# Patient Record
Sex: Female | Born: 1935
Health system: Southern US, Community
[De-identification: ages and names within clinical notes are randomized; demographics above are authoritative.]

## PROBLEM LIST (undated history)

## (undated) DIAGNOSIS — I10 Essential (primary) hypertension: Secondary | ICD-10-CM

## (undated) DIAGNOSIS — S52502A Unspecified fracture of the lower end of left radius, initial encounter for closed fracture: Secondary | ICD-10-CM

## (undated) DIAGNOSIS — H409 Unspecified glaucoma: Secondary | ICD-10-CM

## (undated) DIAGNOSIS — G629 Polyneuropathy, unspecified: Secondary | ICD-10-CM

## (undated) DIAGNOSIS — C50919 Malignant neoplasm of unspecified site of unspecified female breast: Secondary | ICD-10-CM

## (undated) DIAGNOSIS — F0280 Dementia in other diseases classified elsewhere without behavioral disturbance: Secondary | ICD-10-CM

## (undated) DIAGNOSIS — S42202A Unspecified fracture of upper end of left humerus, initial encounter for closed fracture: Secondary | ICD-10-CM

## (undated) DIAGNOSIS — E119 Type 2 diabetes mellitus without complications: Secondary | ICD-10-CM

## (undated) HISTORY — DX: Dementia in other diseases classified elsewhere, unspecified severity, without behavioral disturbance, psychotic disturbance, mood disturbance, and anxiety: F02.80

## (undated) HISTORY — DX: Polyneuropathy, unspecified: G62.9

## (undated) HISTORY — PX: OTHER SURGICAL HISTORY: SHX169

## (undated) HISTORY — DX: Type 2 diabetes mellitus without complications: E11.9

## (undated) HISTORY — PX: MASTECTOMY: SHX3

---
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
## 1999-01-24 ENCOUNTER — Ambulatory Visit (HOSPITAL_COMMUNITY): Admission: RE | Admit: 1999-01-24 | Discharge: 1999-01-24 | Payer: Self-pay

## 1999-02-15 ENCOUNTER — Ambulatory Visit (HOSPITAL_COMMUNITY): Admission: RE | Admit: 1999-02-15 | Discharge: 1999-02-15 | Payer: Self-pay

## 1999-03-25 ENCOUNTER — Ambulatory Visit (HOSPITAL_COMMUNITY): Admission: RE | Admit: 1999-03-25 | Discharge: 1999-03-26 | Payer: Self-pay

## 1999-03-25 ENCOUNTER — Encounter (INDEPENDENT_AMBULATORY_CARE_PROVIDER_SITE_OTHER): Payer: Self-pay | Admitting: Specialist

## 2001-09-27 ENCOUNTER — Ambulatory Visit (HOSPITAL_COMMUNITY): Admission: RE | Admit: 2001-09-27 | Discharge: 2001-09-27 | Payer: Self-pay | Admitting: Orthopedic Surgery

## 2001-09-27 ENCOUNTER — Encounter: Payer: Self-pay | Admitting: Orthopedic Surgery

## 2001-10-22 ENCOUNTER — Ambulatory Visit (HOSPITAL_COMMUNITY): Admission: RE | Admit: 2001-10-22 | Discharge: 2001-10-22 | Payer: Self-pay | Admitting: Orthopedic Surgery

## 2001-10-22 ENCOUNTER — Encounter: Payer: Self-pay | Admitting: Orthopedic Surgery

## 2001-11-26 ENCOUNTER — Encounter (INDEPENDENT_AMBULATORY_CARE_PROVIDER_SITE_OTHER): Payer: Self-pay | Admitting: Specialist

## 2001-11-26 ENCOUNTER — Ambulatory Visit (HOSPITAL_COMMUNITY): Admission: RE | Admit: 2001-11-26 | Discharge: 2001-11-26 | Payer: Self-pay | Admitting: *Deleted

## 2007-07-09 ENCOUNTER — Emergency Department (HOSPITAL_COMMUNITY): Admission: EM | Admit: 2007-07-09 | Discharge: 2007-07-09 | Payer: Self-pay | Admitting: Emergency Medicine

## 2007-08-07 ENCOUNTER — Inpatient Hospital Stay (HOSPITAL_COMMUNITY): Admission: AD | Admit: 2007-08-07 | Discharge: 2007-08-12 | Payer: Self-pay | Admitting: Internal Medicine

## 2008-03-20 ENCOUNTER — Encounter: Admission: RE | Admit: 2008-03-20 | Discharge: 2008-03-20 | Payer: Self-pay | Admitting: Internal Medicine

## 2010-01-25 ENCOUNTER — Encounter: Admission: RE | Admit: 2010-01-25 | Discharge: 2010-01-25 | Payer: Self-pay | Admitting: Internal Medicine

## 2010-08-31 NOTE — Discharge Summary (Signed)
Vickie Young, DELALUZ NO.:  [id number]   MEDICAL RECORD NO.:  [id number]          PATIENT TYPE:  INP   LOCATION:  5501                         FACILITY:  [facility]   PHYSICIAN:  Hillery Aldo, M.D.   DATE OF BIRTH:  January 29, 1924   DATE OF ADMISSION:  08/07/2007  DATE OF DISCHARGE:  08/12/2007                               DISCHARGE SUMMARY   PRIMARY CARE PHYSICIAN:  Dr. Merri Brunette.   GASTROENTEROLOGIST:  Dr. Virginia Rochester.   DISCHARGE DIAGNOSES:  1. Urinary tract infection with sepsis from a urinary source      accompanied by fever, hypotension, acute renal failure, and acute      liver injury.  2. Transaminitis.  3. Acute renal failure.  4. Hyponatremia.  5. Normocytic anemia of chronic disease.  6. Hypokalemia.  7. Diabetes mellitus.  8. History of hypertension.   DISCHARGE MEDICATIONS:  1. Cipro 250 mg b.i.d. x3 more days.  2. Metformin 1000 mg b.i.d.  3. Alphagan 0.2% 1 drop in each eye q.12 h.  4. Cosopt 0.5% 1 drop in each eye q.12 h.  5. Tums 500 mg p.r.n.  6. Multivitamin daily.  7. Micardis 80/12.5 1 tablet daily.  8. Aspirin 81 mg daily.  9. Caduet 10/20 1 tablet daily.  10.Protonix 40 mg daily.  11.Phenergan 25 mg q.6 h. p.r.n.   CONSULTATION:  Dr. Virginia Rochester of gastroenterology.   PROCEDURES AND DIAGNOSTIC STUDIES:  1. Chest x-ray on August 07, 2007, showed chronic lung changes with no      acute process.  2. Abdominal ultrasound on August 08, 2007, showed a 2.2 cm size      echogenic area within the liver adjacent to the porta hepatis.  It      is felt that this likely was representative of the small hemangioma      or small area of focal fatty change, for a bilateral small, simple-      appearing renal cyst.  3. CT scan of the abdomen on August 09, 2007, showed no evidence of      liver mass.  Echogenic area seen on ultrasound was prominent      periportal fat.  Multiple small liver and bilateral renal cysts.      No acute abnormalities.   DISCHARGE  LABORATORY VALUES:  Sodium was 136, potassium 3.9, chloride  104, bicarb 23, BUN 4, creatinine 0.87, and glucose 119.  Total  bilirubin 2.0, alkaline phosphatase 68, AST 133, and ALT 130.   HOSPITAL COURSE:  1. Fever, sepsis from a urinary source with hypotension, acute renal      failure, and acute liver injury:  The patient was admitted with      symptoms of generalized weakness and fever.  Upon general      laboratory screening, she was found to have a significant      transaminitis.  A septic workup was undertaken with chest x-ray,      and urinalysis with culture.  She was not immediately started on      antibiotics due to lack of localizing symptoms.  Blood cultures  were subsequently negative.  Urine cultures were obtained after the      patient received a dose of antibiotics and were sterile.  However,      a urinalysis with microscopy done on admission did show pyuria and      significant bacteriuria.  The patient was given judicious IV fluid      hydration and was empirically started on Rocephin after spiking a      fever to 102.6.  She defervesced and her blood pressure stabilized.      Her transaminitis and her renal function improved dramatically.      She was transitioned over to p.o. Cipro and will complete a full      week's course of therapy given her severity of illness and      concurrent diabetes.  2. Acute liver injury:  The patient's acute liver injury was felt to      be due to shock liver.  Nevertheless, Dr. Virginia Rochester was consulted due to      the significant elevation of her transaminases.  Her AST reached a      high of 569 and her ALT reached a high of 254.  Viral hepatitis      studies were negative.  An ANA was sent to rule out autoimmune      hepatitis and is pending at the time of this dictation, but due to      the fact that her LFTs are significantly improved, this is not felt      to be due to autoimmune hepatitis.  Nevertheless, she should have a       repeat check of her liver function studies as an outpatient to      ensure that these have completely normalized.  Additionally, a mono      spot screen was negative, ammonia studies were not elevated, and a      GGT was only mildly elevated at 54.  Her coagulation studies were      normal.  3. Acute renal failure secondary to dehydration, hypotension and      shock:  The patient received judicious IV fluid rehydration.  Her      creatinine was 1.56 on admission and has normalized to 0.87 at      discharge.  4. Hyponatremia:  The patient's sodium balance normalized at the time      of discharge.  5. Normocytic anemia:  The patient had fecal occult blood testing      which was negative.  She had an anemia panel done and the findings      were consistent with anemia of chronic disease.  Specifically, her      iron level was 33 with a total iron binding capacity of 237.      Vitamin B12 was 550, serum folate greater than 20, and ferritin      3044.  6. Hypokalemia:  The patient was appropriately repleted.   DISPOSITION:  The patient is medically stable and will be discharged  home today.  Again, she should follow up with her primary care physician  for a recheck of her liver function studies next week.  She should  follow up with Dr. Virginia Rochester for routine GI issues.      Hillery Aldo, M.D.  Electronically Signed     CR/MEDQ  D:  08/12/2007  T:  08/13/2007  Job:  [id number]   cc:   Soyla Murphy. Renne Crigler, M.D.  Georgiana Spinner, M.D.

## 2010-08-31 NOTE — H&P (Signed)
NAMESHANDRICKA, MONROY NO.:  [id number]   MEDICAL RECORD NO.:  [id number]          PATIENT TYPE:  INP   LOCATION:  5501                         FACILITY:  [facility]   PHYSICIAN:  Hillery Aldo, M.D.   DATE OF BIRTH:  12-Jul-1923   DATE OF ADMISSION:  08/07/2007  DATE OF DISCHARGE:                              HISTORY & PHYSICAL   PRIMARY CARE PHYSICIAN:  Dr. Renne Crigler   CHIEF COMPLAINT:  Weakness, fever, nausea, vomiting.   HISTORY OF PRESENT ILLNESS:  The patient is an 78 year old female sent  from her primary care physician's office today secondary to weakness,  recent onset of fever, nausea, vomiting, and dehydration.  The patient  reports a 3- to 4-day history of intermittent shaking chills and fever.  She also has had intermittent nausea and vomiting.  She thinks she may  have had some diarrhea, but did not specifically look in the toilet.  Denies any dyspnea or cough.  There has not been any abdominal cramping.  There has not been any dysuria.  She denies any sick contacts.  She was  admitted for further evaluation and observation.   PAST MEDICAL HISTORY:  1. Parathyroid adenoma on the left status post resection secondary to      hyperparathyroidism.  2. Hypertension.  3. Diabetes.  4. History of breast cancer status post mastectomy.  5. Cataracts.  6. Hearing impaired.  7. Status post hysterectomy.  8. Glaucoma.   CURRENT MEDICATIONS:  1. Caduet 10/20 1 tablet daily.  2. Metformin 1000 mg b.i.d.  3. Micardis 80/12.5 daily.  4. Aspirin 81 mg daily.  5. Combigan 1 drop both eyes b.i.d.  6. Phenergan 25 mg q.4h. p.r.n.  7. Multivitamin daily.  8. Tums p.r.n.  9. Protonix 40 mg daily.   ALLERGIES:  NOVOCAINE.   SOCIAL HISTORY:  The patient is widowed and currently lives with one of  her daughters.  She ambulates independently.  She is independent of  ADLs.  She has a remote history of tobacco use, quit over 30 years ago.  Denies alcohol use.   FAMILY HISTORY:  The patient's mother died of coronary artery disease.  The patient's father died of stroke.   REVIEW OF SYSTEMS:  As noted in the elements of the HPI above, otherwise  negative.   PHYSICAL EXAM:  VITAL SIGHS: Temperature 98.7, blood pressure 97/59,  pulse 74, respirations 18, O2 saturation 92% on room air.  GENERAL: This is a well-developed, well-nourished female, in no acute  distress.  HEENT: Normocephalic and atraumatic.  PERRL.  EOMI.  Oropharynx reveals  dry mucous membranes.  NECK: Supple, no thyromegaly, no lymphadenopathy, jugular venous  distention.  CHEST: Lungs clear to auscultation bilaterally with good air movement.  HEART:  Regular rate, rhythm.  No murmurs, rubs, or gallops.  ABDOMEN:  Soft, nontender, nondistended with normoactive bowel sounds.  EXTREMITIES:  No clubbing, edema, or cyanosis.  SKIN:  Warm and dry.  No rashes.  NEUROLOGIC:  The patient is hearing impaired, but otherwise cranial  nerves II through XII are grossly intact.  Moves all extremities x4 with  equal  strength.  Nonfocal.   DATA REVIEW:  White blood cell count is 5.4, hemoglobin 10.9, hematocrit  32.7, platelets 176 with an MCV of 84.5.  Sodium is 130, potassium 3.9,  chloride 95, bicarb 26, BUN 22, creatinine 1.56, glucose 191.  Total  bilirubin 1.1, alkaline phosphatase 68, AST 247, ALT 111, total protein  7, albumin 3.4.   Chest x-ray is pending.   ASSESSMENT/PLAN:  1. Weakness febrile illness:  The patient will be admitted and blood      cultures x2 will be obtained for any recurrent fevers.  Urinalysis      has been sent.  Chest x-ray is pending.  This likely is just a      simple viral gastroenteritis, but she does have elevation of her      liver function studies, and therefore, we will add a lipase, and a      right upper quadrant ultrasound and hepatitis studies to her      evaluation.  2. Dehydration/acute renal failure/hyponatremia:  The patient is      likely  prerenal and suffering from mild dehydration.  We will      hydrate her with normal saline.  Follow her electrolytes closely as      well as her renal function closely.  3. Diabetes:  We will initiate sliding scale insulin q.a.c. and      nightly.  We will check hemoglobin A1c values to determine her      overall glycemic control.  4. Hypertension:  The patient is currently hypotensive.  We will hold      all of her blood pressure medications for now.  5. Cataract/glaucoma:  Continue the patient's usual eye drop therapy.  6. Normocytic anemia:  This is mild, but her hemoglobin may drop      further with rehydration.  We will monitor her hemoglobin and      hematocrit and heme-check her stools as well as check an anemia      panel.  7. Prophylaxis:  Initiate PAS for deep venous thrombosis prophylaxis      and Protonix for GI prophylaxis.      Hillery Aldo, M.D.  Electronically Signed     CR/MEDQ  D:  08/07/2007  T:  08/08/2007  Job:  [id number]

## 2010-09-03 NOTE — Op Note (Signed)
   Vickie Young, Vickie Young                       ACCOUNT NO.:  [id number]   MEDICAL RECORD NO.:  [id number]                   PATIENT TYPE:  AMB   LOCATION:  ENDO                                 FACILITY:  [facility]   PHYSICIAN:  Georgiana Spinner, M.D.                 DATE OF BIRTH:  Jun 29, 1923   DATE OF PROCEDURE:  11/26/2001  DATE OF DISCHARGE:  11/26/2001                                 OPERATIVE REPORT   PROCEDURE:  Colonoscopy.   INDICATIONS:  Colon cancer screening.   ANESTHESIA:  Versed 1 mg.   DESCRIPTION OF PROCEDURE:  With the patient mildly sedated in the left  lateral decubitus position, the Olympus videoscopic colonoscope was inserted  in the rectum, advanced under direct vision to the cecum, identified by the  ileocecal valve and appendiceal orifice, both of which were photographed.  From this point the colonoscope was slowly withdrawn, taking circumferential  views of the entire colonic mucosa, stopping only in the rectum, which  appeared normal on direct and retroflex view.  The endoscope was  straightened and withdrawn.  The patient's vital signs and pulse oximetry  remained stable.  The patient tolerated the procedure well with no apparent  complications.   FINDINGS:  Unremarkable colonoscopic examination.   PLAN:  See endoscopy note for further details.                                               Georgiana Spinner, M.D.    GMO/MEDQ  D:  11/26/2001  T:  11/28/2001  Job:  [id number]

## 2010-09-03 NOTE — Op Note (Signed)
   Vickie Young, Vickie Young                       ACCOUNT NO.:  [id number]   MEDICAL RECORD NO.:  [id number]                   PATIENT TYPE:  AMB   LOCATION:  ENDO                                 FACILITY:  [facility]   PHYSICIAN:  Georgiana Spinner, M.D.                 DATE OF BIRTH:  [birth year]   DATE OF PROCEDURE:  DATE OF DISCHARGE:  11/26/2001                                 OPERATIVE REPORT   PROCEDURE PERFORMED:  Upper endoscopy   INDICATIONS FOR PROCEDURE:  GERD.   ANESTHESIA:  Demerol 40, Versed 4 mg   DESCRIPTION OF PROCEDURE:  With the patient mildly sedated in the left  lateral decubitus position, the Olympus videoscopic endoscope was inserted  into the mouth and passed under direct vision through the esophagus which  appeared normal into the stomach through a hiatal hernia.  The fundus, body,  antrum, duodenal bulb and second portion of the duodenum were negative.  From this point, the endoscope was slightly withdrawn taking circumferential  views of the duodenal mucosa until the endoscope then pulled back into the  stomach, placed in retroflexion to view the stomach from below.  The  endoscope was straightened and withdrawn.  Taking circumferential views of  the remaining gastric and esophageal mucosa.  The patient's vital signs and  pulse oximetry remained stable.  The patient tolerated the procedure well  without apparent complications following this hiatal hernia; otherwise  unremarkable exam.   PLAN:  Proceed to colonoscopy.                                                Georgiana Spinner, M.D.    GMO/MEDQ  D:  11/26/2001  T:  11/28/2001  Job:  [id number]

## 2010-09-03 NOTE — Op Note (Signed)
[place]  Patient:    Vickie Young                     MRN: [id number] Proc. Date: 03/25/99 Adm. Date:  [id number] Attending:  Gennie Alma CC:         Soyla Murphy. Renne Crigler, M.D.                           Operative Report  CCS NUMBER:  [id number].  PREOPERATIVE DIAGNOSIS:  Parathyroid adenoma of the left superior gland.  POSTOPERATIVE DIAGNOSIS:  Parathyroid adenoma of the left superior gland.  OPERATION:  Nuclear mapping of parathyroid glands and bilateral exploration of he neck with resection of the parathyroid adenoma of the left superior gland.  SURGEON:  Milus Mallick, M.D.  ASSISTANTS: 1. Abigail Miyamoto, M.D. 2. Patricia Pesa, M.D.  INDICATION:  This 77 year old patient had symptomatic primary hyperparathyroidism and was known to have a serum parathyroid hormone level of 139 (normal being 12 to 72) with a calcium of 11.2 mg%.  She had undergone a MRI scan of her neck which suggested that she had a parathyroid adenoma of her right inferior gland.  She also had undergone ______ scan of the  neck that suggested that she had a parathyroid adenoma of the left superior gland. With these ambiguous findings, it was thought that nuclear mapping of the neck would help to discern where this lesion really was.  The patient had received injection of ______ agent approximately two hours prior to surgery.  DESCRIPTION OF PROCEDURE:  Under adequate general endotracheal anesthesia and with the neck extended on a shoulder roll, the neck was prepared and draped in the usual fashion.  Nuclear mapping of the parathyroid gland was carried out using the gamma neoprobe. The findings with the probe were not very indicative of the adenoma as in usual  cases.  The background counts were approximately 300 to 350 and the counts in the left upper quadrant were 350 to 400.  With the ambiguous findings of the MRI and the ______  scan, it  was felt prudent to explore both sides of the neck.  For this reason, a low collar incision was made.  Bleeders were electrocoagulated.  The platysma muscle was divided. Superior and inferior skin flaps were developed subjacent to the platysma muscle.  The neural cervical fascia was exposed and opened longitudinally in the midline. The surgical plane of the thyroid was entered.  The left side was approached first nd the normal left inferior parathyroid gland was found.  The superior thyroid gland was not initially identified.  We then explored the right thyroid lobe and the strap muscles were retracted. There was a normal-appearing right parathyroid gland associated with the inferior pole of the right lobe of the thyroid.  The right superior gland was not found.  The right recurrent laryngeal nerve was identified and the inferior thyroid artery. The superior mediastinum was well explored and there was no evidence of parathyroid adenoma in that area although there was thymus gland that might have been the source of the findings on the previous MRI scan.  We once again did nuclear mapping of the neck and this time we did find a modest increase in activity over the left superior pole of the thyroid gland. Dissection posterior and lateral identified the superior parathyroid adenoma that was somewhat intrathyroidal.  It was dissected out of the thyroid gland  over small hemoclips and in some instances large hemoclips.  The left middle thyroid vein was divided over clips and the left inferior thyroid artery and left recurrent laryngeal nerve were all identified.  Specimen was excised and was approximately 1.8 by 1.0 by 0.2 cm.  It was sent for frozen section study which confirmed parathyroid hyperplasia.  Hemostasis was ascertained.  The strap muscles were repaired in the midline with interrupted sutures of 4-0 Vicryl.  The platysma muscle was reapproximated with  interrupted  sutures of 4-0 Vicryl and the skin was approximated with generic skin stapler 35-W.  Sterile dressings were applied.  ESTIMATED BLOOD LOSS:  Negligible.  COMPLICATIONS:  The patient tolerated the procedure well and left the operating  room in satisfactory condition. DD:  03/25/99 TD:  03/26/99 Job: [id number]

## 2011-01-11 LAB — AMMONIA: Ammonia: 44 — ABNORMAL HIGH

## 2011-01-11 LAB — MONONUCLEOSIS SCREEN: Mono Screen: NEGATIVE

## 2011-01-11 LAB — CBC
HCT: 25.7 — ABNORMAL LOW
HCT: 32.7 — ABNORMAL LOW
Hemoglobin: 10.5 — ABNORMAL LOW
Hemoglobin: 10.9 — ABNORMAL LOW
Hemoglobin: 9.4 — ABNORMAL LOW
MCHC: 33.2
Platelets: 240
RBC: 3.05 — ABNORMAL LOW
RBC: 3.34 — ABNORMAL LOW
RDW: 12.2
RDW: 12.3
WBC: 5.4
WBC: 6.2

## 2011-01-11 LAB — COMPREHENSIVE METABOLIC PANEL
ALT: 111 — ABNORMAL HIGH
ALT: 131 — ABNORMAL HIGH
ALT: 247 — ABNORMAL HIGH
ALT: 254 — ABNORMAL HIGH
AST: 226 — ABNORMAL HIGH
Albumin: 2.7 — ABNORMAL LOW
Albumin: 3.4 — ABNORMAL LOW
Alkaline Phosphatase: 58
Alkaline Phosphatase: 61
Alkaline Phosphatase: 68
Alkaline Phosphatase: 68
BUN: 7
CO2: 23
CO2: 24
Calcium: 9.1
Chloride: 104
Chloride: 104
Chloride: 95 — ABNORMAL LOW
Chloride: 98
GFR calc non Af Amer: 44 — ABNORMAL LOW
GFR calc non Af Amer: >60
Glucose, Bld: 119 — ABNORMAL HIGH
Glucose, Bld: 157 — ABNORMAL HIGH
Glucose, Bld: 173 — ABNORMAL HIGH
Glucose, Bld: 191 — ABNORMAL HIGH
Potassium: 3.2 — ABNORMAL LOW
Potassium: 3.9
Potassium: 3.9
Potassium: 4.1
Sodium: 130 — ABNORMAL LOW
Sodium: 131 — ABNORMAL LOW
Sodium: 132 — ABNORMAL LOW
Sodium: 136
Total Bilirubin: 1.1
Total Bilirubin: 1.7 — ABNORMAL HIGH
Total Protein: 6.8
Total Protein: 7

## 2011-01-11 LAB — HEPATITIS PANEL, ACUTE
HCV Ab: NEGATIVE
Hepatitis B Surface Ag: NEGATIVE

## 2011-01-11 LAB — URINE MICROSCOPIC-ADD ON

## 2011-01-11 LAB — FOLATE: Folate: >20

## 2011-01-11 LAB — URINALYSIS, ROUTINE W REFLEX MICROSCOPIC
Ketones, ur: NEGATIVE
Nitrite: NEGATIVE
Specific Gravity, Urine: 1.019
pH: 5

## 2011-01-11 LAB — DIFFERENTIAL
Basophils Absolute: 0
Basophils Relative: 0
Eosinophils Absolute: 0
Monocytes Absolute: 0.3
Monocytes Relative: 5
Neutrophils Relative %: 68

## 2011-01-11 LAB — STOOL CULTURE

## 2011-01-11 LAB — CMV ABS, IGG+IGM (CYTOMEGALOVIRUS): CMV IgM: <0.9 Index (ref <0.90)

## 2011-01-11 LAB — ANA: Anti Nuclear Antibody(ANA): NEGATIVE

## 2011-01-11 LAB — URINE CULTURE: Culture: NO GROWTH

## 2011-01-11 LAB — CULTURE, BLOOD (ROUTINE X 2)

## 2011-01-11 LAB — GAMMA GT: GGT: 54 — ABNORMAL HIGH

## 2011-01-11 LAB — FERRITIN: Ferritin: 3044 — ABNORMAL HIGH (ref 10–291)

## 2011-01-11 LAB — RETICULOCYTES: Retic Count, Absolute: 24.1

## 2011-05-26 DIAGNOSIS — E119 Type 2 diabetes mellitus without complications: Secondary | ICD-10-CM | POA: Diagnosis not present

## 2011-05-26 DIAGNOSIS — E7800 Pure hypercholesterolemia, unspecified: Secondary | ICD-10-CM | POA: Diagnosis not present

## 2011-05-30 DIAGNOSIS — E119 Type 2 diabetes mellitus without complications: Secondary | ICD-10-CM | POA: Diagnosis not present

## 2011-05-30 DIAGNOSIS — E7800 Pure hypercholesterolemia, unspecified: Secondary | ICD-10-CM | POA: Diagnosis not present

## 2011-05-30 DIAGNOSIS — I10 Essential (primary) hypertension: Secondary | ICD-10-CM | POA: Diagnosis not present

## 2011-07-25 DIAGNOSIS — H409 Unspecified glaucoma: Secondary | ICD-10-CM | POA: Diagnosis not present

## 2011-07-25 DIAGNOSIS — H4011X0 Primary open-angle glaucoma, stage unspecified: Secondary | ICD-10-CM | POA: Diagnosis not present

## 2011-08-25 DIAGNOSIS — E119 Type 2 diabetes mellitus without complications: Secondary | ICD-10-CM | POA: Diagnosis not present

## 2011-08-25 DIAGNOSIS — E7800 Pure hypercholesterolemia, unspecified: Secondary | ICD-10-CM | POA: Diagnosis not present

## 2011-08-29 DIAGNOSIS — I10 Essential (primary) hypertension: Secondary | ICD-10-CM | POA: Diagnosis not present

## 2011-08-29 DIAGNOSIS — E119 Type 2 diabetes mellitus without complications: Secondary | ICD-10-CM | POA: Diagnosis not present

## 2011-08-29 DIAGNOSIS — E7800 Pure hypercholesterolemia, unspecified: Secondary | ICD-10-CM | POA: Diagnosis not present

## 2011-12-09 DIAGNOSIS — E119 Type 2 diabetes mellitus without complications: Secondary | ICD-10-CM | POA: Diagnosis not present

## 2011-12-09 DIAGNOSIS — E7800 Pure hypercholesterolemia, unspecified: Secondary | ICD-10-CM | POA: Diagnosis not present

## 2011-12-13 DIAGNOSIS — E7800 Pure hypercholesterolemia, unspecified: Secondary | ICD-10-CM | POA: Diagnosis not present

## 2011-12-13 DIAGNOSIS — I10 Essential (primary) hypertension: Secondary | ICD-10-CM | POA: Diagnosis not present

## 2011-12-13 DIAGNOSIS — E1149 Type 2 diabetes mellitus with other diabetic neurological complication: Secondary | ICD-10-CM | POA: Diagnosis not present

## 2011-12-15 DIAGNOSIS — H4011X0 Primary open-angle glaucoma, stage unspecified: Secondary | ICD-10-CM | POA: Diagnosis not present

## 2012-01-11 DIAGNOSIS — E7800 Pure hypercholesterolemia, unspecified: Secondary | ICD-10-CM | POA: Diagnosis not present

## 2012-01-11 DIAGNOSIS — E1149 Type 2 diabetes mellitus with other diabetic neurological complication: Secondary | ICD-10-CM | POA: Diagnosis not present

## 2012-01-11 DIAGNOSIS — I10 Essential (primary) hypertension: Secondary | ICD-10-CM | POA: Diagnosis not present

## 2012-01-11 DIAGNOSIS — N390 Urinary tract infection, site not specified: Secondary | ICD-10-CM | POA: Diagnosis not present

## 2012-01-11 DIAGNOSIS — Z0000 Encounter for general adult medical examination without abnormal findings: Secondary | ICD-10-CM | POA: Diagnosis not present

## 2012-01-16 DIAGNOSIS — Z0000 Encounter for general adult medical examination without abnormal findings: Secondary | ICD-10-CM | POA: Diagnosis not present

## 2012-01-16 DIAGNOSIS — I10 Essential (primary) hypertension: Secondary | ICD-10-CM | POA: Diagnosis not present

## 2012-01-16 DIAGNOSIS — Z23 Encounter for immunization: Secondary | ICD-10-CM | POA: Diagnosis not present

## 2012-01-16 DIAGNOSIS — H409 Unspecified glaucoma: Secondary | ICD-10-CM | POA: Diagnosis not present

## 2012-01-16 DIAGNOSIS — E1149 Type 2 diabetes mellitus with other diabetic neurological complication: Secondary | ICD-10-CM | POA: Diagnosis not present

## 2012-01-16 DIAGNOSIS — E7800 Pure hypercholesterolemia, unspecified: Secondary | ICD-10-CM | POA: Diagnosis not present

## 2012-01-25 DIAGNOSIS — R21 Rash and other nonspecific skin eruption: Secondary | ICD-10-CM | POA: Diagnosis not present

## 2012-01-25 DIAGNOSIS — R5381 Other malaise: Secondary | ICD-10-CM | POA: Diagnosis not present

## 2012-01-25 DIAGNOSIS — R509 Fever, unspecified: Secondary | ICD-10-CM | POA: Diagnosis not present

## 2012-01-25 DIAGNOSIS — L299 Pruritus, unspecified: Secondary | ICD-10-CM | POA: Diagnosis not present

## 2012-01-25 DIAGNOSIS — R5383 Other fatigue: Secondary | ICD-10-CM | POA: Diagnosis not present

## 2012-03-05 DIAGNOSIS — E119 Type 2 diabetes mellitus without complications: Secondary | ICD-10-CM | POA: Diagnosis not present

## 2012-03-05 DIAGNOSIS — R609 Edema, unspecified: Secondary | ICD-10-CM | POA: Diagnosis not present

## 2012-03-05 DIAGNOSIS — I10 Essential (primary) hypertension: Secondary | ICD-10-CM | POA: Diagnosis not present

## 2012-03-05 DIAGNOSIS — H9190 Unspecified hearing loss, unspecified ear: Secondary | ICD-10-CM | POA: Diagnosis not present

## 2012-03-05 DIAGNOSIS — R5381 Other malaise: Secondary | ICD-10-CM | POA: Diagnosis not present

## 2012-03-23 DIAGNOSIS — E7800 Pure hypercholesterolemia, unspecified: Secondary | ICD-10-CM | POA: Diagnosis not present

## 2012-03-23 DIAGNOSIS — E1149 Type 2 diabetes mellitus with other diabetic neurological complication: Secondary | ICD-10-CM | POA: Diagnosis not present

## 2012-03-29 DIAGNOSIS — E1149 Type 2 diabetes mellitus with other diabetic neurological complication: Secondary | ICD-10-CM | POA: Diagnosis not present

## 2012-03-29 DIAGNOSIS — E7800 Pure hypercholesterolemia, unspecified: Secondary | ICD-10-CM | POA: Diagnosis not present

## 2012-03-29 DIAGNOSIS — I10 Essential (primary) hypertension: Secondary | ICD-10-CM | POA: Diagnosis not present

## 2012-04-12 DIAGNOSIS — H43819 Vitreous degeneration, unspecified eye: Secondary | ICD-10-CM | POA: Diagnosis not present

## 2012-04-12 DIAGNOSIS — H524 Presbyopia: Secondary | ICD-10-CM | POA: Diagnosis not present

## 2012-04-12 DIAGNOSIS — Z961 Presence of intraocular lens: Secondary | ICD-10-CM | POA: Diagnosis not present

## 2012-04-12 DIAGNOSIS — H4011X0 Primary open-angle glaucoma, stage unspecified: Secondary | ICD-10-CM | POA: Diagnosis not present

## 2012-07-02 DIAGNOSIS — E1149 Type 2 diabetes mellitus with other diabetic neurological complication: Secondary | ICD-10-CM | POA: Diagnosis not present

## 2012-08-02 DIAGNOSIS — E1149 Type 2 diabetes mellitus with other diabetic neurological complication: Secondary | ICD-10-CM | POA: Diagnosis not present

## 2012-08-06 DIAGNOSIS — E1149 Type 2 diabetes mellitus with other diabetic neurological complication: Secondary | ICD-10-CM | POA: Diagnosis not present

## 2012-08-06 DIAGNOSIS — E7800 Pure hypercholesterolemia, unspecified: Secondary | ICD-10-CM | POA: Diagnosis not present

## 2012-08-06 DIAGNOSIS — I10 Essential (primary) hypertension: Secondary | ICD-10-CM | POA: Diagnosis not present

## 2012-08-09 DIAGNOSIS — H409 Unspecified glaucoma: Secondary | ICD-10-CM | POA: Diagnosis not present

## 2012-08-09 DIAGNOSIS — H4011X0 Primary open-angle glaucoma, stage unspecified: Secondary | ICD-10-CM | POA: Diagnosis not present

## 2012-10-04 DIAGNOSIS — E1149 Type 2 diabetes mellitus with other diabetic neurological complication: Secondary | ICD-10-CM | POA: Diagnosis not present

## 2012-10-04 DIAGNOSIS — E7800 Pure hypercholesterolemia, unspecified: Secondary | ICD-10-CM | POA: Diagnosis not present

## 2012-10-04 DIAGNOSIS — I10 Essential (primary) hypertension: Secondary | ICD-10-CM | POA: Diagnosis not present

## 2012-10-08 DIAGNOSIS — E119 Type 2 diabetes mellitus without complications: Secondary | ICD-10-CM | POA: Diagnosis not present

## 2012-10-08 DIAGNOSIS — E7800 Pure hypercholesterolemia, unspecified: Secondary | ICD-10-CM | POA: Diagnosis not present

## 2012-10-08 DIAGNOSIS — I10 Essential (primary) hypertension: Secondary | ICD-10-CM | POA: Diagnosis not present

## 2012-10-08 DIAGNOSIS — E1149 Type 2 diabetes mellitus with other diabetic neurological complication: Secondary | ICD-10-CM | POA: Diagnosis not present

## 2012-10-08 DIAGNOSIS — Z006 Encounter for examination for normal comparison and control in clinical research program: Secondary | ICD-10-CM | POA: Diagnosis not present

## 2013-01-08 DIAGNOSIS — E1149 Type 2 diabetes mellitus with other diabetic neurological complication: Secondary | ICD-10-CM | POA: Diagnosis not present

## 2013-01-08 DIAGNOSIS — E7800 Pure hypercholesterolemia, unspecified: Secondary | ICD-10-CM | POA: Diagnosis not present

## 2013-01-10 DIAGNOSIS — E1149 Type 2 diabetes mellitus with other diabetic neurological complication: Secondary | ICD-10-CM | POA: Diagnosis not present

## 2013-01-10 DIAGNOSIS — E7800 Pure hypercholesterolemia, unspecified: Secondary | ICD-10-CM | POA: Diagnosis not present

## 2013-01-10 DIAGNOSIS — I10 Essential (primary) hypertension: Secondary | ICD-10-CM | POA: Diagnosis not present

## 2013-01-10 DIAGNOSIS — Z23 Encounter for immunization: Secondary | ICD-10-CM | POA: Diagnosis not present

## 2013-01-16 DIAGNOSIS — I10 Essential (primary) hypertension: Secondary | ICD-10-CM | POA: Diagnosis not present

## 2013-01-22 DIAGNOSIS — Z0000 Encounter for general adult medical examination without abnormal findings: Secondary | ICD-10-CM | POA: Diagnosis not present

## 2013-01-22 DIAGNOSIS — Z853 Personal history of malignant neoplasm of breast: Secondary | ICD-10-CM | POA: Diagnosis not present

## 2013-01-22 DIAGNOSIS — I10 Essential (primary) hypertension: Secondary | ICD-10-CM | POA: Diagnosis not present

## 2013-01-22 DIAGNOSIS — E1149 Type 2 diabetes mellitus with other diabetic neurological complication: Secondary | ICD-10-CM | POA: Diagnosis not present

## 2013-01-22 DIAGNOSIS — E7800 Pure hypercholesterolemia, unspecified: Secondary | ICD-10-CM | POA: Diagnosis not present

## 2013-05-02 DIAGNOSIS — E7800 Pure hypercholesterolemia, unspecified: Secondary | ICD-10-CM | POA: Diagnosis not present

## 2013-05-02 DIAGNOSIS — E1149 Type 2 diabetes mellitus with other diabetic neurological complication: Secondary | ICD-10-CM | POA: Diagnosis not present

## 2013-05-07 DIAGNOSIS — E1149 Type 2 diabetes mellitus with other diabetic neurological complication: Secondary | ICD-10-CM | POA: Diagnosis not present

## 2013-05-07 DIAGNOSIS — I10 Essential (primary) hypertension: Secondary | ICD-10-CM | POA: Diagnosis not present

## 2013-05-07 DIAGNOSIS — E7800 Pure hypercholesterolemia, unspecified: Secondary | ICD-10-CM | POA: Diagnosis not present

## 2013-08-02 DIAGNOSIS — E7800 Pure hypercholesterolemia, unspecified: Secondary | ICD-10-CM | POA: Diagnosis not present

## 2013-08-02 DIAGNOSIS — E1149 Type 2 diabetes mellitus with other diabetic neurological complication: Secondary | ICD-10-CM | POA: Diagnosis not present

## 2013-08-06 DIAGNOSIS — I10 Essential (primary) hypertension: Secondary | ICD-10-CM | POA: Diagnosis not present

## 2013-08-06 DIAGNOSIS — E7800 Pure hypercholesterolemia, unspecified: Secondary | ICD-10-CM | POA: Diagnosis not present

## 2013-08-06 DIAGNOSIS — E1149 Type 2 diabetes mellitus with other diabetic neurological complication: Secondary | ICD-10-CM | POA: Diagnosis not present

## 2013-08-13 DIAGNOSIS — H4011X0 Primary open-angle glaucoma, stage unspecified: Secondary | ICD-10-CM | POA: Diagnosis not present

## 2013-08-13 DIAGNOSIS — H409 Unspecified glaucoma: Secondary | ICD-10-CM | POA: Diagnosis not present

## 2013-09-06 DIAGNOSIS — H409 Unspecified glaucoma: Secondary | ICD-10-CM | POA: Diagnosis not present

## 2013-09-06 DIAGNOSIS — H4011X0 Primary open-angle glaucoma, stage unspecified: Secondary | ICD-10-CM | POA: Diagnosis not present

## 2013-09-06 DIAGNOSIS — E119 Type 2 diabetes mellitus without complications: Secondary | ICD-10-CM | POA: Diagnosis not present

## 2013-09-06 DIAGNOSIS — H524 Presbyopia: Secondary | ICD-10-CM | POA: Diagnosis not present

## 2013-10-31 DIAGNOSIS — E1149 Type 2 diabetes mellitus with other diabetic neurological complication: Secondary | ICD-10-CM | POA: Diagnosis not present

## 2013-11-05 DIAGNOSIS — E119 Type 2 diabetes mellitus without complications: Secondary | ICD-10-CM | POA: Diagnosis not present

## 2013-11-05 DIAGNOSIS — I10 Essential (primary) hypertension: Secondary | ICD-10-CM | POA: Diagnosis not present

## 2013-11-05 DIAGNOSIS — E7800 Pure hypercholesterolemia, unspecified: Secondary | ICD-10-CM | POA: Diagnosis not present

## 2013-11-07 DIAGNOSIS — H4011X0 Primary open-angle glaucoma, stage unspecified: Secondary | ICD-10-CM | POA: Diagnosis not present

## 2013-11-07 DIAGNOSIS — H409 Unspecified glaucoma: Secondary | ICD-10-CM | POA: Diagnosis not present

## 2013-12-12 DIAGNOSIS — H4011X0 Primary open-angle glaucoma, stage unspecified: Secondary | ICD-10-CM | POA: Diagnosis not present

## 2014-02-04 DIAGNOSIS — E119 Type 2 diabetes mellitus without complications: Secondary | ICD-10-CM | POA: Diagnosis not present

## 2014-02-04 DIAGNOSIS — E780 Pure hypercholesterolemia: Secondary | ICD-10-CM | POA: Diagnosis not present

## 2014-02-04 DIAGNOSIS — I10 Essential (primary) hypertension: Secondary | ICD-10-CM | POA: Diagnosis not present

## 2014-02-06 DIAGNOSIS — I10 Essential (primary) hypertension: Secondary | ICD-10-CM | POA: Diagnosis not present

## 2014-02-06 DIAGNOSIS — E780 Pure hypercholesterolemia: Secondary | ICD-10-CM | POA: Diagnosis not present

## 2014-02-06 DIAGNOSIS — Z23 Encounter for immunization: Secondary | ICD-10-CM | POA: Diagnosis not present

## 2014-02-06 DIAGNOSIS — E119 Type 2 diabetes mellitus without complications: Secondary | ICD-10-CM | POA: Diagnosis not present

## 2014-02-11 DIAGNOSIS — H409 Unspecified glaucoma: Secondary | ICD-10-CM | POA: Diagnosis not present

## 2014-02-11 DIAGNOSIS — H4011X0 Primary open-angle glaucoma, stage unspecified: Secondary | ICD-10-CM | POA: Diagnosis not present

## 2014-02-14 DIAGNOSIS — L309 Dermatitis, unspecified: Secondary | ICD-10-CM | POA: Diagnosis not present

## 2014-02-14 DIAGNOSIS — I10 Essential (primary) hypertension: Secondary | ICD-10-CM | POA: Diagnosis not present

## 2014-02-14 DIAGNOSIS — Z0000 Encounter for general adult medical examination without abnormal findings: Secondary | ICD-10-CM | POA: Diagnosis not present

## 2014-02-14 DIAGNOSIS — E780 Pure hypercholesterolemia: Secondary | ICD-10-CM | POA: Diagnosis not present

## 2014-02-14 DIAGNOSIS — E119 Type 2 diabetes mellitus without complications: Secondary | ICD-10-CM | POA: Diagnosis not present

## 2014-02-14 DIAGNOSIS — E118 Type 2 diabetes mellitus with unspecified complications: Secondary | ICD-10-CM | POA: Diagnosis not present

## 2014-03-06 DIAGNOSIS — H4011X1 Primary open-angle glaucoma, mild stage: Secondary | ICD-10-CM | POA: Diagnosis not present

## 2014-03-06 DIAGNOSIS — H4011X3 Primary open-angle glaucoma, severe stage: Secondary | ICD-10-CM | POA: Diagnosis not present

## 2014-04-03 DIAGNOSIS — H4011X3 Primary open-angle glaucoma, severe stage: Secondary | ICD-10-CM | POA: Diagnosis not present

## 2014-04-03 DIAGNOSIS — H4011X1 Primary open-angle glaucoma, mild stage: Secondary | ICD-10-CM | POA: Diagnosis not present

## 2014-04-21 DIAGNOSIS — H4011X1 Primary open-angle glaucoma, mild stage: Secondary | ICD-10-CM | POA: Diagnosis not present

## 2014-05-22 DIAGNOSIS — E119 Type 2 diabetes mellitus without complications: Secondary | ICD-10-CM | POA: Diagnosis not present

## 2014-05-26 DIAGNOSIS — I10 Essential (primary) hypertension: Secondary | ICD-10-CM | POA: Diagnosis not present

## 2014-05-26 DIAGNOSIS — E559 Vitamin D deficiency, unspecified: Secondary | ICD-10-CM | POA: Diagnosis not present

## 2014-05-26 DIAGNOSIS — E119 Type 2 diabetes mellitus without complications: Secondary | ICD-10-CM | POA: Diagnosis not present

## 2014-05-26 DIAGNOSIS — E780 Pure hypercholesterolemia: Secondary | ICD-10-CM | POA: Diagnosis not present

## 2014-06-23 DIAGNOSIS — H4011X3 Primary open-angle glaucoma, severe stage: Secondary | ICD-10-CM | POA: Diagnosis not present

## 2014-06-23 DIAGNOSIS — H4011X1 Primary open-angle glaucoma, mild stage: Secondary | ICD-10-CM | POA: Diagnosis not present

## 2014-08-21 DIAGNOSIS — I10 Essential (primary) hypertension: Secondary | ICD-10-CM | POA: Diagnosis not present

## 2014-08-21 DIAGNOSIS — E559 Vitamin D deficiency, unspecified: Secondary | ICD-10-CM | POA: Diagnosis not present

## 2014-08-21 DIAGNOSIS — E119 Type 2 diabetes mellitus without complications: Secondary | ICD-10-CM | POA: Diagnosis not present

## 2014-08-21 DIAGNOSIS — E780 Pure hypercholesterolemia: Secondary | ICD-10-CM | POA: Diagnosis not present

## 2014-08-28 DIAGNOSIS — E780 Pure hypercholesterolemia: Secondary | ICD-10-CM | POA: Diagnosis not present

## 2014-08-28 DIAGNOSIS — E119 Type 2 diabetes mellitus without complications: Secondary | ICD-10-CM | POA: Diagnosis not present

## 2014-08-28 DIAGNOSIS — E559 Vitamin D deficiency, unspecified: Secondary | ICD-10-CM | POA: Diagnosis not present

## 2014-08-28 DIAGNOSIS — I10 Essential (primary) hypertension: Secondary | ICD-10-CM | POA: Diagnosis not present

## 2014-10-24 DIAGNOSIS — H43393 Other vitreous opacities, bilateral: Secondary | ICD-10-CM | POA: Diagnosis not present

## 2014-10-24 DIAGNOSIS — H35362 Drusen (degenerative) of macula, left eye: Secondary | ICD-10-CM | POA: Diagnosis not present

## 2014-10-24 DIAGNOSIS — H4011X3 Primary open-angle glaucoma, severe stage: Secondary | ICD-10-CM | POA: Diagnosis not present

## 2014-10-24 DIAGNOSIS — H4011X1 Primary open-angle glaucoma, mild stage: Secondary | ICD-10-CM | POA: Diagnosis not present

## 2014-10-24 DIAGNOSIS — E119 Type 2 diabetes mellitus without complications: Secondary | ICD-10-CM | POA: Diagnosis not present

## 2014-11-24 ENCOUNTER — Encounter: Payer: Self-pay | Admitting: Podiatry

## 2014-11-24 ENCOUNTER — Ambulatory Visit (INDEPENDENT_AMBULATORY_CARE_PROVIDER_SITE_OTHER): Payer: Medicare Other | Admitting: Podiatry

## 2014-11-24 ENCOUNTER — Ambulatory Visit (INDEPENDENT_AMBULATORY_CARE_PROVIDER_SITE_OTHER): Payer: Medicare Other

## 2014-11-24 VITALS — BP 146/92 | HR 86 | Resp 12

## 2014-11-24 DIAGNOSIS — R52 Pain, unspecified: Secondary | ICD-10-CM

## 2014-11-24 DIAGNOSIS — E1140 Type 2 diabetes mellitus with diabetic neuropathy, unspecified: Secondary | ICD-10-CM | POA: Diagnosis not present

## 2014-11-24 DIAGNOSIS — E1149 Type 2 diabetes mellitus with other diabetic neurological complication: Secondary | ICD-10-CM

## 2014-11-24 NOTE — Patient Instructions (Signed)
Diabetic Neuropathy Diabetic neuropathy is a nerve disease or nerve damage that is caused by diabetes mellitus. About half of all people with diabetes mellitus have some form of nerve damage. Nerve damage is more common in those who have had diabetes mellitus for many years and who generally have not had good control of their blood sugar (glucose) level. Diabetic neuropathy is a common complication of diabetes mellitus. There are three more common types of diabetic neuropathy and a fourth type that is less common and less understood:   Peripheral neuropathy--This is the most common type of diabetic neuropathy. It causes damage to the nerves of the feet and legs first and then eventually the hands and arms.The damage affects the ability to sense touch.  Autonomic neuropathy--This type causes damage to the autonomic nervous system, which controls the following functions:  Heartbeat.  Body temperature.  Blood pressure.  Urination.  Digestion.  Sweating.  Sexual function.  Focal neuropathy--Focal neuropathy can be painful and unpredictable and occurs most often in older adults with diabetes mellitus. It involves a specific nerve or one area and often comes on suddenly. It usually does not cause long-term problems.  Radiculoplexus neuropathy-- Sometimes called lumbosacral radiculoplexus neuropathy, radiculoplexus neuropathy affects the nerves of the thighs, hips, buttocks, or legs. It is more common in people with type 2 diabetes mellitus and in older men. It is characterized by debilitating pain, weakness, and atrophy, usually in the thigh muscles. CAUSES  The cause of peripheral, autonomic, and focal neuropathies is diabetes mellitus that is uncontrolled and high glucose levels. The cause of radiculoplexus neuropathy is unknown. However, it is thought to be caused by inflammation related to uncontrolled glucose levels. SIGNS AND SYMPTOMS  Peripheral Neuropathy Peripheral neuropathy develops  slowly over time. When the nerves of the feet and legs no longer work there may be:   Burning, stabbing, or aching pain in the legs or feet.  Inability to feel pressure or pain in your feet. This can lead to:  Thick calluses over pressure areas.  Pressure sores.  Ulcers.  Foot deformities.  Reduced ability to feel temperature changes.  Muscle weakness. Autonomic Neuropathy The symptoms of autonomic neuropathy vary depending on which nerves are affected. Symptoms may include:  Problems with digestion, such as:  Feeling sick to your stomach (nausea).  Vomiting.  Bloating.  Constipation.  Diarrhea.  Abdominal pain.  Difficulty with urination. This occurs if you lose your ability to sense when your bladder is full. Problems include:  Urine leakage (incontinence).  Inability to empty your bladder completely (retention).  Rapid or irregular heartbeat (palpitations).  Blood pressure drops when you stand up (orthostatic hypotension). When you stand up you may feel:  Dizzy.  Weak.  Faint.  In men, inability to attain and maintain an erection.  In women, vaginal dryness and problems with decreased sexual desire and arousal.  Problems with body temperature regulation.  Increased or decreased sweating. Focal Neuropathy  Abnormal eye movements or abnormal alignment of both eyes.  Weakness in the wrist.  Foot drop. This results in an inability to lift the foot properly and abnormal walking or foot movement.  Paralysis on one side of your face (Bell palsy).  Chest or abdominal pain. Radiculoplexus Neuropathy  Sudden, severe pain in your hip, thigh, or buttocks.  Weakness and wasting of thigh muscles.  Difficulty rising from a seated position.  Abdominal swelling.  Unexplained weight loss (usually more than 10 lb [4.5 kg]). DIAGNOSIS  Peripheral Neuropathy Your senses may   be tested. Sensory function testing can be done with:  A light touch using a  monofilament.  A vibration with tuning fork.  A sharp sensation with a pin prick. Other tests that can help diagnose neuropathy are:  Nerve conduction velocity. This test checks the transmission of an electrical current through a nerve.  Electromyography. This shows how muscles respond to electrical signals transmitted by nearby nerves.  Quantitative sensory testing. This is used to assess how your nerves respond to vibrations and changes in temperature. Autonomic Neuropathy Diagnosis is often based on reported symptoms. Tell your health care provider if you experience:   Dizziness.   Constipation.   Diarrhea.   Inappropriate urination or inability to urinate.   Inability to get or maintain an erection.  Tests that may be done include:   Electrocardiography or Holter monitor. These are tests that can help show problems with the heart rate or heart rhythm.   An X-ray exam may be done. Focal Neuropathy Diagnosis is made based on your symptoms and what your health care provider finds during your exam. Other tests may be done. They may include:  Nerve conduction velocities. This checks the transmission of electrical current through a nerve.  Electromyography. This shows how muscles respond to electrical signals transmitted by nearby nerves.  Quantitative sensory testing. This test is used to assess how your nerves respond to vibration and changes in temperature. Radiculoplexus Neuropathy  Often the first thing is to eliminate any other issue or problems that might be the cause, as there is no stick test for diagnosis.  X-ray exam of your spine and lumbar region.  Spinal tap to rule out cancer.  MRI to rule out other lesions. TREATMENT  Once nerve damage occurs, it cannot be reversed. The goal of treatment is to keep the disease or nerve damage from getting worse and affecting more nerve fibers. Controlling your blood glucose level is the key. Most people with  radiculoplexus neuropathy see at least a partial improvement over time. You will need to keep your blood glucose and HbA1c levels in the target range determined by your health care provider. Things that help control blood glucose levels include:   Blood glucose monitoring.   Meal planning.   Physical activity.   Diabetes medicine.  Over time, maintaining lower blood glucose levels helps lessen symptoms. Sometimes, prescription pain medicine is needed. HOME CARE INSTRUCTIONS:  Do not smoke.  Keep your blood glucose level in the range that you and your health care provider have determined acceptable for you.  Keep your blood pressure level in the range that you and your health care provider have determined acceptable for you.  Eat a well-balanced diet.  Be active every day.  Check your feet every day. SEEK MEDICAL CARE IF:   You have burning, stabbing, or aching pain in the legs or feet.  You are unable to feel pressure or pain in your feet.  You develop problems with digestion such as:  Nausea.  Vomiting.  Bloating.  Constipation.  Diarrhea.  Abdominal pain.  You have difficulty with urination, such as:  Incontinence.  Retention.  You have palpitations.  You develop orthostatic hypotension. When you stand up you may feel:  Dizzy.  Weak.  Faint.  You cannot attain and maintain an erection (in men).  You have vaginal dryness and problems with decreased sexual desire and arousal (in women).  You have severe pain in your thighs, legs, or buttocks.  You have unexplained weight loss.   Document Released: 06/13/2001 Document Revised: 01/23/2013 Document Reviewed: 09/13/2012 Exit[employer] Patient Information 2015 [employer]. This information is not intended to replace advice given to you by your health care provider. Make sure you discuss any questions you have with your health care provider. 

## 2014-11-24 NOTE — Progress Notes (Signed)
   Subjective:    Patient ID: Vickie Young, female    DOB: 09/30/1923, 81 y.o.   MRN: [id number]  HPI  81 year old female presents the office they with her daughter for consent of bilateral foot pain, stiffness, burning, numbness which is been ongoing for greater than 5 years. They state that the symptoms remain about the same has not been progressive. She denies any swelling or redness to her feet. She denies any history of injury or trauma. She said no prior treatment for this. She is diabetic and states her last blood sugar is 117. She does not have any claudication symptoms that she currently walks 1 mile per day 3 times a week without any problems. No other complaints at this time.  Review of Systems  All other systems reviewed and are negative.      Objective:   Physical Exam AAO 3, NAD DP/PT pulses palpable, CRT less than 3 seconds Protective sensation absent with Derrel Nip monofilament, absent vibratory sensation. There is no specific area pinpoint bony tenderness or pain with vibratory sensation. Ankle, subtalar, midtarsal joint range of motion is intact without any restriction. There is no overlying edema, erythema, increase in warmth. There is no open lesions or pre-ulcerative lesions identified bilaterally. There is no pain with calf compression, swelling, warmth, erythema. MMT 5/5, ROM WNL     Assessment & Plan:  81 year old female with peripheral neuropathy likely causing the majority of her symptoms.  -X-rays were obtained and reviewed with the patient.  -Treatment options discussed including all alternatives, risks, and complications -Discussed likely etiology of her symptoms -Discussed possible starting gabapentin or other medications for neuropathy seems to help her symptoms. The patients daughter had multiple concerns starting any new medications. I discussed with her that I'll talk to the patient's primary care physician before starting any treatment.    -Follow-up 6 weeks or sooner if any problems arise. In the meantime, encouraged to call the office with any questions, concerns, change in symptoms.   [name]  Patient's daugther is Vickie Young [phone]

## 2014-11-25 ENCOUNTER — Encounter: Payer: Self-pay | Admitting: *Deleted

## 2014-11-25 ENCOUNTER — Telehealth: Payer: Self-pay | Admitting: *Deleted

## 2014-11-25 DIAGNOSIS — E119 Type 2 diabetes mellitus without complications: Secondary | ICD-10-CM | POA: Diagnosis not present

## 2014-11-25 DIAGNOSIS — E780 Pure hypercholesterolemia: Secondary | ICD-10-CM | POA: Diagnosis not present

## 2014-11-25 DIAGNOSIS — I10 Essential (primary) hypertension: Secondary | ICD-10-CM | POA: Diagnosis not present

## 2014-11-25 NOTE — Telephone Encounter (Addendum)
-----   Message from Trula Slade, DPM sent at 11/24/2014  6:23 PM EDT ----- I saw this patient Monday and she was having symptoms consistent with neuropathy. On exam she has absent sensation with simms weinstein and vibratory sensation. I discussed starting gabapentin 100mg  at night and wean up as needed. The daughter has multiple concerns of starting a new medication without talking to the primary doctor. Can we call the PCP or send a letter asking if it is ok to start gabapentin? Thanks. I asked pt her primary doctor's name, Dr. [name] and I informed her we would be asking for his opinion concerning adding Gabapentin 100mg  at bedtime to her medication regimen.  I sent a letter and pt's last chart notes to Dr. [name], at [facility], [address] [place], [place] [address], concerning Gabapentin.  Pt's dtr, called stating the pt's doctor had not received a note concerning pt's addition of Gabapentin.  I left a message on Ms. Glenn's phone informing the letter was sent 11/25/2014 to Dr. Katrine Coho office by mail.  Sammie Bench, Doctor of Pharmaceuticals saw pt today in office and request last chart notes, and x-ray report to be faxed to [phone].  Done.  X-ray reports faxed to Dr. Brantley Persons nurse - Izora Gala.

## 2014-11-27 DIAGNOSIS — E119 Type 2 diabetes mellitus without complications: Secondary | ICD-10-CM | POA: Diagnosis not present

## 2014-11-27 DIAGNOSIS — E780 Pure hypercholesterolemia: Secondary | ICD-10-CM | POA: Diagnosis not present

## 2014-11-27 DIAGNOSIS — I10 Essential (primary) hypertension: Secondary | ICD-10-CM | POA: Diagnosis not present

## 2014-11-27 NOTE — Telephone Encounter (Signed)
They are dictated. Thanks

## 2014-12-03 ENCOUNTER — Telehealth: Payer: Self-pay | Admitting: Podiatry

## 2014-12-03 NOTE — Telephone Encounter (Signed)
I do not have any paperwork and do not see any documentation for Diabetic shoes I will forward to [name] to see if she may have received anything.

## 2014-12-03 NOTE — Telephone Encounter (Signed)
Val, this would have been faxed paperwork from an outside vendor.  I have nothing.

## 2014-12-03 NOTE — Telephone Encounter (Signed)
PTS PCP CALLED ASKING IF WE GOT THE AUTHORIZATION FOR PT TO GET DIABETIC SHOES. PTS DAUGHTER CALLED THERE OFFICE. THE OFFICE STATED THEY FAXED IT ON [phone].IF YOU COULD CALL DAUGHTER AND LET HER KNOW WHAT NEEDS TO BE DONE. IF YOU NEED ANYTHING FROM DR [facility] OFFICE THE # IS [phone] AND HIS NURSE IS SALLY.

## 2014-12-03 NOTE — Telephone Encounter (Signed)
Melody, I don't receive diabetic shoe anything other than pt calls.  Vickie Young.

## 2014-12-04 NOTE — Telephone Encounter (Signed)
Melody, neither do I!  So, I guess the vendor will need to be contacted, by whoever knows who the vendor is.  Marcy Siren

## 2014-12-08 NOTE — Telephone Encounter (Signed)
Tried to return call to [place] states Vickie Young is not Dr Lambert Mody nurse but in pharmacology.  She was not available left message for her to call me back so that we can get Ms. Solt what she is needing.

## 2014-12-08 NOTE — Telephone Encounter (Signed)
12/05/14 Tried to call [facility] Dr Lambert Mody nurse sat on hold for 12 minutes before I had to hang up.

## 2014-12-10 NOTE — Telephone Encounter (Signed)
Tried again to contact [facility] receptionist states that she is with a patient could try to help.  I told her that I was returning a call from [place] on 12/03/14 that I had researched the patients chart and do not see any mention of diabetic shoe orders by our Dr. And have not received any orders from their office.

## 2015-01-30 DIAGNOSIS — E1143 Type 2 diabetes mellitus with diabetic autonomic (poly)neuropathy: Secondary | ICD-10-CM | POA: Diagnosis not present

## 2015-01-30 DIAGNOSIS — E119 Type 2 diabetes mellitus without complications: Secondary | ICD-10-CM | POA: Diagnosis not present

## 2015-02-11 ENCOUNTER — Ambulatory Visit: Payer: Medicare Other | Admitting: *Deleted

## 2015-02-11 DIAGNOSIS — E1149 Type 2 diabetes mellitus with other diabetic neurological complication: Secondary | ICD-10-CM

## 2015-02-11 NOTE — Progress Notes (Signed)
Patient ID: Vickie Young, female   DOB: February 02, 1924, 81 y.o.   MRN: [id number] Patient presents to be scanned and measured for diabetic shoes and inserts.

## 2015-02-24 DIAGNOSIS — E7800 Pure hypercholesterolemia, unspecified: Secondary | ICD-10-CM | POA: Diagnosis not present

## 2015-02-24 DIAGNOSIS — I10 Essential (primary) hypertension: Secondary | ICD-10-CM | POA: Diagnosis not present

## 2015-02-24 DIAGNOSIS — E119 Type 2 diabetes mellitus without complications: Secondary | ICD-10-CM | POA: Diagnosis not present

## 2015-02-26 DIAGNOSIS — I10 Essential (primary) hypertension: Secondary | ICD-10-CM | POA: Diagnosis not present

## 2015-02-26 DIAGNOSIS — Z23 Encounter for immunization: Secondary | ICD-10-CM | POA: Diagnosis not present

## 2015-02-26 DIAGNOSIS — E7800 Pure hypercholesterolemia, unspecified: Secondary | ICD-10-CM | POA: Diagnosis not present

## 2015-02-26 DIAGNOSIS — E119 Type 2 diabetes mellitus without complications: Secondary | ICD-10-CM | POA: Diagnosis not present

## 2015-03-20 ENCOUNTER — Ambulatory Visit (INDEPENDENT_AMBULATORY_CARE_PROVIDER_SITE_OTHER): Payer: Medicare Other | Admitting: Podiatry

## 2015-03-20 DIAGNOSIS — M2142 Flat foot [pes planus] (acquired), left foot: Secondary | ICD-10-CM

## 2015-03-20 DIAGNOSIS — M2141 Flat foot [pes planus] (acquired), right foot: Secondary | ICD-10-CM

## 2015-03-20 DIAGNOSIS — E1149 Type 2 diabetes mellitus with other diabetic neurological complication: Secondary | ICD-10-CM | POA: Diagnosis not present

## 2015-03-20 DIAGNOSIS — M2042 Other hammer toe(s) (acquired), left foot: Secondary | ICD-10-CM

## 2015-03-20 DIAGNOSIS — M2041 Other hammer toe(s) (acquired), right foot: Secondary | ICD-10-CM

## 2015-03-20 NOTE — Progress Notes (Signed)
Patient ID: Vickie Young, female   DOB: 12-Feb-1924, 81 y.o.   MRN: [id number] Patient presents for diabetic shoe pick up, shoes are tried on for good fit.  Patient received 1 Pair Apex [id number] Robyn black in women's 11 medium and 3 pairs custom molded diabetic inserts.  Verbal and written break in and wear instructions given.  Patient will follow up for scheduled routine care. She had no new complaints or concerns today.

## 2015-03-20 NOTE — Patient Instructions (Signed)

## 2015-03-24 DIAGNOSIS — E559 Vitamin D deficiency, unspecified: Secondary | ICD-10-CM | POA: Diagnosis not present

## 2015-03-24 DIAGNOSIS — N390 Urinary tract infection, site not specified: Secondary | ICD-10-CM | POA: Diagnosis not present

## 2015-03-24 DIAGNOSIS — Z7982 Long term (current) use of aspirin: Secondary | ICD-10-CM | POA: Diagnosis not present

## 2015-03-24 DIAGNOSIS — I10 Essential (primary) hypertension: Secondary | ICD-10-CM | POA: Diagnosis not present

## 2015-03-24 DIAGNOSIS — E119 Type 2 diabetes mellitus without complications: Secondary | ICD-10-CM | POA: Diagnosis not present

## 2015-03-27 DIAGNOSIS — E1121 Type 2 diabetes mellitus with diabetic nephropathy: Secondary | ICD-10-CM | POA: Diagnosis not present

## 2015-03-27 DIAGNOSIS — E7800 Pure hypercholesterolemia, unspecified: Secondary | ICD-10-CM | POA: Diagnosis not present

## 2015-03-27 DIAGNOSIS — Z1212 Encounter for screening for malignant neoplasm of rectum: Secondary | ICD-10-CM | POA: Diagnosis not present

## 2015-03-27 DIAGNOSIS — I10 Essential (primary) hypertension: Secondary | ICD-10-CM | POA: Diagnosis not present

## 2015-03-27 DIAGNOSIS — Z853 Personal history of malignant neoplasm of breast: Secondary | ICD-10-CM | POA: Diagnosis not present

## 2015-04-03 DIAGNOSIS — I10 Essential (primary) hypertension: Secondary | ICD-10-CM | POA: Diagnosis not present

## 2015-04-23 DIAGNOSIS — H401111 Primary open-angle glaucoma, right eye, mild stage: Secondary | ICD-10-CM | POA: Diagnosis not present

## 2015-04-23 DIAGNOSIS — H401123 Primary open-angle glaucoma, left eye, severe stage: Secondary | ICD-10-CM | POA: Diagnosis not present

## 2015-04-23 DIAGNOSIS — H02403 Unspecified ptosis of bilateral eyelids: Secondary | ICD-10-CM | POA: Diagnosis not present

## 2015-04-23 DIAGNOSIS — H1851 Endothelial corneal dystrophy: Secondary | ICD-10-CM | POA: Diagnosis not present

## 2015-05-01 DIAGNOSIS — I10 Essential (primary) hypertension: Secondary | ICD-10-CM | POA: Diagnosis not present

## 2015-05-04 ENCOUNTER — Ambulatory Visit (INDEPENDENT_AMBULATORY_CARE_PROVIDER_SITE_OTHER): Payer: Medicare Other | Admitting: Podiatry

## 2015-05-04 DIAGNOSIS — B351 Tinea unguium: Secondary | ICD-10-CM

## 2015-05-04 DIAGNOSIS — E1149 Type 2 diabetes mellitus with other diabetic neurological complication: Secondary | ICD-10-CM | POA: Diagnosis not present

## 2015-05-04 DIAGNOSIS — M79676 Pain in unspecified toe(s): Secondary | ICD-10-CM

## 2015-05-04 NOTE — Progress Notes (Signed)
Subjective: Patient presents with her daughter requesting new diabetic shoes and presents today for painful, elongated toenails to some of her toes which cause pressure in her shoes. She denies any swelling erythema or drainage from the toenails. Denies any recent open sores. Denies any systemic complaints such as fevers, chills, nausea, vomiting. No acute changes since last appointment, and no other complaints at this time.   Objective: AAO x3, NAD DP/PT pulses palpable bilaterally, CRT less than 3 seconds Protective sensation  Absent with Derrel Nip monofilament, absent vibratory sensation. His exam is unchanged. Nails appear to be somewhat dystrophic, discolored, hypertrophic , brittle. The nails affected are the second , third , fifth toes bilaterally. These nails are also painful subjectively mostly with pressure in shoe gear. Upon debridement there is no underlying ulceration, drainage or other signs of infection. No areas of pinpoint bony tenderness or pain with vibratory sensation. Hammertoes present. MMT 5/5, ROM WNL. No edema, erythema, increase in warmth to bilateral lower extremities.  No open lesions or pre-ulcerative lesions.  No pain with calf compression, swelling, warmth, erythema  Assessment: Symptomatic onychomycosis, neuropathy requesting diabetic shoes  Plan: -All treatment options discussed with the patient including all alternatives, risks, complications.  -Nails  Debrided 6 without complications or bleeding -Paperwork was completed today for diabetic shoe precertification -Daily foot inspection -Follow-up in 3 months or sooner if any problems arise. In the meantime, encouraged to call the office with any questions, concerns, change in symptoms.   [name], DPM

## 2015-05-05 DIAGNOSIS — I10 Essential (primary) hypertension: Secondary | ICD-10-CM | POA: Diagnosis not present

## 2015-06-04 DIAGNOSIS — R600 Localized edema: Secondary | ICD-10-CM | POA: Diagnosis not present

## 2015-06-18 DIAGNOSIS — H401123 Primary open-angle glaucoma, left eye, severe stage: Secondary | ICD-10-CM | POA: Diagnosis not present

## 2015-07-02 DIAGNOSIS — H401111 Primary open-angle glaucoma, right eye, mild stage: Secondary | ICD-10-CM | POA: Diagnosis not present

## 2015-07-24 DIAGNOSIS — E1121 Type 2 diabetes mellitus with diabetic nephropathy: Secondary | ICD-10-CM | POA: Diagnosis not present

## 2015-07-24 DIAGNOSIS — E119 Type 2 diabetes mellitus without complications: Secondary | ICD-10-CM | POA: Diagnosis not present

## 2015-07-24 DIAGNOSIS — E7800 Pure hypercholesterolemia, unspecified: Secondary | ICD-10-CM | POA: Diagnosis not present

## 2015-07-27 DIAGNOSIS — E7800 Pure hypercholesterolemia, unspecified: Secondary | ICD-10-CM | POA: Diagnosis not present

## 2015-07-27 DIAGNOSIS — E1121 Type 2 diabetes mellitus with diabetic nephropathy: Secondary | ICD-10-CM | POA: Diagnosis not present

## 2015-07-27 DIAGNOSIS — I10 Essential (primary) hypertension: Secondary | ICD-10-CM | POA: Diagnosis not present

## 2015-11-02 ENCOUNTER — Encounter: Payer: Self-pay | Admitting: Podiatry

## 2015-11-02 ENCOUNTER — Encounter: Payer: Medicare Other | Admitting: Podiatry

## 2015-11-12 DIAGNOSIS — E119 Type 2 diabetes mellitus without complications: Secondary | ICD-10-CM | POA: Diagnosis not present

## 2015-11-12 DIAGNOSIS — H401123 Primary open-angle glaucoma, left eye, severe stage: Secondary | ICD-10-CM | POA: Diagnosis not present

## 2015-11-12 DIAGNOSIS — H43393 Other vitreous opacities, bilateral: Secondary | ICD-10-CM | POA: Diagnosis not present

## 2015-11-12 DIAGNOSIS — H401111 Primary open-angle glaucoma, right eye, mild stage: Secondary | ICD-10-CM | POA: Diagnosis not present

## 2015-11-27 DIAGNOSIS — E1121 Type 2 diabetes mellitus with diabetic nephropathy: Secondary | ICD-10-CM | POA: Diagnosis not present

## 2015-11-27 DIAGNOSIS — E7800 Pure hypercholesterolemia, unspecified: Secondary | ICD-10-CM | POA: Diagnosis not present

## 2015-11-27 DIAGNOSIS — I10 Essential (primary) hypertension: Secondary | ICD-10-CM | POA: Diagnosis not present

## 2015-11-30 NOTE — Progress Notes (Signed)
Error

## 2015-12-02 DIAGNOSIS — E1121 Type 2 diabetes mellitus with diabetic nephropathy: Secondary | ICD-10-CM | POA: Diagnosis not present

## 2015-12-02 DIAGNOSIS — I10 Essential (primary) hypertension: Secondary | ICD-10-CM | POA: Diagnosis not present

## 2015-12-02 DIAGNOSIS — E7800 Pure hypercholesterolemia, unspecified: Secondary | ICD-10-CM | POA: Diagnosis not present

## 2015-12-18 ENCOUNTER — Other Ambulatory Visit: Payer: Self-pay

## 2016-02-18 DIAGNOSIS — M7989 Other specified soft tissue disorders: Secondary | ICD-10-CM | POA: Diagnosis not present

## 2016-02-18 DIAGNOSIS — M2560 Stiffness of unspecified joint, not elsewhere classified: Secondary | ICD-10-CM | POA: Diagnosis not present

## 2016-02-18 DIAGNOSIS — Z23 Encounter for immunization: Secondary | ICD-10-CM | POA: Diagnosis not present

## 2016-03-24 DIAGNOSIS — I10 Essential (primary) hypertension: Secondary | ICD-10-CM | POA: Diagnosis not present

## 2016-03-24 DIAGNOSIS — Z0000 Encounter for general adult medical examination without abnormal findings: Secondary | ICD-10-CM | POA: Diagnosis not present

## 2016-03-24 DIAGNOSIS — N390 Urinary tract infection, site not specified: Secondary | ICD-10-CM | POA: Diagnosis not present

## 2016-03-24 DIAGNOSIS — E559 Vitamin D deficiency, unspecified: Secondary | ICD-10-CM | POA: Diagnosis not present

## 2016-03-24 DIAGNOSIS — E1121 Type 2 diabetes mellitus with diabetic nephropathy: Secondary | ICD-10-CM | POA: Diagnosis not present

## 2016-03-24 DIAGNOSIS — E7800 Pure hypercholesterolemia, unspecified: Secondary | ICD-10-CM | POA: Diagnosis not present

## 2016-03-29 DIAGNOSIS — Z8639 Personal history of other endocrine, nutritional and metabolic disease: Secondary | ICD-10-CM | POA: Diagnosis not present

## 2016-03-29 DIAGNOSIS — H409 Unspecified glaucoma: Secondary | ICD-10-CM | POA: Diagnosis not present

## 2016-03-29 DIAGNOSIS — Z23 Encounter for immunization: Secondary | ICD-10-CM | POA: Diagnosis not present

## 2016-03-29 DIAGNOSIS — Z853 Personal history of malignant neoplasm of breast: Secondary | ICD-10-CM | POA: Diagnosis not present

## 2016-03-29 DIAGNOSIS — R269 Unspecified abnormalities of gait and mobility: Secondary | ICD-10-CM | POA: Diagnosis not present

## 2016-05-30 DIAGNOSIS — I10 Essential (primary) hypertension: Secondary | ICD-10-CM | POA: Diagnosis not present

## 2016-05-30 DIAGNOSIS — E1143 Type 2 diabetes mellitus with diabetic autonomic (poly)neuropathy: Secondary | ICD-10-CM | POA: Diagnosis not present

## 2016-05-30 DIAGNOSIS — E1121 Type 2 diabetes mellitus with diabetic nephropathy: Secondary | ICD-10-CM | POA: Diagnosis not present

## 2016-05-30 DIAGNOSIS — E7800 Pure hypercholesterolemia, unspecified: Secondary | ICD-10-CM | POA: Diagnosis not present

## 2016-06-02 DIAGNOSIS — E1121 Type 2 diabetes mellitus with diabetic nephropathy: Secondary | ICD-10-CM | POA: Diagnosis not present

## 2016-06-02 DIAGNOSIS — I10 Essential (primary) hypertension: Secondary | ICD-10-CM | POA: Diagnosis not present

## 2016-06-02 DIAGNOSIS — E7800 Pure hypercholesterolemia, unspecified: Secondary | ICD-10-CM | POA: Diagnosis not present

## 2016-07-14 DIAGNOSIS — H02403 Unspecified ptosis of bilateral eyelids: Secondary | ICD-10-CM | POA: Diagnosis not present

## 2016-07-14 DIAGNOSIS — H401123 Primary open-angle glaucoma, left eye, severe stage: Secondary | ICD-10-CM | POA: Diagnosis not present

## 2016-07-14 DIAGNOSIS — H1851 Endothelial corneal dystrophy: Secondary | ICD-10-CM | POA: Diagnosis not present

## 2016-07-14 DIAGNOSIS — H401111 Primary open-angle glaucoma, right eye, mild stage: Secondary | ICD-10-CM | POA: Diagnosis not present

## 2016-12-07 DIAGNOSIS — H43393 Other vitreous opacities, bilateral: Secondary | ICD-10-CM | POA: Diagnosis not present

## 2016-12-07 DIAGNOSIS — E113291 Type 2 diabetes mellitus with mild nonproliferative diabetic retinopathy without macular edema, right eye: Secondary | ICD-10-CM | POA: Diagnosis not present

## 2016-12-07 DIAGNOSIS — H401111 Primary open-angle glaucoma, right eye, mild stage: Secondary | ICD-10-CM | POA: Diagnosis not present

## 2016-12-07 DIAGNOSIS — H401123 Primary open-angle glaucoma, left eye, severe stage: Secondary | ICD-10-CM | POA: Diagnosis not present

## 2016-12-15 DIAGNOSIS — E7800 Pure hypercholesterolemia, unspecified: Secondary | ICD-10-CM | POA: Diagnosis not present

## 2016-12-15 DIAGNOSIS — I10 Essential (primary) hypertension: Secondary | ICD-10-CM | POA: Diagnosis not present

## 2016-12-15 DIAGNOSIS — E1121 Type 2 diabetes mellitus with diabetic nephropathy: Secondary | ICD-10-CM | POA: Diagnosis not present

## 2017-04-03 DIAGNOSIS — E1121 Type 2 diabetes mellitus with diabetic nephropathy: Secondary | ICD-10-CM | POA: Diagnosis not present

## 2017-04-03 DIAGNOSIS — E7800 Pure hypercholesterolemia, unspecified: Secondary | ICD-10-CM | POA: Diagnosis not present

## 2017-04-03 DIAGNOSIS — E559 Vitamin D deficiency, unspecified: Secondary | ICD-10-CM | POA: Diagnosis not present

## 2017-04-03 DIAGNOSIS — Z0000 Encounter for general adult medical examination without abnormal findings: Secondary | ICD-10-CM | POA: Diagnosis not present

## 2017-04-03 DIAGNOSIS — I10 Essential (primary) hypertension: Secondary | ICD-10-CM | POA: Diagnosis not present

## 2017-04-06 DIAGNOSIS — Z7982 Long term (current) use of aspirin: Secondary | ICD-10-CM | POA: Diagnosis not present

## 2017-04-06 DIAGNOSIS — D649 Anemia, unspecified: Secondary | ICD-10-CM | POA: Diagnosis not present

## 2017-04-06 DIAGNOSIS — R269 Unspecified abnormalities of gait and mobility: Secondary | ICD-10-CM | POA: Diagnosis not present

## 2017-04-06 DIAGNOSIS — H9190 Unspecified hearing loss, unspecified ear: Secondary | ICD-10-CM | POA: Diagnosis not present

## 2017-04-06 DIAGNOSIS — I10 Essential (primary) hypertension: Secondary | ICD-10-CM | POA: Diagnosis not present

## 2017-04-06 DIAGNOSIS — E11319 Type 2 diabetes mellitus with unspecified diabetic retinopathy without macular edema: Secondary | ICD-10-CM | POA: Diagnosis not present

## 2017-04-06 DIAGNOSIS — Z6837 Body mass index (BMI) 37.0-37.9, adult: Secondary | ICD-10-CM | POA: Diagnosis not present

## 2017-04-06 DIAGNOSIS — G5712 Meralgia paresthetica, left lower limb: Secondary | ICD-10-CM | POA: Diagnosis not present

## 2017-04-06 DIAGNOSIS — E1121 Type 2 diabetes mellitus with diabetic nephropathy: Secondary | ICD-10-CM | POA: Diagnosis not present

## 2017-04-06 DIAGNOSIS — H409 Unspecified glaucoma: Secondary | ICD-10-CM | POA: Diagnosis not present

## 2017-04-06 DIAGNOSIS — R600 Localized edema: Secondary | ICD-10-CM | POA: Diagnosis not present

## 2017-04-24 DIAGNOSIS — R262 Difficulty in walking, not elsewhere classified: Secondary | ICD-10-CM | POA: Diagnosis not present

## 2017-04-24 DIAGNOSIS — M6281 Muscle weakness (generalized): Secondary | ICD-10-CM | POA: Diagnosis not present

## 2017-04-28 DIAGNOSIS — E7800 Pure hypercholesterolemia, unspecified: Secondary | ICD-10-CM | POA: Diagnosis not present

## 2017-04-28 DIAGNOSIS — E1121 Type 2 diabetes mellitus with diabetic nephropathy: Secondary | ICD-10-CM | POA: Diagnosis not present

## 2017-05-05 DIAGNOSIS — R262 Difficulty in walking, not elsewhere classified: Secondary | ICD-10-CM | POA: Diagnosis not present

## 2017-05-05 DIAGNOSIS — M6281 Muscle weakness (generalized): Secondary | ICD-10-CM | POA: Diagnosis not present

## 2017-05-12 DIAGNOSIS — M6281 Muscle weakness (generalized): Secondary | ICD-10-CM | POA: Diagnosis not present

## 2017-05-12 DIAGNOSIS — R262 Difficulty in walking, not elsewhere classified: Secondary | ICD-10-CM | POA: Diagnosis not present

## 2017-05-15 DIAGNOSIS — R262 Difficulty in walking, not elsewhere classified: Secondary | ICD-10-CM | POA: Diagnosis not present

## 2017-05-15 DIAGNOSIS — M6281 Muscle weakness (generalized): Secondary | ICD-10-CM | POA: Diagnosis not present

## 2017-06-21 DIAGNOSIS — H401111 Primary open-angle glaucoma, right eye, mild stage: Secondary | ICD-10-CM | POA: Diagnosis not present

## 2017-06-21 DIAGNOSIS — H401123 Primary open-angle glaucoma, left eye, severe stage: Secondary | ICD-10-CM | POA: Diagnosis not present

## 2017-06-21 DIAGNOSIS — H1851 Endothelial corneal dystrophy: Secondary | ICD-10-CM | POA: Diagnosis not present

## 2017-06-21 DIAGNOSIS — H21562 Pupillary abnormality, left eye: Secondary | ICD-10-CM | POA: Diagnosis not present

## 2017-06-22 DIAGNOSIS — E1121 Type 2 diabetes mellitus with diabetic nephropathy: Secondary | ICD-10-CM | POA: Diagnosis not present

## 2017-06-22 DIAGNOSIS — E7800 Pure hypercholesterolemia, unspecified: Secondary | ICD-10-CM | POA: Diagnosis not present

## 2017-06-22 DIAGNOSIS — I10 Essential (primary) hypertension: Secondary | ICD-10-CM | POA: Diagnosis not present

## 2017-12-22 DIAGNOSIS — E1121 Type 2 diabetes mellitus with diabetic nephropathy: Secondary | ICD-10-CM | POA: Diagnosis not present

## 2017-12-22 DIAGNOSIS — E7800 Pure hypercholesterolemia, unspecified: Secondary | ICD-10-CM | POA: Diagnosis not present

## 2017-12-22 DIAGNOSIS — I10 Essential (primary) hypertension: Secondary | ICD-10-CM | POA: Diagnosis not present

## 2018-03-05 ENCOUNTER — Other Ambulatory Visit: Payer: Self-pay

## 2018-04-13 DIAGNOSIS — G5712 Meralgia paresthetica, left lower limb: Secondary | ICD-10-CM | POA: Diagnosis not present

## 2018-04-13 DIAGNOSIS — R269 Unspecified abnormalities of gait and mobility: Secondary | ICD-10-CM | POA: Diagnosis not present

## 2018-04-13 DIAGNOSIS — E1121 Type 2 diabetes mellitus with diabetic nephropathy: Secondary | ICD-10-CM | POA: Diagnosis not present

## 2018-04-16 DIAGNOSIS — E1121 Type 2 diabetes mellitus with diabetic nephropathy: Secondary | ICD-10-CM | POA: Diagnosis not present

## 2018-04-16 DIAGNOSIS — H906 Mixed conductive and sensorineural hearing loss, bilateral: Secondary | ICD-10-CM | POA: Diagnosis not present

## 2018-04-16 DIAGNOSIS — Z0000 Encounter for general adult medical examination without abnormal findings: Secondary | ICD-10-CM | POA: Diagnosis not present

## 2018-04-16 DIAGNOSIS — E11319 Type 2 diabetes mellitus with unspecified diabetic retinopathy without macular edema: Secondary | ICD-10-CM | POA: Diagnosis not present

## 2018-04-16 DIAGNOSIS — Z23 Encounter for immunization: Secondary | ICD-10-CM | POA: Diagnosis not present

## 2018-04-20 DIAGNOSIS — E7800 Pure hypercholesterolemia, unspecified: Secondary | ICD-10-CM | POA: Diagnosis not present

## 2018-04-20 DIAGNOSIS — E1121 Type 2 diabetes mellitus with diabetic nephropathy: Secondary | ICD-10-CM | POA: Diagnosis not present

## 2018-04-20 DIAGNOSIS — D649 Anemia, unspecified: Secondary | ICD-10-CM | POA: Diagnosis not present

## 2018-04-20 DIAGNOSIS — Z23 Encounter for immunization: Secondary | ICD-10-CM | POA: Diagnosis not present

## 2018-04-20 DIAGNOSIS — I10 Essential (primary) hypertension: Secondary | ICD-10-CM | POA: Diagnosis not present

## 2018-04-24 DIAGNOSIS — E113291 Type 2 diabetes mellitus with mild nonproliferative diabetic retinopathy without macular edema, right eye: Secondary | ICD-10-CM | POA: Diagnosis not present

## 2018-04-24 DIAGNOSIS — H401123 Primary open-angle glaucoma, left eye, severe stage: Secondary | ICD-10-CM | POA: Diagnosis not present

## 2018-04-24 DIAGNOSIS — H04123 Dry eye syndrome of bilateral lacrimal glands: Secondary | ICD-10-CM | POA: Diagnosis not present

## 2018-04-24 DIAGNOSIS — H401111 Primary open-angle glaucoma, right eye, mild stage: Secondary | ICD-10-CM | POA: Diagnosis not present

## 2018-09-20 DIAGNOSIS — D649 Anemia, unspecified: Secondary | ICD-10-CM | POA: Diagnosis not present

## 2018-09-20 DIAGNOSIS — E1121 Type 2 diabetes mellitus with diabetic nephropathy: Secondary | ICD-10-CM | POA: Diagnosis not present

## 2018-09-20 DIAGNOSIS — I10 Essential (primary) hypertension: Secondary | ICD-10-CM | POA: Diagnosis not present

## 2018-09-20 DIAGNOSIS — E7800 Pure hypercholesterolemia, unspecified: Secondary | ICD-10-CM | POA: Diagnosis not present

## 2018-12-28 ENCOUNTER — Observation Stay (HOSPITAL_COMMUNITY): Payer: Medicare Other | Admitting: Anesthesiology

## 2018-12-28 ENCOUNTER — Emergency Department (HOSPITAL_COMMUNITY): Payer: Medicare Other

## 2018-12-28 ENCOUNTER — Observation Stay (HOSPITAL_COMMUNITY): Payer: Medicare Other

## 2018-12-28 ENCOUNTER — Other Ambulatory Visit: Payer: Self-pay

## 2018-12-28 ENCOUNTER — Inpatient Hospital Stay (HOSPITAL_COMMUNITY)
Admission: EM | Admit: 2018-12-28 | Discharge: 2019-01-01 | DRG: 493 | Disposition: A | Discharge status: Skilled Nursing Facility | Payer: Medicare Other | Attending: Internal Medicine | Admitting: Internal Medicine

## 2018-12-28 ENCOUNTER — Encounter (HOSPITAL_COMMUNITY)
Admission: EM | Disposition: A | Discharge status: Skilled Nursing Facility | Payer: Self-pay | Source: Home / Self Care | Attending: Internal Medicine

## 2018-12-28 ENCOUNTER — Encounter (HOSPITAL_COMMUNITY): Payer: Self-pay | Admitting: Emergency Medicine

## 2018-12-28 DIAGNOSIS — S52502A Unspecified fracture of the lower end of left radius, initial encounter for closed fracture: Secondary | ICD-10-CM | POA: Diagnosis not present

## 2018-12-28 DIAGNOSIS — Z03818 Encounter for observation for suspected exposure to other biological agents ruled out: Secondary | ICD-10-CM | POA: Diagnosis not present

## 2018-12-28 DIAGNOSIS — S42292D Other displaced fracture of upper end of left humerus, subsequent encounter for fracture with routine healing: Secondary | ICD-10-CM | POA: Diagnosis not present

## 2018-12-28 DIAGNOSIS — E119 Type 2 diabetes mellitus without complications: Secondary | ICD-10-CM | POA: Diagnosis present

## 2018-12-28 DIAGNOSIS — S52612A Displaced fracture of left ulna styloid process, initial encounter for closed fracture: Secondary | ICD-10-CM | POA: Diagnosis not present

## 2018-12-28 DIAGNOSIS — S52572A Other intraarticular fracture of lower end of left radius, initial encounter for closed fracture: Secondary | ICD-10-CM | POA: Diagnosis present

## 2018-12-28 DIAGNOSIS — S42322A Displaced transverse fracture of shaft of humerus, left arm, initial encounter for closed fracture: Secondary | ICD-10-CM | POA: Diagnosis not present

## 2018-12-28 DIAGNOSIS — Z23 Encounter for immunization: Secondary | ICD-10-CM | POA: Diagnosis not present

## 2018-12-28 DIAGNOSIS — G8918 Other acute postprocedural pain: Secondary | ICD-10-CM

## 2018-12-28 DIAGNOSIS — Z9010 Acquired absence of unspecified breast and nipple: Secondary | ICD-10-CM

## 2018-12-28 DIAGNOSIS — Z20828 Contact with and (suspected) exposure to other viral communicable diseases: Secondary | ICD-10-CM | POA: Diagnosis not present

## 2018-12-28 DIAGNOSIS — H409 Unspecified glaucoma: Secondary | ICD-10-CM | POA: Diagnosis present

## 2018-12-28 DIAGNOSIS — W19XXXA Unspecified fall, initial encounter: Secondary | ICD-10-CM | POA: Diagnosis not present

## 2018-12-28 DIAGNOSIS — D62 Acute posthemorrhagic anemia: Secondary | ICD-10-CM | POA: Diagnosis not present

## 2018-12-28 DIAGNOSIS — E876 Hypokalemia: Secondary | ICD-10-CM | POA: Diagnosis present

## 2018-12-28 DIAGNOSIS — Y92017 Garden or yard in single-family (private) house as the place of occurrence of the external cause: Secondary | ICD-10-CM

## 2018-12-28 DIAGNOSIS — S199XXA Unspecified injury of neck, initial encounter: Secondary | ICD-10-CM | POA: Diagnosis not present

## 2018-12-28 DIAGNOSIS — R0902 Hypoxemia: Secondary | ICD-10-CM | POA: Diagnosis not present

## 2018-12-28 DIAGNOSIS — I10 Essential (primary) hypertension: Secondary | ICD-10-CM | POA: Diagnosis not present

## 2018-12-28 DIAGNOSIS — S299XXA Unspecified injury of thorax, initial encounter: Secondary | ICD-10-CM | POA: Diagnosis not present

## 2018-12-28 DIAGNOSIS — S42292A Other displaced fracture of upper end of left humerus, initial encounter for closed fracture: Secondary | ICD-10-CM

## 2018-12-28 DIAGNOSIS — R52 Pain, unspecified: Secondary | ICD-10-CM

## 2018-12-28 DIAGNOSIS — E785 Hyperlipidemia, unspecified: Secondary | ICD-10-CM | POA: Diagnosis not present

## 2018-12-28 DIAGNOSIS — Y9301 Activity, walking, marching and hiking: Secondary | ICD-10-CM | POA: Diagnosis present

## 2018-12-28 DIAGNOSIS — S0990XA Unspecified injury of head, initial encounter: Secondary | ICD-10-CM | POA: Diagnosis not present

## 2018-12-28 DIAGNOSIS — Z853 Personal history of malignant neoplasm of breast: Secondary | ICD-10-CM

## 2018-12-28 DIAGNOSIS — S42201A Unspecified fracture of upper end of right humerus, initial encounter for closed fracture: Secondary | ICD-10-CM | POA: Diagnosis not present

## 2018-12-28 DIAGNOSIS — Z419 Encounter for procedure for purposes other than remedying health state, unspecified: Secondary | ICD-10-CM

## 2018-12-28 DIAGNOSIS — Z7982 Long term (current) use of aspirin: Secondary | ICD-10-CM

## 2018-12-28 DIAGNOSIS — S42352A Displaced comminuted fracture of shaft of humerus, left arm, initial encounter for closed fracture: Secondary | ICD-10-CM | POA: Diagnosis not present

## 2018-12-28 DIAGNOSIS — S42202A Unspecified fracture of upper end of left humerus, initial encounter for closed fracture: Secondary | ICD-10-CM | POA: Diagnosis not present

## 2018-12-28 DIAGNOSIS — Z79899 Other long term (current) drug therapy: Secondary | ICD-10-CM

## 2018-12-28 DIAGNOSIS — S42309A Unspecified fracture of shaft of humerus, unspecified arm, initial encounter for closed fracture: Secondary | ICD-10-CM | POA: Diagnosis present

## 2018-12-28 DIAGNOSIS — S42202D Unspecified fracture of upper end of left humerus, subsequent encounter for fracture with routine healing: Secondary | ICD-10-CM | POA: Diagnosis not present

## 2018-12-28 DIAGNOSIS — F05 Delirium due to known physiological condition: Secondary | ICD-10-CM | POA: Diagnosis not present

## 2018-12-28 DIAGNOSIS — Z7984 Long term (current) use of oral hypoglycemic drugs: Secondary | ICD-10-CM

## 2018-12-28 DIAGNOSIS — W101XXA Fall (on)(from) sidewalk curb, initial encounter: Secondary | ICD-10-CM | POA: Diagnosis present

## 2018-12-28 HISTORY — DX: Essential (primary) hypertension: I10

## 2018-12-28 HISTORY — DX: Unspecified fracture of the lower end of left radius, initial encounter for closed fracture: S52.502A

## 2018-12-28 HISTORY — DX: Malignant neoplasm of unspecified site of unspecified female breast: C50.919

## 2018-12-28 HISTORY — DX: Unspecified fracture of upper end of left humerus, initial encounter for closed fracture: S42.202A

## 2018-12-28 HISTORY — DX: Unspecified glaucoma: H40.9

## 2018-12-28 HISTORY — PX: ORIF HUMERUS FRACTURE: SHX2126

## 2018-12-28 HISTORY — PX: ORIF WRIST FRACTURE: SHX2133

## 2018-12-28 LAB — CBC WITH DIFFERENTIAL/PLATELET
Abs Immature Granulocytes: 0.11 10*3/uL — ABNORMAL HIGH (ref 0.00–0.07)
Basophils Absolute: 0 10*3/uL (ref 0.0–0.1)
Basophils Relative: 0 %
Eosinophils Absolute: 0.1 10*3/uL (ref 0.0–0.5)
Eosinophils Relative: 1 %
HCT: 35.6 % — ABNORMAL LOW (ref 36.0–46.0)
Hemoglobin: 11.2 g/dL — ABNORMAL LOW (ref 12.0–15.0)
Immature Granulocytes: 1 %
Lymphocytes Relative: 8 %
Lymphs Abs: 1.2 10*3/uL (ref 0.7–4.0)
MCH: 28.3 pg (ref 26.0–34.0)
MCHC: 31.5 g/dL (ref 30.0–36.0)
MCV: 89.9 fL (ref 80.0–100.0)
Monocytes Absolute: 0.7 10*3/uL (ref 0.1–1.0)
Monocytes Relative: 4 %
Neutro Abs: 13.4 10*3/uL — ABNORMAL HIGH (ref 1.7–7.7)
Neutrophils Relative %: 86 %
Platelets: 284 10*3/uL (ref 150–400)
RBC: 3.96 MIL/uL (ref 3.87–5.11)
RDW: 12.3 % (ref 11.5–15.5)
WBC: 15.5 10*3/uL — ABNORMAL HIGH (ref 4.0–10.5)
nRBC: 0 % (ref 0.0–0.2)

## 2018-12-28 LAB — BASIC METABOLIC PANEL
Anion gap: 9 (ref 5–15)
BUN: 18 mg/dL (ref 8–23)
CO2: 24 mmol/L (ref 22–32)
Calcium: 9.5 mg/dL (ref 8.9–10.3)
Chloride: 106 mmol/L (ref 98–111)
Creatinine, Ser: 1.15 mg/dL — ABNORMAL HIGH (ref 0.44–1.00)
GFR calc Af Amer: 47 mL/min — ABNORMAL LOW (ref >60)
GFR calc non Af Amer: 41 mL/min — ABNORMAL LOW (ref >60)
Glucose, Bld: 229 mg/dL — ABNORMAL HIGH (ref 70–99)
Potassium: 3.8 mmol/L (ref 3.5–5.1)
Sodium: 139 mmol/L (ref 135–145)

## 2018-12-28 LAB — URINALYSIS, ROUTINE W REFLEX MICROSCOPIC
Bacteria, UA: NONE SEEN
Bilirubin Urine: NEGATIVE
Glucose, UA: >=500 mg/dL — AB
Hgb urine dipstick: NEGATIVE
Ketones, ur: NEGATIVE mg/dL
Nitrite: NEGATIVE
Protein, ur: NEGATIVE mg/dL
Specific Gravity, Urine: 1.009 (ref 1.005–1.030)
pH: 7 (ref 5.0–8.0)

## 2018-12-28 LAB — TYPE AND SCREEN
ABO/RH(D): B POS
Antibody Screen: NEGATIVE

## 2018-12-28 LAB — GLUCOSE, CAPILLARY
Glucose-Capillary: 180 mg/dL — ABNORMAL HIGH (ref 70–99)
Glucose-Capillary: 182 mg/dL — ABNORMAL HIGH (ref 70–99)

## 2018-12-28 LAB — PROTIME-INR
INR: 1 (ref 0.8–1.2)
Prothrombin Time: 13.2 s (ref 11.4–15.2)

## 2018-12-28 LAB — ABO/RH: ABO/RH(D): B POS

## 2018-12-28 LAB — SARS CORONAVIRUS 2 BY RT PCR (HOSPITAL ORDER, PERFORMED IN ~~LOC~~ HOSPITAL LAB): SARS Coronavirus 2: NEGATIVE

## 2018-12-28 LAB — CK: Total CK: 213 U/L (ref 38–234)

## 2018-12-28 SURGERY — OPEN REDUCTION INTERNAL FIXATION (ORIF) PROXIMAL HUMERUS FRACTURE
Anesthesia: Monitor Anesthesia Care | Site: Wrist | Laterality: Left

## 2018-12-28 MED ORDER — PHENYLEPHRINE 40 MCG/ML (10ML) SYRINGE FOR IV PUSH (FOR BLOOD PRESSURE SUPPORT)
PREFILLED_SYRINGE | INTRAVENOUS | Status: DC | PRN
  Administered 2018-12-28: 120 ug via INTRAVENOUS

## 2018-12-28 MED ORDER — TETANUS-DIPHTH-ACELL PERTUSSIS 5-2.5-18.5 LF-MCG/0.5 IM SUSP
0.5000 mL | Freq: Once | INTRAMUSCULAR | Status: AC
  Administered 2018-12-28: 0.5 mL via INTRAMUSCULAR
  Filled 2018-12-28: qty 0.5

## 2018-12-28 MED ORDER — PROPOFOL 10 MG/ML IV BOLUS
INTRAVENOUS | Status: DC | PRN
  Administered 2018-12-28: 50 mg via INTRAVENOUS

## 2018-12-28 MED ORDER — POLYETHYLENE GLYCOL 3350 17 G PO PACK: 17.0000 g | PACK | Freq: Every day | ORAL | Status: DC | PRN

## 2018-12-28 MED ORDER — POTASSIUM CHLORIDE IN NACL 20-0.45 MEQ/L-% IV SOLN
INTRAVENOUS | Status: DC
  Administered 2018-12-28: 21:00:00 via INTRAVENOUS
  Filled 2018-12-28: qty 1000

## 2018-12-28 MED ORDER — SODIUM CHLORIDE 0.9 % IV BOLUS
500.0000 mL | Freq: Once | INTRAVENOUS | Status: AC
  Administered 2018-12-28: 500 mL via INTRAVENOUS

## 2018-12-28 MED ORDER — HYDROCODONE-ACETAMINOPHEN 5-325 MG PO TABS: 1.0000 | ORAL_TABLET | ORAL | Status: DC | PRN

## 2018-12-28 MED ORDER — HYDRALAZINE HCL 20 MG/ML IJ SOLN
5.0000 mg | Freq: Once | INTRAMUSCULAR | Status: AC
  Administered 2018-12-28: 19:00:00 5 mg via INTRAVENOUS

## 2018-12-28 MED ORDER — BRIMONIDINE TARTRATE-TIMOLOL 0.2-0.5 % OP SOLN: 1.0000 [drp] | Freq: Two times a day (BID) | OPHTHALMIC | Status: DC

## 2018-12-28 MED ORDER — BISACODYL 5 MG PO TBEC: 5.0000 mg | DELAYED_RELEASE_TABLET | Freq: Every day | ORAL | Status: DC | PRN

## 2018-12-28 MED ORDER — HYDRALAZINE HCL 20 MG/ML IJ SOLN
INTRAMUSCULAR | Status: AC
  Filled 2018-12-28: qty 1

## 2018-12-28 MED ORDER — GLYCOPYRROLATE 0.2 MG/ML IJ SOLN
INTRAMUSCULAR | Status: DC | PRN
  Administered 2018-12-28 (×2): 0.1 mg via INTRAVENOUS

## 2018-12-28 MED ORDER — FENTANYL CITRATE (PF) 100 MCG/2ML IJ SOLN: 25.0000 ug | INTRAMUSCULAR | Status: DC | PRN

## 2018-12-28 MED ORDER — ROCURONIUM BROMIDE 10 MG/ML (PF) SYRINGE
PREFILLED_SYRINGE | INTRAVENOUS | Status: AC
  Filled 2018-12-28: qty 10

## 2018-12-28 MED ORDER — BISACODYL 10 MG RE SUPP: 10.0000 mg | Freq: Every day | RECTAL | Status: DC | PRN

## 2018-12-28 MED ORDER — ASPIRIN EC 81 MG PO TBEC
81.0000 mg | DELAYED_RELEASE_TABLET | Freq: Every day | ORAL | Status: DC
  Administered 2018-12-29 – 2019-01-01 (×4): 81 mg via ORAL
  Filled 2018-12-28 (×4): qty 1

## 2018-12-28 MED ORDER — ZOLPIDEM TARTRATE 5 MG PO TABS: 5.0000 mg | ORAL_TABLET | Freq: Every evening | ORAL | Status: DC | PRN

## 2018-12-28 MED ORDER — MORPHINE SULFATE (PF) 2 MG/ML IV SOLN: 0.5000 mg | INTRAVENOUS | Status: DC | PRN

## 2018-12-28 MED ORDER — LIDOCAINE 2% (20 MG/ML) 5 ML SYRINGE
INTRAMUSCULAR | Status: AC
  Filled 2018-12-28: qty 5

## 2018-12-28 MED ORDER — HYDROCODONE-ACETAMINOPHEN 5-325 MG PO TABS: 1.0000 | ORAL_TABLET | Freq: Four times a day (QID) | ORAL | Status: DC | PRN

## 2018-12-28 MED ORDER — ACETAMINOPHEN 10 MG/ML IV SOLN: 1000.0000 mg | Freq: Once | INTRAVENOUS | Status: DC | PRN

## 2018-12-28 MED ORDER — LABETALOL HCL 5 MG/ML IV SOLN
5.0000 mg | INTRAVENOUS | Status: DC | PRN
  Administered 2018-12-28 (×3): 5 mg via INTRAVENOUS

## 2018-12-28 MED ORDER — BUPIVACAINE-EPINEPHRINE (PF) 0.25% -1:200000 IJ SOLN
INTRAMUSCULAR | Status: AC
  Filled 2018-12-28: qty 30

## 2018-12-28 MED ORDER — DEXAMETHASONE SODIUM PHOSPHATE 10 MG/ML IJ SOLN
INTRAMUSCULAR | Status: DC | PRN
  Administered 2018-12-28: 10 mg via INTRAVENOUS

## 2018-12-28 MED ORDER — BRIMONIDINE TARTRATE 0.2 % OP SOLN
1.0000 [drp] | Freq: Two times a day (BID) | OPHTHALMIC | Status: DC
  Administered 2018-12-28 – 2019-01-01 (×8): 1 [drp] via OPHTHALMIC
  Filled 2018-12-28: qty 5

## 2018-12-28 MED ORDER — ACETAMINOPHEN 500 MG PO TABS
500.0000 mg | ORAL_TABLET | Freq: Four times a day (QID) | ORAL | Status: AC
  Administered 2018-12-28 – 2018-12-29 (×4): 500 mg via ORAL
  Filled 2018-12-28 (×4): qty 1

## 2018-12-28 MED ORDER — ONDANSETRON HCL 4 MG/2ML IJ SOLN
INTRAMUSCULAR | Status: DC | PRN
  Administered 2018-12-28: 4 mg via INTRAVENOUS

## 2018-12-28 MED ORDER — METHOCARBAMOL 1000 MG/10ML IJ SOLN
500.0000 mg | Freq: Four times a day (QID) | INTRAVENOUS | Status: DC | PRN
  Filled 2018-12-28: qty 5

## 2018-12-28 MED ORDER — ROCURONIUM BROMIDE 100 MG/10ML IV SOLN: INTRAVENOUS | Status: DC | PRN

## 2018-12-28 MED ORDER — FENTANYL CITRATE (PF) 250 MCG/5ML IJ SOLN
INTRAMUSCULAR | Status: AC
  Filled 2018-12-28: qty 5

## 2018-12-28 MED ORDER — POVIDONE-IODINE 10 % EX SWAB: 2.0000 "application " | Freq: Once | CUTANEOUS | Status: DC

## 2018-12-28 MED ORDER — LABETALOL HCL 5 MG/ML IV SOLN
INTRAVENOUS | Status: AC
  Filled 2018-12-28: qty 4

## 2018-12-28 MED ORDER — TRAMADOL HCL 50 MG PO TABS: 50.0000 mg | ORAL_TABLET | Freq: Four times a day (QID) | ORAL | Status: DC | PRN

## 2018-12-28 MED ORDER — LACTATED RINGERS IV SOLN
INTRAVENOUS | Status: DC
  Administered 2018-12-28 (×2): via INTRAVENOUS

## 2018-12-28 MED ORDER — METOCLOPRAMIDE HCL 5 MG/ML IJ SOLN: 5.0000 mg | Freq: Three times a day (TID) | INTRAMUSCULAR | Status: DC | PRN

## 2018-12-28 MED ORDER — AMLODIPINE BESYLATE 5 MG PO TABS
5.0000 mg | ORAL_TABLET | Freq: Every day | ORAL | Status: DC
  Administered 2018-12-29 – 2019-01-01 (×4): 5 mg via ORAL
  Filled 2018-12-28 (×4): qty 1

## 2018-12-28 MED ORDER — PHENOL 1.4 % MT LIQD: 1.0000 | OROMUCOSAL | Status: DC | PRN

## 2018-12-28 MED ORDER — METOCLOPRAMIDE HCL 5 MG PO TABS: 5.0000 mg | ORAL_TABLET | Freq: Three times a day (TID) | ORAL | Status: DC | PRN

## 2018-12-28 MED ORDER — CEFAZOLIN SODIUM-DEXTROSE 2-4 GM/100ML-% IV SOLN
2.0000 g | INTRAVENOUS | Status: AC
  Administered 2018-12-28: 2 g via INTRAVENOUS

## 2018-12-28 MED ORDER — ATORVASTATIN CALCIUM 10 MG PO TABS: 10.0000 mg | ORAL_TABLET | Freq: Every day | ORAL | Status: DC

## 2018-12-28 MED ORDER — LABETALOL HCL 5 MG/ML IV SOLN
INTRAVENOUS | Status: DC | PRN
  Administered 2018-12-28: 2.5 mg via INTRAVENOUS

## 2018-12-28 MED ORDER — ROCURONIUM BROMIDE 10 MG/ML (PF) SYRINGE
PREFILLED_SYRINGE | INTRAVENOUS | Status: DC | PRN
  Administered 2018-12-28 (×2): 20 mg via INTRAVENOUS
  Administered 2018-12-28: 50 mg via INTRAVENOUS

## 2018-12-28 MED ORDER — DIPHENHYDRAMINE HCL 12.5 MG/5ML PO ELIX: 12.5000 mg | ORAL_SOLUTION | ORAL | Status: DC | PRN

## 2018-12-28 MED ORDER — DOCUSATE SODIUM 100 MG PO CAPS: 100.0000 mg | ORAL_CAPSULE | Freq: Two times a day (BID) | ORAL | Status: DC

## 2018-12-28 MED ORDER — ONDANSETRON HCL 4 MG/2ML IJ SOLN: 4.0000 mg | Freq: Once | INTRAMUSCULAR | Status: DC | PRN

## 2018-12-28 MED ORDER — ACETAMINOPHEN 325 MG PO TABS
325.0000 mg | ORAL_TABLET | Freq: Four times a day (QID) | ORAL | Status: DC | PRN
  Administered 2018-12-30: 650 mg via ORAL
  Filled 2018-12-28: qty 2

## 2018-12-28 MED ORDER — CHLORHEXIDINE GLUCONATE 4 % EX LIQD: 60.0000 mL | Freq: Once | CUTANEOUS | Status: DC

## 2018-12-28 MED ORDER — METHOCARBAMOL 500 MG PO TABS: 500.0000 mg | ORAL_TABLET | Freq: Four times a day (QID) | ORAL | Status: DC | PRN

## 2018-12-28 MED ORDER — PHENYLEPHRINE 40 MCG/ML (10ML) SYRINGE FOR IV PUSH (FOR BLOOD PRESSURE SUPPORT)
PREFILLED_SYRINGE | INTRAVENOUS | Status: AC
  Filled 2018-12-28: qty 10

## 2018-12-28 MED ORDER — TIMOLOL MALEATE 0.5 % OP SOLN
1.0000 [drp] | Freq: Two times a day (BID) | OPHTHALMIC | Status: DC
  Administered 2018-12-28 – 2019-01-01 (×8): 1 [drp] via OPHTHALMIC
  Filled 2018-12-28: qty 5

## 2018-12-28 MED ORDER — INFLUENZA VAC A&B SA ADJ QUAD 0.5 ML IM PRSY
0.5000 mL | PREFILLED_SYRINGE | INTRAMUSCULAR | Status: AC
  Administered 2018-12-30: 11:00:00 0.5 mL via INTRAMUSCULAR
  Filled 2018-12-28: qty 0.5

## 2018-12-28 MED ORDER — CEFAZOLIN SODIUM-DEXTROSE 1-4 GM/50ML-% IV SOLN
1.0000 g | Freq: Four times a day (QID) | INTRAVENOUS | Status: AC
  Administered 2018-12-28 – 2018-12-29 (×3): 1 g via INTRAVENOUS
  Filled 2018-12-28 (×3): qty 50

## 2018-12-28 MED ORDER — ROPIVACAINE HCL 5 MG/ML IJ SOLN
INTRAMUSCULAR | Status: DC | PRN
  Administered 2018-12-28: 20 mL via PERINEURAL

## 2018-12-28 MED ORDER — DOCUSATE SODIUM 100 MG PO CAPS
100.0000 mg | ORAL_CAPSULE | Freq: Two times a day (BID) | ORAL | Status: DC
  Administered 2018-12-28 – 2019-01-01 (×8): 100 mg via ORAL
  Filled 2018-12-28 (×8): qty 1

## 2018-12-28 MED ORDER — BUPIVACAINE-EPINEPHRINE 0.25% -1:200000 IJ SOLN
INTRAMUSCULAR | Status: DC | PRN
  Administered 2018-12-28: 30 mL

## 2018-12-28 MED ORDER — SODIUM CHLORIDE 0.9 % IV SOLN
INTRAVENOUS | Status: DC | PRN
  Administered 2018-12-28: 14:00:00 35 ug/min via INTRAVENOUS

## 2018-12-28 MED ORDER — LIDOCAINE HCL (PF) 2 % IJ SOLN: INTRAMUSCULAR | Status: DC | PRN

## 2018-12-28 MED ORDER — ONDANSETRON HCL 4 MG PO TABS: 4.0000 mg | ORAL_TABLET | Freq: Four times a day (QID) | ORAL | Status: DC | PRN

## 2018-12-28 MED ORDER — 0.9 % SODIUM CHLORIDE (POUR BTL) OPTIME
TOPICAL | Status: DC | PRN
  Administered 2018-12-28: 1000 mL

## 2018-12-28 MED ORDER — MAGNESIUM CITRATE PO SOLN: 1.0000 | Freq: Once | ORAL | Status: DC | PRN

## 2018-12-28 MED ORDER — LIDOCAINE 2% (20 MG/ML) 5 ML SYRINGE
INTRAMUSCULAR | Status: DC | PRN
  Administered 2018-12-28: 60 mg via INTRAVENOUS

## 2018-12-28 MED ORDER — ONDANSETRON HCL 4 MG/2ML IJ SOLN
INTRAMUSCULAR | Status: AC
  Filled 2018-12-28: qty 2

## 2018-12-28 MED ORDER — MIDAZOLAM HCL 2 MG/2ML IJ SOLN
INTRAMUSCULAR | Status: AC
  Filled 2018-12-28: qty 2

## 2018-12-28 MED ORDER — FENTANYL CITRATE (PF) 100 MCG/2ML IJ SOLN
INTRAMUSCULAR | Status: AC
  Administered 2018-12-28: 13:00:00 25 ug via INTRAVENOUS
  Filled 2018-12-28: qty 2

## 2018-12-28 MED ORDER — MENTHOL 3 MG MT LOZG: 1.0000 | LOZENGE | OROMUCOSAL | Status: DC | PRN

## 2018-12-28 MED ORDER — CEFAZOLIN SODIUM-DEXTROSE 2-4 GM/100ML-% IV SOLN
INTRAVENOUS | Status: AC
  Filled 2018-12-28: qty 100

## 2018-12-28 MED ORDER — HYDROCODONE-ACETAMINOPHEN 7.5-325 MG PO TABS: 1.0000 | ORAL_TABLET | ORAL | Status: DC | PRN

## 2018-12-28 MED ORDER — GLYCOPYRROLATE PF 0.2 MG/ML IJ SOSY
PREFILLED_SYRINGE | INTRAMUSCULAR | Status: AC
  Filled 2018-12-28: qty 1

## 2018-12-28 MED ORDER — PHENYLEPHRINE HCL (PRESSORS) 10 MG/ML IV SOLN: INTRAVENOUS | Status: DC | PRN

## 2018-12-28 MED ORDER — LATANOPROST 0.005 % OP SOLN
1.0000 [drp] | Freq: Every day | OPHTHALMIC | Status: DC
  Administered 2018-12-28 – 2018-12-31 (×4): 1 [drp] via OPHTHALMIC
  Filled 2018-12-28: qty 2.5

## 2018-12-28 MED ORDER — ALUM & MAG HYDROXIDE-SIMETH 200-200-20 MG/5ML PO SUSP: 30.0000 mL | ORAL | Status: DC | PRN

## 2018-12-28 MED ORDER — FENTANYL CITRATE (PF) 100 MCG/2ML IJ SOLN
25.0000 ug | Freq: Once | INTRAMUSCULAR | Status: AC
  Administered 2018-12-28: 13:00:00 25 ug via INTRAVENOUS

## 2018-12-28 MED ORDER — FENTANYL CITRATE (PF) 100 MCG/2ML IJ SOLN
50.0000 ug | INTRAMUSCULAR | Status: DC | PRN
  Administered 2018-12-28: 25 ug via INTRAVENOUS
  Administered 2018-12-28 (×3): 50 ug via INTRAVENOUS
  Administered 2018-12-28: 25 ug via INTRAVENOUS
  Filled 2018-12-28 (×2): qty 2

## 2018-12-28 MED ORDER — SUGAMMADEX SODIUM 200 MG/2ML IV SOLN
INTRAVENOUS | Status: DC | PRN
  Administered 2018-12-28: 130 mg via INTRAVENOUS

## 2018-12-28 MED ORDER — ONDANSETRON HCL 4 MG/2ML IJ SOLN: 4.0000 mg | Freq: Four times a day (QID) | INTRAMUSCULAR | Status: DC | PRN

## 2018-12-28 MED ORDER — LOSARTAN POTASSIUM 50 MG PO TABS: 100.0000 mg | ORAL_TABLET | Freq: Every day | ORAL | Status: DC

## 2018-12-28 MED ORDER — PROPOFOL 10 MG/ML IV BOLUS
INTRAVENOUS | Status: AC
  Filled 2018-12-28: qty 20

## 2018-12-28 MED ORDER — INSULIN ASPART 100 UNIT/ML ~~LOC~~ SOLN
0.0000 [IU] | Freq: Three times a day (TID) | SUBCUTANEOUS | Status: DC
  Administered 2018-12-29: 1 [IU] via SUBCUTANEOUS
  Administered 2018-12-29: 12:00:00 2 [IU] via SUBCUTANEOUS
  Administered 2018-12-30: 08:00:00 1 [IU] via SUBCUTANEOUS
  Administered 2018-12-30 (×2): 2 [IU] via SUBCUTANEOUS
  Administered 2018-12-31: 1 [IU] via SUBCUTANEOUS
  Administered 2018-12-31: 13:00:00 3 [IU] via SUBCUTANEOUS
  Administered 2018-12-31 – 2019-01-01 (×2): 2 [IU] via SUBCUTANEOUS
  Administered 2019-01-01: 1 [IU] via SUBCUTANEOUS

## 2018-12-28 SURGICAL SUPPLY — 107 items
APL SKNCLS STERI-STRIP NONHPOA (GAUZE/BANDAGES/DRESSINGS)
BENZOIN TINCTURE PRP APPL 2/3 (GAUZE/BANDAGES/DRESSINGS) ×2 IMPLANT
BIT DRILL 2.2 SS TIBIAL (BIT) ×4 IMPLANT
BIT DRILL 3.2 (BIT) ×4
BIT DRILL 3.2XCALB NS DISP (BIT) IMPLANT
BIT DRILL CALIBRATED 2.7 (BIT) ×1 IMPLANT
BIT DRILL CALIBRATED 2.7MM (BIT) ×1
BIT DRL 3.2XCALB NS DISP (BIT) ×2
BLADE CLIPPER SURG (BLADE) IMPLANT
BNDG CMPR 9X4 STRL LF SNTH (GAUZE/BANDAGES/DRESSINGS) ×2
BNDG COHESIVE 4X5 TAN STRL (GAUZE/BANDAGES/DRESSINGS) ×2 IMPLANT
BNDG ELASTIC 3X5.8 VLCR STR LF (GAUZE/BANDAGES/DRESSINGS) ×2 IMPLANT
BNDG ELASTIC 4X5.8 VLCR STR LF (GAUZE/BANDAGES/DRESSINGS) ×4 IMPLANT
BNDG ESMARK 4X9 LF (GAUZE/BANDAGES/DRESSINGS) ×4 IMPLANT
CLEANER TIP ELECTROSURG 2X2 (MISCELLANEOUS) ×2 IMPLANT
CLOSURE STERI-STRIP 1/2X4 (GAUZE/BANDAGES/DRESSINGS) ×2
CLOSURE STERI-STRIP 1/4X4 (GAUZE/BANDAGES/DRESSINGS) ×2 IMPLANT
CLSR STERI-STRIP ANTIMIC 1/2X4 (GAUZE/BANDAGES/DRESSINGS) ×4 IMPLANT
CORD BIPOLAR FORCEPS 12FT (ELECTRODE) ×4 IMPLANT
COVER SURGICAL LIGHT HANDLE (MISCELLANEOUS) ×8 IMPLANT
COVER WAND RF STERILE (DRAPES) ×2 IMPLANT
CUFF TOURN SGL QUICK 18X4 (TOURNIQUET CUFF) ×4 IMPLANT
CUFF TOURN SGL QUICK 24 (TOURNIQUET CUFF)
CUFF TRNQT CYL 24X4X16.5-23 (TOURNIQUET CUFF) IMPLANT
DECANTER SPIKE VIAL GLASS SM (MISCELLANEOUS) IMPLANT
DRAPE C-ARM 42X72 X-RAY (DRAPES) ×4 IMPLANT
DRAPE IMP U-DRAPE 54X76 (DRAPES) ×6 IMPLANT
DRAPE INCISE IOBAN 66X45 STRL (DRAPES) ×4 IMPLANT
DRAPE OEC MINIVIEW 54X84 (DRAPES) IMPLANT
DRAPE U-SHAPE 47X51 STRL (DRAPES) ×6 IMPLANT
DRSG AQUACEL AG ADV 3.5X10 (GAUZE/BANDAGES/DRESSINGS) ×2 IMPLANT
DRSG MEPILEX BORDER 4X12 (GAUZE/BANDAGES/DRESSINGS) ×2 IMPLANT
DURAPREP 26ML APPLICATOR (WOUND CARE) ×6 IMPLANT
ELECT REM PT RETURN 9FT ADLT (ELECTROSURGICAL)
ELECTRODE REM PT RTRN 9FT ADLT (ELECTROSURGICAL) IMPLANT
GAUZE SPONGE 4X4 12PLY STRL (GAUZE/BANDAGES/DRESSINGS) ×4 IMPLANT
GAUZE XEROFORM 1X8 LF (GAUZE/BANDAGES/DRESSINGS) ×4 IMPLANT
GLOVE BIOGEL PI ORTHO PRO 7.5 (GLOVE) ×2
GLOVE BIOGEL PI ORTHO PRO SZ8 (GLOVE) ×8
GLOVE ORTHO TXT STRL SZ7.5 (GLOVE) ×12 IMPLANT
GLOVE PI ORTHO PRO STRL 7.5 (GLOVE) ×2 IMPLANT
GLOVE PI ORTHO PRO STRL SZ8 (GLOVE) ×2 IMPLANT
GLOVE SURG ORTHO 8.0 STRL STRW (GLOVE) ×12 IMPLANT
GOWN STRL REUS W/ TWL LRG LVL3 (GOWN DISPOSABLE) IMPLANT
GOWN STRL REUS W/TWL LRG LVL3 (GOWN DISPOSABLE)
K-WIRE 1.6 (WIRE) ×12
K-WIRE 2X5 SS THRDED S3 (WIRE) ×8
K-WIRE FX5X1.6XNS BN SS (WIRE) ×6
KIT BASIN OR (CUSTOM PROCEDURE TRAY) ×4 IMPLANT
KIT TURNOVER KIT B (KITS) ×4 IMPLANT
KWIRE 2X5 SS THRDED S3 (WIRE) IMPLANT
KWIRE FX5X1.6XNS BN SS (WIRE) IMPLANT
LOOP VESSEL MAXI BLUE (MISCELLANEOUS) ×2 IMPLANT
MANIFOLD NEPTUNE II (INSTRUMENTS) ×4 IMPLANT
NDL HYPO 25GX1X1/2 BEV (NEEDLE) IMPLANT
NEEDLE 22X1 1/2 (OR ONLY) (NEEDLE) IMPLANT
NEEDLE HYPO 25GX1X1/2 BEV (NEEDLE) IMPLANT
NS IRRIG 1000ML POUR BTL (IV SOLUTION) ×8 IMPLANT
PACK ORTHO EXTREMITY (CUSTOM PROCEDURE TRAY) ×2 IMPLANT
PACK SHOULDER (CUSTOM PROCEDURE TRAY) ×4 IMPLANT
PACK UNIVERSAL I (CUSTOM PROCEDURE TRAY) ×4 IMPLANT
PAD ARMBOARD 7.5X6 YLW CONV (MISCELLANEOUS) ×8 IMPLANT
PAD CAST 4YDX4 CTTN HI CHSV (CAST SUPPLIES) ×2 IMPLANT
PADDING CAST COTTON 4X4 STRL (CAST SUPPLIES) ×8
PADDING CAST SYNTHETIC 4 (CAST SUPPLIES) ×2
PADDING CAST SYNTHETIC 4X4 STR (CAST SUPPLIES) IMPLANT
PEG LOCKING 3.2X 28MM (Peg) ×4 IMPLANT
PEG LOCKING 3.2X36 (Screw) ×4 IMPLANT
PEG LOCKING 3.2X42 (Screw) ×2 IMPLANT
PEG LOCKING SMOOTH 2.2X16 (Screw) ×2 IMPLANT
PEG LOCKING SMOOTH 2.2X18 (Peg) ×2 IMPLANT
PEG LOCKING SMOOTH 2.2X20 (Screw) ×14 IMPLANT
PLATE PROX HUMERUS 4H LEFT LOW (Plate) ×2 IMPLANT
PLATE WIDE DVR LEFT (Plate) ×2 IMPLANT
SCREW  LP NL 2.7X15MM (Screw) ×2 IMPLANT
SCREW LOCK 16X2.7X 3 LD TPR (Screw) IMPLANT
SCREW LOCK 18X2.7X 3 LD TPR (Screw) IMPLANT
SCREW LOCK CORT STAR 3.5X28 (Screw) ×4 IMPLANT
SCREW LOCK CORT STAR 3.5X30 (Screw) ×2 IMPLANT
SCREW LOCKING 2.7X16 (Screw) ×4 IMPLANT
SCREW LOCKING 2.7X18 (Screw) ×4 IMPLANT
SCREW LOW PROF TIS 3.5X28MM (Screw) ×2 IMPLANT
SCREW LP NL 2.7X15MM (Screw) IMPLANT
SCREW LP NL T15 3.5X26 (Screw) ×2 IMPLANT
SCREW PEG LOCK 3.2X30MM (Screw) ×2 IMPLANT
SLING ARM FOAM STRAP MED (SOFTGOODS) ×2 IMPLANT
SPLINT PLASTER EXTRA FAST 3X15 (CAST SUPPLIES) ×2
SPLINT PLASTER GYPS XFAST 3X15 (CAST SUPPLIES) IMPLANT
SPONGE LAP 4X18 RFD (DISPOSABLE) IMPLANT
STAPLER VISISTAT 35W (STAPLE) IMPLANT
SUCTION FRAZIER HANDLE 10FR (MISCELLANEOUS) ×6
SUCTION TUBE FRAZIER 10FR DISP (MISCELLANEOUS) ×2 IMPLANT
SUPPORT WRAP ARM LG (MISCELLANEOUS) ×2 IMPLANT
SUT FIBERWIRE #2 38 T-5 BLUE (SUTURE) ×8
SUT VIC AB 0 CT1 27 (SUTURE) ×8
SUT VIC AB 0 CT1 27XBRD ANBCTR (SUTURE) IMPLANT
SUT VIC AB 2-0 CT1 27 (SUTURE) ×8
SUT VIC AB 2-0 CT1 TAPERPNT 27 (SUTURE) ×2 IMPLANT
SUT VIC AB 3-0 FS2 27 (SUTURE) ×4 IMPLANT
SUTURE FIBERWR #2 38 T-5 BLUE (SUTURE) IMPLANT
SYR BULB IRRIGATION 50ML (SYRINGE) ×4 IMPLANT
SYR CONTROL 10ML LL (SYRINGE) ×2 IMPLANT
TOWEL GREEN STERILE (TOWEL DISPOSABLE) ×4 IMPLANT
TOWEL GREEN STERILE FF (TOWEL DISPOSABLE) ×4 IMPLANT
TUBE CONNECTING 12'X1/4 (SUCTIONS) ×2
TUBE CONNECTING 12X1/4 (SUCTIONS) ×4 IMPLANT
WATER STERILE IRR 1000ML POUR (IV SOLUTION) ×4 IMPLANT

## 2018-12-28 NOTE — Anesthesia Preprocedure Evaluation (Signed)
Anesthesia Evaluation  Patient identified by MRN, date of birth, ID band Patient awake    Reviewed: Allergy & Precautions, NPO status , Patient's Chart, lab work & pertinent test results  Airway Mallampati: II  TM Distance: >3 FB Neck ROM: Full    Dental no notable dental hx.    Pulmonary neg pulmonary ROS,    Pulmonary exam normal breath sounds clear to auscultation       Cardiovascular hypertension, Pt. on medications Normal cardiovascular exam Rhythm:Regular Rate:Normal     Neuro/Psych negative neurological ROS  negative psych ROS   GI/Hepatic negative GI ROS, Neg liver ROS,   Endo/Other  negative endocrine ROSdiabetes, Type 2, Oral Hypoglycemic Agents, Insulin Dependent  Renal/GU negative Renal ROS  negative genitourinary   Musculoskeletal negative musculoskeletal ROS (+)   Abdominal   Peds negative pediatric ROS (+)  Hematology negative hematology ROS (+)   Anesthesia Other Findings L arm fractures s/p fall from walking  Reproductive/Obstetrics negative OB ROS                             Anesthesia Physical Anesthesia Plan  ASA: III  Anesthesia Plan: Regional and MAC   Post-op Pain Management:  Regional for Post-op pain and GA combined w/ Regional for post-op pain   Induction:   PONV Risk Score and Plan: 2 and TIVA, Propofol infusion and Treatment may vary due to age or medical condition  Airway Management Planned: Natural Airway and Nasal Cannula  Additional Equipment: None  Intra-op Plan:   Post-operative Plan:   Informed Consent: I have reviewed the patients History and Physical, chart, labs and discussed the procedure including the risks, benefits and alternatives for the proposed anesthesia with the patient or authorized representative who has indicated his/her understanding and acceptance.       Plan Discussed with: CRNA  Anesthesia Plan Comments:          Anesthesia Quick Evaluation

## 2018-12-28 NOTE — Op Note (Addendum)
12/28/2018  5:32 PM  PATIENT:  Vickie Young    PRE-OPERATIVE DIAGNOSIS:   1.  Left proximal humerus fracture, closed 2.  Left distal radius fracture, intra-articular, with comminution, and severe displacement  POST-OPERATIVE DIAGNOSIS:  Same  PROCEDURE:    1.  Open reduction internal fixation, left proximal humerus fracture 2.  Open reduction internal fixation, left distal radius fracture, greater than 3 pieces 3.  3 views of the left wrist taken postoperatively demonstrate near anatomic alignment, status post open reduction internal fixation.           SURGEON:  Johnny Bridge, MD  PHYSICIAN ASSISTANT: Joya Gaskins, OPA-C, present and scrubbed throughout the case, critical for completion in a timely fashion, and for retraction, instrumentation, and closure.  ANESTHESIA:   General with regional block  ESTIMATED BLOOD LOSS: 150 mL for the shoulder, and 50 mL for the wrist.  PREOPERATIVE INDICATIONS:  Vickie Young is a  85 y.o. female who had a mechanical fall and a significantly comminuted left distal radius fracture as well as proximal humerus fracture.  She elected for surgical management.  The risks benefits and alternatives were discussed with the patient and her daughter including but not limited to the risks of nonoperative treatment, versus surgical intervention including infection, bleeding, nerve injury, malunion, nonunion, the need for revision surgery, hardware prominence, hardware failure, the need for hardware removal, blood clots, cardiopulmonary complications, conversion to arthroplasty, morbidity, mortality, among others, and they were willing to proceed.  Predicted outcome is good, although there will be at least a six to nine month expected recovery.   OPERATIVE IMPLANTS: Biomet ALPS proximal humerus locking plate, with 3 distal locking screws, one nonlocking cortical screw that did not have very good purchase, with 6 proximal smooth locking  pegs  I used a Biomet DVR volar locking plate for the distal radius fracture with smooth distal locking pegs, and proximal cortical screws.  OPERATIVE FINDINGS: Displaced proximal humerus fracture, this was basically a proximal shaft fracture.  The distal radius had extreme comminution with loss of height, and intra-articular extension.  UNIQUE ASPECTS OF THE CASE: The initial cortical screw on the plate of the humerus did not have very good purchase, but I was able to get excellent locking screws with a total of 3 on the distal segment, I am not sure if I was engaged in a little bit of comminution medially with the nonlocking screw, or if the bone quality was just that poor.  I did not have to go very far to get fixation into the head, I used peg lengths of approximately 38 on anterior and posterior holes, with a 44 central.  I stopped when I felt good quality bone resistance with the drill into the head, and still had cortex intact laterally, such that I did not feel that I needed to get subchondral and risk of penetration or subsidence over the pegs.  On the distal radius, there was a large ulnar portion of the column that was separated, and fairly challenging to get reduced.  Ultimately I was able to get back out to length, and clamp the ulnar column together while I applied the plate distally, I used the broad standard length plate, in order to get fixation on both the radial and ulnar columns of the distal radius.  There was a significant amount of comminution distally as well, maintaining position was challenging.  OPERATIVE PROCEDURE: The patient was brought to the operating room and placed in the  supine position. General anesthesia was administered. IV antibiotics were given. She was placed in the beach chair position. All bony prominences were padded. The upper extremity was prepped and draped in usual sterile fashion. Deltopectoral incision was performed.  I exposed the fracture site, and  placed deep retractors. I did not tenotomize the biceps tendon. This was left in place. I elevated a small portion of the deltoid off of the shaft, in order to gain access for the plate. I placed supraspinatus and subscapularis stitches, and then reduced the head onto the shaft. This was maintained in satisfactory position.  I applied the plate and secured it into the sliding hole first.  The purchase was not great, so I was careful with maneuvering, I confirmed position of the reduction and the plate with C-arm, and I placed a total of 2 guidewires into the appropriate position in the head. I was satisfied that the plate was distal appropriately, and then I secured the plate distally with 3 additional locking screws and then secured the plate proximally with smooth pegs, taking care to prevent penetration into the arch articular surface, using C-arm, as well as manual feel using a hand drill.  I then passed the FiberWire sutures from the subscapularis and supraspinatus through the plate and secured the tuberosities. Once complete fixation and reduction of been achieved, took final C-arm pictures, and irrigated the wounds copiously, and repaired the deltopectoral interval with Vicryl followed by Vicryl for the subcutaneous tissue with Monocryl and Steri-Strips for the skin.   Then under a separate sterile prep and drape, the patient was placed in the supine position. General anesthesia was administered. IV antibiotics were given. Time out was performed. The upper extremity was prepped and draped in usual sterile fashion. The arm was elevated and exsanguinated and the tourniquet was inflated at 247mm hg.    Volar approach to the distal radius was carried out, and the flexor carpi radialis was retracted radially. The radial artery was protected throughout the case.   Deep dissection was carried down, and the pronator quadratus was elevated off of the radius.  It was already fairly destroyed.  There was an  abrasion over the distal ulna, but it did not communicate to any fracture lines.    The fracture site was identified and cleaned and reduced as anatomically as possible.  I mobilized the ulnar column, reduced it, and placed a crab claw clamp which I kept throughout the case.  This keyed into place adequately, but was extremely unstable.   I reduced the radial aspect of the distal radius, held this provisionally with a K wire, and C-arm used to confirm alignment.   I had restored height and inclination and then applied a volar plate. A K wire was used to confirm appropriate position of the plate, and once I was satisfied with the overall alignment I was able to secure the plate initially distally with smooth pegs, and then I brought the remainder of the plate and distal component down to the shaft securing it proximally with a cortical screw.   I confirmed that none of the pegs were in the joint, and none of these were penetrating the dorsal cortex. On the ulnar side of the plate distally I used shorter pegs than on the radial side.    The wounds were irrigated copiously, and I used the 3-0 subcutaneous Vicryl for the skin and Steri-Strips and sterile gauze and a volar splint. The tourniquet was released. She was awakened and returned  back in stable and satisfactory condition. There were no complications and She tolerated the procedure well.

## 2018-12-28 NOTE — ED Triage Notes (Signed)
Per ems family found pt outside on ground c/o falling. Pt has obvious deformity to left wrist.and c/o pain to left shoulder. Pt does not know when she fell. 95mc fentanyl moved pain from 10/10 to 2/10.

## 2018-12-28 NOTE — ED Provider Notes (Signed)
[facility] DEPART[facility] Provider Note   CSN: [id number] Arrival date & time:        History   Chief Complaint Chief Complaint  Patient presents with  . Arm Injury    HPI Vickie Young is a 85 y.o. female.     The history is provided by the patient.  Arm Injury Location:  Shoulder and wrist Shoulder location:  L shoulder Wrist location:  L wrist Injury: yes   Time since incident: Unknown. Pain details:    Quality:  Aching   Radiates to:  Does not radiate   Severity:  Moderate   Onset quality:  Sudden   Timing:  Constant   Progression:  Worsening Relieved by:  Nothing Worsened by:  Movement Associated symptoms: no fever   Patient with history of diabetes presents after fall.  Patient fell sometime in the middle the night landing on her left side.  She reports pain left shoulder left wrist.  Apparently she was outside for several hours.  Family found her once they arrived home. Patient denies any other complaints.  Past Medical History:  Diagnosis Date  . Diabetes mellitus without complication ([place])     There are no active problems to display for this patient.   History reviewed. No pertinent surgical history.   OB History   No obstetric history on file.      Home Medications    Prior to Admission medications   Medication Sig Start Date End Date Taking? Authorizing Provider  amLODipine (NORVASC) 5 MG tablet TAKE 1 TABLET BY MOUTH EVERY DAY ONCE A DAY ORALLY 90 DAYS 09/05/14   [provider]  aspirin (ASPIRIN EC) 81 MG EC tablet Take 81 mg by mouth daily. Swallow whole.    [provider]  atorvastatin (LIPITOR) 10 MG tablet 1 TABLET ONCE A DAY ORALLY 90 DAYS 10/11/14   [provider]  BAYER CONTOUR TEST test strip USE TO CHECK BLOOD SUGAR DAILY 10/21/14   [provider]  Calcium Carb-Cholecalciferol (CALCIUM 600 + D PO) Take by mouth.    [provider]  calcium carbonate (CALCIUM  600) 600 MG TABS tablet Take 600 mg by mouth 2 (two) times daily with a meal.    [provider]  COMBIGAN 0.2-0.5 % ophthalmic solution PLACE1 DROP IN BOTH EYES TWICE A DAY 11/10/14   [provider]  glimepiride (AMARYL) 2 MG tablet TAKE TAKE 1 TABLET BY MOUTH EVERY DAY WITH BREAKFAST ONCE A DAY ORALLY 11/10/14   [provider]  losartan (COZAAR) 100 MG tablet Take 100 mg by mouth daily. 11/11/14   [provider]  metFORMIN (GLUCOPHAGE) 500 MG tablet Take 500 mg by mouth 2 (two) times daily. 09/18/14   [provider]  Multiple Vitamin (MULTIVITAMIN) capsule Take 1 capsule by mouth daily.    [provider]  TRADJENTA 5 MG TABS tablet Take 5 mg by mouth daily. 09/05/14   [provider]    Family History No family history on file.  Social History Social History   Tobacco Use  . Smoking status: Never Smoker  Substance Use Topics  . Alcohol use: No  . Drug use: No     Allergies   Patient has no known allergies.   Review of Systems Review of Systems  Constitutional: Negative for fever.  Musculoskeletal: Positive for arthralgias and joint swelling.  All other systems reviewed and are negative.    Physical Exam Updated Vital Signs BP Marland Kitchen)  190/86 (BP Location: Right Arm)   Pulse 72   Resp 18   SpO2 92%   Physical Exam CONSTITUTIONAL: Elderly HEAD: Normocephalic/atraumatic, no signs of trauma EYES: EOMI/PERRL ENMT: Mucous membranes moist, no signs of trauma NECK: supple no meningeal signs SPINE/BACK:entire spine nontender, no bruising/crepitance/stepoffs noted to spine CV: S1/S2 noted, no murmurs/rubs/gallops noted LUNGS: Lungs are clear to auscultation bilaterally, no apparent distress Chest-no bruising or crepitus ABDOMEN: soft, nontender, no rebound or guarding, bowel sounds noted throughout abdomen GU:no cva tenderness NEURO: Pt is awake/alert, moves all extremitiesx4.  No facial droop.   EXTREMITIES:  Deformity and tenderness to left shoulder.  Deformity and tenderness to left wrist.  Distal pulses intact.  Small abrasion noted to ulnar aspect of left wrist.  Pelvis is stable.  All other extremities/joints palpated/ranged and nontender pulses normal/equal, full ROM SKIN: warm, color normal PSYCH: no abnormalities of mood noted, alert and oriented to situation   ED Treatments / Results  Labs (all labs ordered are listed, but only abnormal results are displayed) Labs Reviewed  SARS CORONAVIRUS 2 (HOSPITAL ORDER, [facility])  BASIC METABOLIC PANEL  CBC WITH DIFFERENTIAL/PLATELET  PROTIME-INR  CK  URINALYSIS, ROUTINE W REFLEX MICROSCOPIC  TYPE AND SCREEN    EKG   Radiology No results found.  Procedures Procedures   Medications Ordered in ED Medications  fentaNYL (SUBLIMAZE) injection 50 mcg (50 mcg Intravenous Given 12/28/18 0633)  Tdap (BOOSTRIX) injection 0.5 mL (0.5 mLs Intramuscular Given 12/28/18 [id number])     Initial Impression / Assessment and Plan / ED Course  I have reviewed the triage vital signs and the nursing notes.  Pertinent labs & imaging results that were available during my care of the patient were reviewed by me and considered in my medical decision making (see chart for details).        6:14 AM Patient presents after fall.  Apparently she was found outside after being down for several hours.  Her only complaints are left shoulder and left wrist pain and they are both clearly fractured. She has been given pain medicine in route and history is limited.  Will obtain CT head and C-spine as patient is instructed due to pain and also on IV narcotics. Attempted to call family and no response 7:00 AM D/w daughter Pt was found outside after falling Pt lives with other daughter and does well/ambulatory at home They would feel comfortable taking home if no other injuries except for left UE At signout to dr Regenia Skeeter, f/u on imaging  Likely need orthopedic consultation but may be amenable to discharge  Final Clinical Impressions(s) / ED Diagnoses   Final diagnoses:  Pain  Other closed displaced fracture of proximal end of left humerus, initial encounter  Closed fracture of distal end of left radius, unspecified fracture morphology, initial encounter    ED Discharge Orders    None       Ripley Fraise, MD 12/28/18 [phone]

## 2018-12-28 NOTE — Transfer of Care (Signed)
Immediate Anesthesia Transfer of Care Note  Patient: Vickie Young  Procedure(s) Performed: OPEN REDUCTION INTERNAL FIXATION (ORIF) PROXIMAL HUMERUS FRACTURE (Left Arm Upper) OPEN REDUCTION INTERNAL FIXATION (ORIF) WRIST FRACTURE (Left Wrist)  Patient Location: PACU  Anesthesia Type:General  Level of Consciousness: awake, alert  and patient cooperative  Airway & Oxygen Therapy: Patient Spontanous Breathing  Post-op Assessment: Report given to RN  Post vital signs: Reviewed and stable  Last Vitals:  Vitals Value Taken Time  BP 206/78 12/28/18 1727  Temp    Pulse 66 12/28/18 1728  Resp 16 12/28/18 1728  SpO2 100 % 12/28/18 1728  Vitals shown include unvalidated device data.  Last Pain:  Vitals:   12/28/18 1400  TempSrc:   PainSc: 4          Complications: No apparent anesthesia complications

## 2018-12-28 NOTE — ED Notes (Signed)
Patient transported to CT 

## 2018-12-28 NOTE — ED Provider Notes (Signed)
Care transferred to me.  Lab work is overall reassuring.  May be some mild dehydration and the leukocytosis is probably from the acute trauma given no other complaints.  Full range of motion of her left hip.  CT head and C-spine are benign.  Consulted orthopedics for the left humerus fracture and wrist fracture.  Hilbert Odor has seen patient and orthopedics is planning to repair both her humerus and wrist today.  Request medical admission.  Hospitalist, Dr. Lorin Mercy, consulted for admission.   Sherwood Gambler, MD 12/28/18 [phone]

## 2018-12-28 NOTE — H&P (Signed)
History and Physical    Vickie Young [id number] DOB: Nov 08, 1923 DOA: 12/28/2018  PCP: [name], MD Consultants:  None Patient coming from:  Home - lives with daughter; NOK: Daughter, Marlon Pel, [phone]  Chief Complaint: Fall  HPI: Vickie Young is a 85 y.o. female with medical history significant of DM presenting with a mechanical fall.  She apparently went outside at some point overnight and fell off a curb.  Her daughter found her on the ground about 0430 this AM.   Her daughter thinks she went out to check on the garbage cans since it is garbage day.  Her daughter thinks it would have been last night before bed since it would be unlike her to wander outside in the middle of the night.  Denies h/o dementia.  She apparently fell on her left arm without other injuries.     ED Course:  [place] last night, down all night. Humerus and wrist fractures.  To OR today.  Ortho wants clearance for surgery.  Review of Systems: As per HPI; otherwise review of systems reviewed and negative.  Severely hard of hearing without hearing aids, which limited her ROS.   Past Medical History:  Diagnosis Date   Breast cancer in female [facility])    remote   Diabetes mellitus without complication ([place])    Essential hypertension 12/28/2018   Glaucoma     Past Surgical History:  Procedure Laterality Date   herniated disc     remote   MASTECTOMY     remote    Social History   Socioeconomic History   Marital status: Widowed    Spouse name: Not on file   Number of children: Not on file   Years of education: Not on file   Highest education level: Not on file  Occupational History   Occupation: retired  Scientist, product/process development strain: 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 file   Food insecurity    Worry: Not on file    Inability: Not on Lexicographer needs    Medical: Not on file    Non-medical: Not on file  Tobacco Use   Smoking status: Never Smoker   Smokeless  tobacco: Current User  Substance and Sexual Activity   Alcohol use: No   Drug use: No   Sexual activity: Not on file  Lifestyle   Physical activity    Days per week: Not on file    Minutes per session: Not on file   Stress: Not on file  Relationships   Social connections    Talks on phone: Not on file    Gets together: Not on file    Attends religious service: Not on file    Active member of club or organization: Not on file    Attends meetings of clubs or organizations: Not on file    Relationship status: Not on file   Intimate partner violence    Fear of current or ex partner: Not on file    Emotionally abused: Not on file    Physically abused: Not on file    Forced sexual activity: Not on file  Other Topics Concern   Not on file  Social History Narrative   Not on file    No Known Allergies  History reviewed. No pertinent family history.  Prior to Admission medications   Medication Sig Start Date End Date Taking? Authorizing Provider  amLODipine (NORVASC) 5 MG tablet TAKE 1 TABLET BY MOUTH EVERY DAY ONCE A  DAY ORALLY 90 DAYS 09/05/14   [provider]  aspirin (ASPIRIN EC) 81 MG EC tablet Take 81 mg by mouth daily. Swallow whole.    [provider]  atorvastatin (LIPITOR) 10 MG tablet 1 TABLET ONCE A DAY ORALLY 90 DAYS 10/11/14   [provider]  BAYER CONTOUR TEST test strip USE TO CHECK BLOOD SUGAR DAILY 10/21/14   [provider]  Calcium Carb-Cholecalciferol (CALCIUM 600 + D PO) Take by mouth.    [provider]  calcium carbonate (CALCIUM 600) 600 MG TABS tablet Take 600 mg by mouth 2 (two) times daily with a meal.    [provider]  COMBIGAN 0.2-0.5 % ophthalmic solution PLACE1 DROP IN BOTH EYES TWICE A DAY 11/10/14   [provider]  glimepiride (AMARYL) 2 MG tablet TAKE TAKE 1 TABLET BY MOUTH EVERY DAY WITH BREAKFAST ONCE A DAY ORALLY 11/10/14   [provider]  losartan (COZAAR) 100 MG  tablet Take 100 mg by mouth daily. 11/11/14   [provider]  metFORMIN (GLUCOPHAGE) 500 MG tablet Take 500 mg by mouth 2 (two) times daily. 09/18/14   [provider]  Multiple Vitamin (MULTIVITAMIN) capsule Take 1 capsule by mouth daily.    [provider]  TRADJENTA 5 MG TABS tablet Take 5 mg by mouth daily. 09/05/14   [provider]    Physical Exam: Vitals:   12/28/18 1100 12/28/18 1255 12/28/18 1300 12/28/18 1305  BP: (!) 146/65  (!) 175/76 (!) 200/84  Pulse: 76 81 83 79  Resp: 14 17 16 15   Temp:      TempSrc:      SpO2: 100% 100% 100% 100%      General:  Appears calm and comfortable and is NAD  Eyes:  PERRL, EOMI, normal lids, iris  ENT:  Extremely hard of hearing, normal lips & tongue  Neck:  no LAD, masses or thyromegaly  Cardiovascular:  RRR, no m/r/g. No LE edema.   Respiratory:   CTA bilaterally with no wheezes/rales/rhonchi.  Normal respiratory effort.  Abdomen:  soft, NT, ND, NABS  Skin:  no rash or induration seen on limited exam other than as below  Musculoskeletal:  grossly normal tone BLE, effusion and pain of R proximal humerus, lower arm in splint  Lower extremity:  No LE edema.  Limited foot exam with no ulcerations.  2+ distal pulses.  Psychiatric:  grossly normal mood and affect, speech fluent and appropriate, AOx3  Neurologic:  CN 2-12 grossly intact, moves all extremities in coordinated fashion    Radiological Exams on Admission: Dg Wrist 2 Views Left  Result Date: 12/28/2018 CLINICAL DATA:  Fall. EXAM: LEFT WRIST - 1 VIEW COMPARISON:  Earlier the same day FINDINGS: A single lateral view (at clinician request) shows distal radius fracture with impaction and posterior displacement that is increased. No visible ulna fracture or wrist dislocation. IMPRESSION: Single lateral view shows worsening posterior displacement and impaction of the comminuted distal radius. Electronically Signed   By: Monte Fantasia M.D.    On: 12/28/2018 07:57   Dg Wrist 2 Views Left  Result Date: 12/28/2018 CLINICAL DATA:  Arm injury.  Pain EXAM: LEFT WRIST - 2 VIEW COMPARISON:  None. FINDINGS: Severely comminuted fracture distal radius. Fracture extends into the radiocarpal joint with multiple displaced fragments. Foreshortening of the distal radius. Possible tiny avulsion fracture of the ulnar styloid. No fracture in the carpal bones. IMPRESSION: Severely comminuted fracture distal radius. Electronically Signed   By:  Franchot Gallo M.D.   On: 12/28/2018 07:11   Ct Head Wo Contrast  Result Date: 12/28/2018 CLINICAL DATA:  Fall with head injury EXAM: CT HEAD WITHOUT CONTRAST CT CERVICAL SPINE WITHOUT CONTRAST TECHNIQUE: Multidetector CT imaging of the head and cervical spine was performed following the standard protocol without intravenous contrast. Multiplanar CT image reconstructions of the cervical spine were also generated. COMPARISON:  Head CT 01/25/2010 FINDINGS: CT HEAD FINDINGS Brain: No evidence of acute infarction, hemorrhage, hydrocephalus, extra-axial collection or mass lesion/mass effect. Atrophy and chronic small vessel ischemia, the latter moderate to extensive. Vascular: No hyperdense vessel or unexpected calcification. Skull: Negative for acute fracture Sinuses/Orbits: No evidence of injury.  Bilateral cataract resection CT CERVICAL SPINE FINDINGS Alignment: Normal Skull base and vertebrae: No acute fracture. No primary bone lesion or focal pathologic process. Soft tissues and spinal canal: No prevertebral fluid or swelling. No visible canal hematoma. Disc levels: Generalized degenerative disc narrowing and endplate ridging. Upper chest: Biapical scarring. Other: Left humerus fracture on scout radiograph, known from radiography. IMPRESSION: No evidence of acute intracranial or cervical spine injury. Electronically Signed   By: Monte Fantasia M.D.   On: 12/28/2018 07:53   Ct Cervical Spine Wo Contrast  Result Date:  12/28/2018 CLINICAL DATA:  Fall with head injury EXAM: CT HEAD WITHOUT CONTRAST CT CERVICAL SPINE WITHOUT CONTRAST TECHNIQUE: Multidetector CT imaging of the head and cervical spine was performed following the standard protocol without intravenous contrast. Multiplanar CT image reconstructions of the cervical spine were also generated. COMPARISON:  Head CT 01/25/2010 FINDINGS: CT HEAD FINDINGS Brain: No evidence of acute infarction, hemorrhage, hydrocephalus, extra-axial collection or mass lesion/mass effect. Atrophy and chronic small vessel ischemia, the latter moderate to extensive. Vascular: No hyperdense vessel or unexpected calcification. Skull: Negative for acute fracture Sinuses/Orbits: No evidence of injury.  Bilateral cataract resection CT CERVICAL SPINE FINDINGS Alignment: Normal Skull base and vertebrae: No acute fracture. No primary bone lesion or focal pathologic process. Soft tissues and spinal canal: No prevertebral fluid or swelling. No visible canal hematoma. Disc levels: Generalized degenerative disc narrowing and endplate ridging. Upper chest: Biapical scarring. Other: Left humerus fracture on scout radiograph, known from radiography. IMPRESSION: No evidence of acute intracranial or cervical spine injury. Electronically Signed   By: Monte Fantasia M.D.   On: 12/28/2018 07:53   Ct Shoulder Left Wo Contrast  Result Date: 12/28/2018 CLINICAL DATA:  Fall. EXAM: CT OF THE UPPER LEFT EXTREMITY WITHOUT CONTRAST TECHNIQUE: Multidetector CT imaging of the upper left extremity was performed according to the standard protocol. COMPARISON:  Left shoulder x-rays from same day. FINDINGS: Bones/Joint/Cartilage Acute transverse fracture of the proximal humeral diaphysis with greater than one shaft with anterior displacement. The humeral head is abducted. Minimal degenerative changes of the acromioclavicular joint. The glenohumeral joint space is preserved. Osteopenia. No joint effusion. Ligaments  Suboptimally assessed by CT. Muscles and Tendons Grossly intact.  No muscle atrophy. Soft tissues Soft tissue swelling and hemorrhage in the axilla. No soft tissue mass or fluid collection. IMPRESSION: 1. Acute displaced transverse fracture of the proximal humeral diaphysis. Electronically Signed   By: Titus Dubin M.D.   On: 12/28/2018 11:48   Dg Chest Port 1 View  Result Date: 12/28/2018 CLINICAL DATA:  Trauma with arm pain EXAM: PORTABLE CHEST 1 VIEW COMPARISON:  None. FINDINGS: Significantly displaced and over riding proximal left humerus fracture. Normal heart size. Mild aortic tortuosity. There is no edema, consolidation, effusion, or pneumothorax. Postoperative thoracic inlet, likely thyroidectomy. Osteopenia  IMPRESSION: 1. Displaced left proximal humerus fracture. 2. No evidence of active cardiopulmonary disease. Electronically Signed   By: Monte Fantasia M.D.   On: 12/28/2018 07:11   Dg Shoulder Left Port  Result Date: 12/28/2018 CLINICAL DATA:  Arm injury, pain EXAM: LEFT SHOULDER - 1 VIEW COMPARISON:  None. FINDINGS: Transverse fracture of the proximal humerus below the humeral neck. Greater than 1 shaft width of medial displacement. No other fracture. IMPRESSION: Medially displaced fracture proximal left humerus. Electronically Signed   By: Franchot Gallo M.D.   On: 12/28/2018 07:09    EKG: Independently reviewed.   0652 - Wide-QRS tachycardia with rate 159 0853 - NSR with rate 82; nonspecific ST changes with no evidence of acute ischemia   Labs on Admission: I have personally reviewed the available labs and imaging studies at the time of the admission.  Pertinent labs:   Glucose 229 BUN 18/Creatinine 1.14/GFR 41 CK 213 WBC 15.5 Hgb 11.2 UA: >500 glucose, small LE COVID negative   Assessment/Plan Principal Problem:   Humerus fracture Active Problems:   Glaucoma   Diabetes mellitus without complication (HCC)   Distal radius fracture   Essential hypertension     Acute proximal humerus fracture (displaced) and distal radius fractures on the left from a mechanical fall -Mechanical fall resulting in L arm fractures -Will need operative repair for both proximal humerus fracture and L radius fracture -Orthopedics consult  -NPO in anticipation of surgical repair -SCDs overnight, start Lovenox post-operatively (or as per ortho) -Pain control with Robxain, Vicodin, and Morphine prn -Will need PT consult post-operatively -Hip fracture order set utilized -Resume ASA in AM, post-operatively  DM -No recent A1c, but will not check since she is unlikely to suffer long-term effects of uncontrolled DM -Hold home meds including metformin, Tradjenta -Will cover with sensitive-scale SSI wthout qhs coverage  Glaucoma -Continue Travoprost (formulary substitution for Xalatan) and Combigan  HTN -Resume Norvasc in AM -Hold Micardis HCT for now    Note: This patient has been tested and is negative for the novel coronavirus COVID-19.    DVT prophylaxis:  SCDs until approved for Lovenox by orthopedics Code Status:  FULL - confirmed with patient Family Communication: Her daughter was present throughout the evaluation Disposition Plan:  Home once clinically improved Consults called: Orthopedics; CM; will need PT post-operatively  Admission status: It is my clinical opinion that referral for OBSERVATION is reasonable and necessary in this patient based on the above information provided. The aforementioned taken together are felt to place the patient at high risk for further clinical deterioration. However it is anticipated that the patient may be medically stable for discharge from the hospital within 24 to 48 hours.     Karmen Bongo MD [facility]   How to contact the [facility] Attending or Consulting provider [place] or covering provider during after hours [place], for this patient?  1. Check the care team in [facility] and look for a) attending/consulting  [facility] provider listed and b) the [facility] team listed 2. Log into [email or web address] and use [place]'s universal password to access. If you do not have the password, please contact the hospital operator. 3. Locate the [facility] provider you are looking for under [facility] and page to a number that you can be directly reached. 4. If you still have difficulty reaching the provider, please page the [facility] (Director on Call) for the Hospitalists listed on amion for assistance.   12/28/2018, 2:01 PM

## 2018-12-28 NOTE — Anesthesia Procedure Notes (Signed)
Procedure Name: Intubation Date/Time: 12/28/2018 1:29 PM Performed by: Barrington Ellison, CRNA Pre-anesthesia Checklist: Patient identified, Emergency Drugs available, Suction available and Patient being monitored Patient Re-evaluated:Patient Re-evaluated prior to induction Oxygen Delivery Method: Circle System Utilized Preoxygenation: Pre-oxygenation with 100% oxygen Induction Type: IV induction Ventilation: Mask ventilation without difficulty Laryngoscope Size: Mac and 3 Grade View: Grade I Tube type: Oral Tube size: 7.0 mm Number of attempts: 1 Airway Equipment and Method: Stylet and Oral airway Placement Confirmation: ETT inserted through vocal cords under direct vision,  positive ETCO2 and breath sounds checked- equal and bilateral Secured at: 21 cm Tube secured with: Tape Dental Injury: Teeth and Oropharynx as per pre-operative assessment

## 2018-12-28 NOTE — Discharge Planning (Signed)
Lezlee Gills J. Clydene Laming, RN, BSN, [employer] [phone] Spoke with pt and daughter at bedside regarding discharge planning for [facility]. Offered pt list of home health agencies to choose from.  Pt chose [facility] to render services. Glyn Ade of [facility] notified. Patient made aware that [facility] will be in contact in 24-48 hours.  No DME needs identified at this time.

## 2018-12-28 NOTE — Anesthesia Procedure Notes (Signed)
Anesthesia Regional Block: Supraclavicular block   Pre-Anesthetic Checklist: ,, timeout performed, Correct Patient, Correct Site, Correct Laterality, Correct Procedure, Correct Position, site marked, Risks and benefits discussed,  Surgical consent,  Pre-op evaluation,  At surgeon's request and post-op pain management  Laterality: Left  Prep: Maximum Sterile Barrier Precautions used, chloraprep       Needles:  Injection technique: Single-shot  Needle Type: Echogenic Stimulator Needle     Needle Length: 9cm  Needle Gauge: 22     Additional Needles:   Procedures:,,,, ultrasound used (permanent image in chart),,,,  Narrative:  Start time: 12/28/2018 12:55 PM End time: 12/28/2018 1:05 PM Injection made incrementally with aspirations every 5 mL.  Performed by: Personally  Anesthesiologist: Pervis Hocking, DO  Additional Notes: Monitors applied. No increased pain on injection. No increased resistance to injection. Injection made in 5cc increments. Good needle visualization. Patient tolerated procedure well.

## 2018-12-28 NOTE — Consult Note (Addendum)
Reason for Consult:Left humerus and wrist fxs Referring Physician: [name] is an 85 y.o. female.  HPI: Vickie Young was walking outside her home and stepped in a low place on her walk and tripped and fell onto her left side. She was unable to get up and lay there for several hours until her daughter came home. She was brought to the ED where x-rays showed a displaced humerus and wrist fxs and orthopedic surgery was consulted.  Pain worse with movement, rated as severe.  Better with rest.  Past Medical History:  Diagnosis Date  . Diabetes mellitus without complication ([place])     History reviewed. No pertinent surgical history.  History reviewed. No pertinent family history.  Social History:  reports that she has never smoked. She does not have any smokeless tobacco history on file. She reports that she does not drink alcohol or use drugs.  Allergies: No Known Allergies  Medications: I have reviewed the patient's current medications.  Results for orders placed or performed during the hospital encounter of 12/28/18 (from the past 48 hour(s))  SARS Coronavirus 2 [facility] order, Performed in [facility] hospital lab) Nasopharyngeal Nasopharyngeal Swab     Status: None   Collection Time: 12/28/18  6:32 AM   Specimen: Nasopharyngeal Swab  Result Value Ref Range   SARS Coronavirus 2 NEGATIVE NEGATIVE    Comment: (NOTE) If result is NEGATIVE SARS-CoV-2 target nucleic acids are NOT DETECTED. The SARS-CoV-2 RNA is generally detectable in upper and lower  respiratory specimens during the acute phase of infection. The lowest  concentration of SARS-CoV-2 viral copies this assay can detect is 250  copies / mL. A negative result does not preclude SARS-CoV-2 infection  and should not be used as the sole basis for treatment or other  patient management decisions.  A negative result may occur with  improper specimen collection / handling, submission of specimen other  than  nasopharyngeal swab, presence of viral mutation(s) within the  areas targeted by this assay, and inadequate number of viral copies  (<250 copies / mL). A negative result must be combined with clinical  observations, patient history, and epidemiological information. If result is POSITIVE SARS-CoV-2 target nucleic acids are DETECTED. The SARS-CoV-2 RNA is generally detectable in upper and lower  respiratory specimens dur ing the acute phase of infection.  Positive  results are indicative of active infection with SARS-CoV-2.  Clinical  correlation with patient history and other diagnostic information is  necessary to determine patient infection status.  Positive results do  not rule out bacterial infection or co-infection with other viruses. If result is PRESUMPTIVE POSTIVE SARS-CoV-2 nucleic acids MAY BE PRESENT.   A presumptive positive result was obtained on the submitted specimen  and confirmed on repeat testing.  While 2019 novel coronavirus  (SARS-CoV-2) nucleic acids may be present in the submitted sample  additional confirmatory testing may be necessary for epidemiological  and / or clinical management purposes  to differentiate between  SARS-CoV-2 and other Sarbecovirus currently known to infect humans.  If clinically indicated additional testing with an alternate test  methodology [phone]) is advised. The SARS-CoV-2 RNA is generally  detectable in upper and lower respiratory sp ecimens during the acute  phase of infection. The expected result is Negative. Fact Sheet for Patients:  StrictlyIdeas.no Fact Sheet for Healthcare Providers: BankingDealers.co.za This test is not yet approved or cleared by the [place] FDA and has been authorized for detection and/or diagnosis of SARS-CoV-2 by  FDA under an Emergency Use Authorization (EUA).  This EUA will remain in effect (meaning this test can be used) for the duration of  the COVID-19 declaration under Section 564(b)(1) of the Act, 21 U.S.C. section 360bbb-3(b)(1), unless the authorization is terminated or revoked sooner. Performed at [facility], [place] [address], [place], [place] [address]   Type and screen [place]     Status: None   Collection Time: 12/28/18  6:38 AM  Result Value Ref Range   ABO/RH(D) B POS    Antibody Screen NEG    Sample Expiration      12/31/2018,2359 Performed at [facility], [place] [address], [place], [place] [address]   ABO/Rh     Status: None   Collection Time: 12/28/18  6:38 AM  Result Value Ref Range   ABO/RH(D)      B POS Performed at [facility] [address], [place], [place] [id number]   Basic metabolic panel     Status: Abnormal   Collection Time: 12/28/18  6:48 AM  Result Value Ref Range   Sodium 139 135 - 145 mmol/L   Potassium 3.8 3.5 - 5.1 mmol/L   Chloride 106 98 - 111 mmol/L   CO2 24 22 - 32 mmol/L   Glucose, Bld 229 (H) 70 - 99 mg/dL   BUN 18 8 - 23 mg/dL   Creatinine, Ser 1.15 (H) 0.44 - 1.00 mg/dL   Calcium 9.5 8.9 - 10.3 mg/dL   GFR calc non Af Amer 41 (L) >60 mL/min   GFR calc Af Amer 47 (L) >60 mL/min   Anion gap 9 5 - 15    Comment: Performed at [facility] [address], [place], [place] [address]  CBC WITH DIFFERENTIAL     Status: Abnormal   Collection Time: 12/28/18  6:48 AM  Result Value Ref Range   WBC 15.5 (H) 4.0 - 10.5 K/uL   RBC 3.96 3.87 - 5.11 MIL/uL   Hemoglobin 11.2 (L) 12.0 - 15.0 g/dL   HCT 35.6 (L) 36.0 - 46.0 %   MCV 89.9 80.0 - 100.0 fL   MCH 28.3 26.0 - 34.0 pg   MCHC 31.5 30.0 - 36.0 g/dL   RDW 12.3 11.5 - 15.5 %   Platelets 284 150 - 400 K/uL   nRBC 0.0 0.0 - 0.2 %   Neutrophils Relative % 86 %   Neutro Abs 13.4 (H) 1.7 - 7.7 K/uL   Lymphocytes Relative 8 %   Lymphs Abs 1.2 0.7 - 4.0 K/uL   Monocytes Relative 4 %   Monocytes Absolute 0.7 0.1 - 1.0 K/uL   Eosinophils Relative 1 %   Eosinophils Absolute 0.1 0.0 -  0.5 K/uL   Basophils Relative 0 %   Basophils Absolute 0.0 0.0 - 0.1 K/uL   Immature Granulocytes 1 %   Abs Immature Granulocytes 0.11 (H) 0.00 - 0.07 K/uL    Comment: Performed at [facility], [address], [place], [place] [address]  Protime-INR     Status: None   Collection Time: 12/28/18  6:48 AM  Result Value Ref Range   Prothrombin Time 13.2 11.4 - 15.2 seconds   INR 1.0 0.8 - 1.2    Comment: (NOTE) INR goal varies based on device and disease states. Performed at [facility], [place] [address], [place], [place] [address]   CK     Status: None   Collection Time:  12/28/18  6:48 AM  Result Value Ref Range   Total CK 213 38 - 234 U/L    Comment: Performed at [facility], [place] [address], [place], [place] [address]  Urinalysis, Routine w reflex microscopic     Status: Abnormal   Collection Time: 12/28/18  6:48 AM  Result Value Ref Range   Color, Urine YELLOW YELLOW   APPearance CLEAR CLEAR   Specific Gravity, Urine 1.009 1.005 - 1.030   pH 7.0 5.0 - 8.0   Glucose, UA >=500 (A) NEGATIVE mg/dL   Hgb urine dipstick NEGATIVE NEGATIVE   Bilirubin Urine NEGATIVE NEGATIVE   Ketones, ur NEGATIVE NEGATIVE mg/dL   Protein, ur NEGATIVE NEGATIVE mg/dL   Nitrite NEGATIVE NEGATIVE   Leukocytes,Ua SMALL (A) NEGATIVE   RBC / HPF 0-5 0 - 5 RBC/hpf   WBC, UA 6-10 0 - 5 WBC/hpf   Bacteria, UA NONE SEEN NONE SEEN   Squamous Epithelial / LPF 0-5 0 - 5    Comment: Performed at [facility], [place] [address] [place], [place] [address]  Glucose, capillary     Status: Abnormal   Collection Time: 12/28/18 12:00 PM  Result Value Ref Range   Glucose-Capillary 180 (H) 70 - 99 mg/dL    Dg Wrist 2 Views Left  Result Date: 12/28/2018 CLINICAL DATA:  Fall. EXAM: LEFT WRIST - 1 VIEW COMPARISON:  Earlier the same day FINDINGS: A single lateral view (at clinician request) shows distal radius fracture with impaction and posterior displacement that is increased. No visible ulna  fracture or wrist dislocation. IMPRESSION: Single lateral view shows worsening posterior displacement and impaction of the comminuted distal radius. Electronically Signed   By: Monte Fantasia M.D.   On: 12/28/2018 07:57   Dg Wrist 2 Views Left  Result Date: 12/28/2018 CLINICAL DATA:  Arm injury.  Pain EXAM: LEFT WRIST - 2 VIEW COMPARISON:  None. FINDINGS: Severely comminuted fracture distal radius. Fracture extends into the radiocarpal joint with multiple displaced fragments. Foreshortening of the distal radius. Possible tiny avulsion fracture of the ulnar styloid. No fracture in the carpal bones. IMPRESSION: Severely comminuted fracture distal radius. Electronically Signed   By: Franchot Gallo M.D.   On: 12/28/2018 07:11   Ct Head Wo Contrast  Result Date: 12/28/2018 CLINICAL DATA:  Fall with head injury EXAM: CT HEAD WITHOUT CONTRAST CT CERVICAL SPINE WITHOUT CONTRAST TECHNIQUE: Multidetector CT imaging of the head and cervical spine was performed following the standard protocol without intravenous contrast. Multiplanar CT image reconstructions of the cervical spine were also generated. COMPARISON:  Head CT 01/25/2010 FINDINGS: CT HEAD FINDINGS Brain: No evidence of acute infarction, hemorrhage, hydrocephalus, extra-axial collection or mass lesion/mass effect. Atrophy and chronic small vessel ischemia, the latter moderate to extensive. Vascular: No hyperdense vessel or unexpected calcification. Skull: Negative for acute fracture Sinuses/Orbits: No evidence of injury.  Bilateral cataract resection CT CERVICAL SPINE FINDINGS Alignment: Normal Skull base and vertebrae: No acute fracture. No primary bone lesion or focal pathologic process. Soft tissues and spinal canal: No prevertebral fluid or swelling. No visible canal hematoma. Disc levels: Generalized degenerative disc narrowing and endplate ridging. Upper chest: Biapical scarring. Other: Left humerus fracture on scout radiograph, known from radiography.  IMPRESSION: No evidence of acute intracranial or cervical spine injury. Electronically Signed   By: Monte Fantasia M.D.   On: 12/28/2018 07:53   Ct Cervical Spine Wo Contrast  Result Date: 12/28/2018 CLINICAL DATA:  Fall with head injury EXAM: CT HEAD  WITHOUT CONTRAST CT CERVICAL SPINE WITHOUT CONTRAST TECHNIQUE: Multidetector CT imaging of the head and cervical spine was performed following the standard protocol without intravenous contrast. Multiplanar CT image reconstructions of the cervical spine were also generated. COMPARISON:  Head CT 01/25/2010 FINDINGS: CT HEAD FINDINGS Brain: No evidence of acute infarction, hemorrhage, hydrocephalus, extra-axial collection or mass lesion/mass effect. Atrophy and chronic small vessel ischemia, the latter moderate to extensive. Vascular: No hyperdense vessel or unexpected calcification. Skull: Negative for acute fracture Sinuses/Orbits: No evidence of injury.  Bilateral cataract resection CT CERVICAL SPINE FINDINGS Alignment: Normal Skull base and vertebrae: No acute fracture. No primary bone lesion or focal pathologic process. Soft tissues and spinal canal: No prevertebral fluid or swelling. No visible canal hematoma. Disc levels: Generalized degenerative disc narrowing and endplate ridging. Upper chest: Biapical scarring. Other: Left humerus fracture on scout radiograph, known from radiography. IMPRESSION: No evidence of acute intracranial or cervical spine injury. Electronically Signed   By: Monte Fantasia M.D.   On: 12/28/2018 07:53   Ct Shoulder Left Wo Contrast  Result Date: 12/28/2018 CLINICAL DATA:  Fall. EXAM: CT OF THE UPPER LEFT EXTREMITY WITHOUT CONTRAST TECHNIQUE: Multidetector CT imaging of the upper left extremity was performed according to the standard protocol. COMPARISON:  Left shoulder x-rays from same day. FINDINGS: Bones/Joint/Cartilage Acute transverse fracture of the proximal humeral diaphysis with greater than one shaft with anterior  displacement. The humeral head is abducted. Minimal degenerative changes of the acromioclavicular joint. The glenohumeral joint space is preserved. Osteopenia. No joint effusion. Ligaments Suboptimally assessed by CT. Muscles and Tendons Grossly intact.  No muscle atrophy. Soft tissues Soft tissue swelling and hemorrhage in the axilla. No soft tissue mass or fluid collection. IMPRESSION: 1. Acute displaced transverse fracture of the proximal humeral diaphysis. Electronically Signed   By: Titus Dubin M.D.   On: 12/28/2018 11:48   Dg Chest Port 1 View  Result Date: 12/28/2018 CLINICAL DATA:  Trauma with arm pain EXAM: PORTABLE CHEST 1 VIEW COMPARISON:  None. FINDINGS: Significantly displaced and over riding proximal left humerus fracture. Normal heart size. Mild aortic tortuosity. There is no edema, consolidation, effusion, or pneumothorax. Postoperative thoracic inlet, likely thyroidectomy. Osteopenia IMPRESSION: 1. Displaced left proximal humerus fracture. 2. No evidence of active cardiopulmonary disease. Electronically Signed   By: Monte Fantasia M.D.   On: 12/28/2018 07:11   Dg Shoulder Left Port  Result Date: 12/28/2018 CLINICAL DATA:  Arm injury, pain EXAM: LEFT SHOULDER - 1 VIEW COMPARISON:  None. FINDINGS: Transverse fracture of the proximal humerus below the humeral neck. Greater than 1 shaft width of medial displacement. No other fracture. IMPRESSION: Medially displaced fracture proximal left humerus. Electronically Signed   By: Franchot Gallo M.D.   On: 12/28/2018 07:09    Review of Systems  Constitutional: Negative for weight loss.  HENT: Negative for ear discharge, ear pain, hearing loss and tinnitus.   Eyes: Negative for blurred vision, double vision, photophobia and pain.  Respiratory: Negative for cough, sputum production and shortness of breath.   Cardiovascular: Negative for chest pain.  Gastrointestinal: Negative for abdominal pain, nausea and vomiting.  Genitourinary:  Negative for dysuria, flank pain, frequency and urgency.  Musculoskeletal: Positive for joint pain (Left shoulder and wrist). Negative for back pain, falls, myalgias and neck pain.  Neurological: Negative for dizziness, tingling, sensory change, focal weakness, loss of consciousness and headaches.  Endo/Heme/Allergies: Does not bruise/bleed easily.  Psychiatric/Behavioral: Negative for depression, memory loss and substance abuse. The patient is not nervous/anxious.  Blood pressure (!) 146/65, pulse 76, temperature 97.7 F (36.5 C), temperature source Oral, resp. rate 14, SpO2 100 %. Physical Exam  Constitutional: She appears well-developed and well-nourished. No distress.  HENT:  Head: Normocephalic and atraumatic.  Eyes: Conjunctivae are normal. Right eye exhibits no discharge. Left eye exhibits no discharge. No scleral icterus.  Neck: Normal range of motion.  Cardiovascular: Normal rate and regular rhythm.  Respiratory: Effort normal. No respiratory distress.  Musculoskeletal:     Comments: UEx shoulder, elbow, wrist, digits- no skin wounds, shoulder and wrist TTP, wrist edematous  Sens  Ax/R/M/U intact  Mot   Ax/ R/ PIN/ M/ AIN/ U intact  Rad 2+  Neurological: She is alert.  Skin: Skin is warm and dry. She is not diaphoretic.  Psychiatric: She has a normal mood and affect. Her behavior is normal.    Assessment/Plan: Left humerus fx -- Will need ORIF this afternoon by Dr. Mardelle Matte ~1300 Left wrist fx -- Will need ORIF this afternoon by Dr. Mardelle Matte Multiple medical problems including DM, HTN, HLD, and glaucoma -- Have asked IM to see and admit to clear for surgery and manage    Lisette Abu, PA-C Orthopedic Surgery [phone] 12/28/2018, 1:04 PM   Seen and agree with above.    The risks benefits and alternatives were discussed with the patient including but not limited to the risks of nonoperative treatment, versus surgical intervention including infection, bleeding,  nerve injury, malunion, nonunion, the need for revision surgery, hardware prominence, hardware failure, the need for hardware removal, blood clots, cardiopulmonary complications, morbidity, mortality, among others, and they were willing to proceed.  Spoke with Hoyle Sauer, daughter.  Johnny Bridge, MD

## 2018-12-29 ENCOUNTER — Observation Stay (HOSPITAL_COMMUNITY): Payer: Medicare Other

## 2018-12-29 DIAGNOSIS — S52572A Other intraarticular fracture of lower end of left radius, initial encounter for closed fracture: Secondary | ICD-10-CM | POA: Diagnosis present

## 2018-12-29 DIAGNOSIS — F05 Delirium due to known physiological condition: Secondary | ICD-10-CM | POA: Diagnosis not present

## 2018-12-29 DIAGNOSIS — W19XXXA Unspecified fall, initial encounter: Secondary | ICD-10-CM | POA: Diagnosis not present

## 2018-12-29 DIAGNOSIS — E876 Hypokalemia: Secondary | ICD-10-CM | POA: Diagnosis present

## 2018-12-29 DIAGNOSIS — S42292A Other displaced fracture of upper end of left humerus, initial encounter for closed fracture: Secondary | ICD-10-CM | POA: Diagnosis not present

## 2018-12-29 DIAGNOSIS — S52502A Unspecified fracture of the lower end of left radius, initial encounter for closed fracture: Secondary | ICD-10-CM | POA: Diagnosis not present

## 2018-12-29 DIAGNOSIS — H409 Unspecified glaucoma: Secondary | ICD-10-CM | POA: Diagnosis present

## 2018-12-29 DIAGNOSIS — Z7982 Long term (current) use of aspirin: Secondary | ICD-10-CM | POA: Diagnosis not present

## 2018-12-29 DIAGNOSIS — I959 Hypotension, unspecified: Secondary | ICD-10-CM | POA: Diagnosis not present

## 2018-12-29 DIAGNOSIS — S42309A Unspecified fracture of shaft of humerus, unspecified arm, initial encounter for closed fracture: Secondary | ICD-10-CM | POA: Diagnosis present

## 2018-12-29 DIAGNOSIS — Z23 Encounter for immunization: Secondary | ICD-10-CM | POA: Diagnosis not present

## 2018-12-29 DIAGNOSIS — Z4789 Encounter for other orthopedic aftercare: Secondary | ICD-10-CM | POA: Diagnosis not present

## 2018-12-29 DIAGNOSIS — S52502D Unspecified fracture of the lower end of left radius, subsequent encounter for closed fracture with routine healing: Secondary | ICD-10-CM | POA: Diagnosis not present

## 2018-12-29 DIAGNOSIS — S42202A Unspecified fracture of upper end of left humerus, initial encounter for closed fracture: Secondary | ICD-10-CM | POA: Diagnosis not present

## 2018-12-29 DIAGNOSIS — Z7984 Long term (current) use of oral hypoglycemic drugs: Secondary | ICD-10-CM | POA: Diagnosis not present

## 2018-12-29 DIAGNOSIS — W101XXA Fall (on)(from) sidewalk curb, initial encounter: Secondary | ICD-10-CM | POA: Diagnosis present

## 2018-12-29 DIAGNOSIS — S52612A Displaced fracture of left ulna styloid process, initial encounter for closed fracture: Secondary | ICD-10-CM | POA: Diagnosis present

## 2018-12-29 DIAGNOSIS — Z20828 Contact with and (suspected) exposure to other viral communicable diseases: Secondary | ICD-10-CM | POA: Diagnosis present

## 2018-12-29 DIAGNOSIS — R279 Unspecified lack of coordination: Secondary | ICD-10-CM | POA: Diagnosis not present

## 2018-12-29 DIAGNOSIS — Z9010 Acquired absence of unspecified breast and nipple: Secondary | ICD-10-CM | POA: Diagnosis not present

## 2018-12-29 DIAGNOSIS — D62 Acute posthemorrhagic anemia: Secondary | ICD-10-CM | POA: Diagnosis not present

## 2018-12-29 DIAGNOSIS — I10 Essential (primary) hypertension: Secondary | ICD-10-CM | POA: Diagnosis present

## 2018-12-29 DIAGNOSIS — M6281 Muscle weakness (generalized): Secondary | ICD-10-CM | POA: Diagnosis not present

## 2018-12-29 DIAGNOSIS — S52612D Displaced fracture of left ulna styloid process, subsequent encounter for closed fracture with routine healing: Secondary | ICD-10-CM | POA: Diagnosis not present

## 2018-12-29 DIAGNOSIS — Y92017 Garden or yard in single-family (private) house as the place of occurrence of the external cause: Secondary | ICD-10-CM | POA: Diagnosis not present

## 2018-12-29 DIAGNOSIS — R41841 Cognitive communication deficit: Secondary | ICD-10-CM | POA: Diagnosis not present

## 2018-12-29 DIAGNOSIS — R2681 Unsteadiness on feet: Secondary | ICD-10-CM | POA: Diagnosis not present

## 2018-12-29 DIAGNOSIS — S42322A Displaced transverse fracture of shaft of humerus, left arm, initial encounter for closed fracture: Secondary | ICD-10-CM | POA: Diagnosis present

## 2018-12-29 DIAGNOSIS — Z743 Need for continuous supervision: Secondary | ICD-10-CM | POA: Diagnosis not present

## 2018-12-29 DIAGNOSIS — R2689 Other abnormalities of gait and mobility: Secondary | ICD-10-CM | POA: Diagnosis not present

## 2018-12-29 DIAGNOSIS — G8918 Other acute postprocedural pain: Secondary | ICD-10-CM | POA: Diagnosis not present

## 2018-12-29 DIAGNOSIS — S42292S Other displaced fracture of upper end of left humerus, sequela: Secondary | ICD-10-CM | POA: Diagnosis not present

## 2018-12-29 DIAGNOSIS — R278 Other lack of coordination: Secondary | ICD-10-CM | POA: Diagnosis not present

## 2018-12-29 DIAGNOSIS — Z79899 Other long term (current) drug therapy: Secondary | ICD-10-CM | POA: Diagnosis not present

## 2018-12-29 DIAGNOSIS — E119 Type 2 diabetes mellitus without complications: Secondary | ICD-10-CM | POA: Diagnosis present

## 2018-12-29 DIAGNOSIS — Z853 Personal history of malignant neoplasm of breast: Secondary | ICD-10-CM | POA: Diagnosis not present

## 2018-12-29 DIAGNOSIS — S52502S Unspecified fracture of the lower end of left radius, sequela: Secondary | ICD-10-CM | POA: Diagnosis not present

## 2018-12-29 DIAGNOSIS — Y9301 Activity, walking, marching and hiking: Secondary | ICD-10-CM | POA: Diagnosis present

## 2018-12-29 DIAGNOSIS — E785 Hyperlipidemia, unspecified: Secondary | ICD-10-CM | POA: Diagnosis present

## 2018-12-29 LAB — CBC
HCT: 29.8 % — ABNORMAL LOW (ref 36.0–46.0)
Hemoglobin: 9.6 g/dL — ABNORMAL LOW (ref 12.0–15.0)
MCH: 28.1 pg (ref 26.0–34.0)
MCHC: 32.2 g/dL (ref 30.0–36.0)
MCV: 87.1 fL (ref 80.0–100.0)
Platelets: 249 10*3/uL (ref 150–400)
RBC: 3.42 MIL/uL — ABNORMAL LOW (ref 3.87–5.11)
RDW: 12.4 % (ref 11.5–15.5)
WBC: 10 10*3/uL (ref 4.0–10.5)
nRBC: 0 % (ref 0.0–0.2)

## 2018-12-29 LAB — GLUCOSE, CAPILLARY
Glucose-Capillary: 165 mg/dL — ABNORMAL HIGH (ref 70–99)
Glucose-Capillary: 149 mg/dL — ABNORMAL HIGH (ref 70–99)
Glucose-Capillary: 131 mg/dL — ABNORMAL HIGH (ref 70–99)

## 2018-12-29 NOTE — Progress Notes (Signed)
Orthopedic Tech Progress Note Patient Details:  Vickie Young Sep 22, 1923 [id number] RN said patient has on sling Patient ID: [name], female   DOB: [birth year], 85 y.o.   MRN: [id number]   [name] 12/29/2018, 11:24 AM

## 2018-12-29 NOTE — Progress Notes (Addendum)
Progress Note    Vickie Young  [id number] DOB: [birth year]  DOA: 12/28/2018 PCP: [name], MD    Brief Narrative:     Medical records reviewed and are as summarized below:  Vickie Young is an 85 y.o. female with medical history significant of DM presenting with a mechanical fall.  She apparently went outside at some point overnight and fell off a curb.  Her daughter found her on the ground about 0430 this AM.   Her daughter thinks she went out to check on the garbage cans since it is garbage day.  Her daughter thinks it would have been last night before bed since it would be unlike her to wander outside in the middle of the night.  Denies h/o dementia.  S/p OR on 9/11.  Assessment/Plan:   Principal Problem:   Closed fracture of left proximal humerus Active Problems:   Glaucoma   Diabetes mellitus without complication (HCC)   Closed fracture of left distal radius   Essential hypertension    Acute proximal humerus fracture (displaced) and distal radius fractures on the left from a mechanical fall -Mechanical fall resulting in L arm fractures -Orthopedics consult  -Pain control with Robxain, Vicodin, and Morphine prn -PT/OT ordered -s/p 1.  Open reduction internal fixation, left proximal humerus fracture 2.  Open reduction internal fixation, left distal radius fracture, greater than 3 pieces  Mild confusion overnight -confusion seems to have cleared this AM -continue to monitor-- not surprising as she is in the hospital s/p anesthesia  DM -No recent A1c, but will not check since she is unlikely to suffer long-term effects of uncontrolled DM -Hold home meds including metformin, Tradjenta -SSI  Glaucoma -Continue Travoprost (formulary substitution for Xalatan) and Combigan  HTN -Resume Norvasc  -Hold Micardis HCT for now  Leukocytosis -resolved -suspect reactive  Anemia NOS -appears to be her baseline -possibly hemo concentrated upon  admission  Family Communication/Anticipated D/C date and plan/Code Status   DVT prophylaxis: ASA (defer to ortho) Code Status: Full Code.  Family Communication: called daughter- sent to voicemail Disposition Plan: needs PT/OT and safe d/c plan-- suspect will need 24/7 supervision for now and I have not been able to confirm she will have this with family   Medical Consultants:    ortho  Subjective:   I missed the step at my house and fell  Objective:    Vitals:   12/29/18 0034 12/29/18 0410 12/29/18 0850 12/29/18 0902  BP: (!) 119/50 (!) 137/56 (!) 106/26 (!) 122/54  Pulse: 79 83 80 76  Resp: 20 16 15    Temp: 98.3 F (36.8 C) 98.8 F (37.1 C) 98.8 F (37.1 C)   TempSrc: Oral Oral Oral   SpO2: 100% 99% 100% 99%  Weight:      Height:        Intake/Output Summary (Last 24 hours) at 12/29/2018 1046 Last data filed at 12/28/2018 1710 Gross per 24 hour  Intake 1000 ml  Output 90 ml  Net 910 ml   Filed Weights   12/28/18 2017  Weight: 87.3 kg    Exam: Pleasant and cooperative Alert, appears comfortable rrr No increased work of breathing Left arm wrapped   Data Reviewed:   I have personally reviewed following labs and imaging studies:  Labs: Labs show the following:   Basic Metabolic Panel: Recent Labs  Lab 12/28/18 0648  NA 139  K 3.8  CL 106  CO2 24  GLUCOSE 229*  BUN 18  CREATININE 1.15*  CALCIUM 9.5   GFR Estimated Creatinine Clearance: 36.7 mL/min (A) (by C-G formula based on SCr of 1.15 mg/dL (H)). Liver Function Tests: No results for input(s): AST, ALT, ALKPHOS, BILITOT, PROT, ALBUMIN in the last 168 hours. No results for input(s): LIPASE, AMYLASE in the last 168 hours. No results for input(s): AMMONIA in the last 168 hours. Coagulation profile Recent Labs  Lab 12/28/18 0648  INR 1.0    CBC: Recent Labs  Lab 12/28/18 0648 12/29/18 0527  WBC 15.5* 10.0  NEUTROABS 13.4*  --   HGB 11.2* 9.6*  HCT 35.6* 29.8*  MCV 89.9 87.1    PLT 284 249   Cardiac Enzymes: Recent Labs  Lab 12/28/18 0648  CKTOTAL 213   BNP (last 3 results) No results for input(s): PROBNP in the last 8760 hours. CBG: Recent Labs  Lab 12/28/18 1200 12/28/18 1730  GLUCAP 180* 182*   D-Dimer: No results for input(s): DDIMER in the last 72 hours. Hgb A1c: No results for input(s): HGBA1C in the last 72 hours. Lipid Profile: No results for input(s): CHOL, HDL, LDLCALC, TRIG, CHOLHDL, LDLDIRECT in the last 72 hours. Thyroid function studies: No results for input(s): TSH, T4TOTAL, T3FREE, THYROIDAB in the last 72 hours.  Invalid input(s): FREET3 Anemia work up: No results for input(s): VITAMINB12, FOLATE, FERRITIN, TIBC, IRON, RETICCTPCT in the last 72 hours. Sepsis Labs: Recent Labs  Lab 12/28/18 0648 12/29/18 0527  WBC 15.5* 10.0    Microbiology Recent Results (from the past 240 hour(s))  SARS Coronavirus 2 [facility] order, Performed in [facility] hospital lab) Nasopharyngeal Nasopharyngeal Swab     Status: None   Collection Time: 12/28/18  6:32 AM   Specimen: Nasopharyngeal Swab  Result Value Ref Range Status   SARS Coronavirus 2 NEGATIVE NEGATIVE Final    Comment: (NOTE) If result is NEGATIVE SARS-CoV-2 target nucleic acids are NOT DETECTED. The SARS-CoV-2 RNA is generally detectable in upper and lower  respiratory specimens during the acute phase of infection. The lowest  concentration of SARS-CoV-2 viral copies this assay can detect is 250  copies / mL. A negative result does not preclude SARS-CoV-2 infection  and should not be used as the sole basis for treatment or other  patient management decisions.  A negative result may occur with  improper specimen collection / handling, submission of specimen other  than nasopharyngeal swab, presence of viral mutation(s) within the  areas targeted by this assay, and inadequate number of viral copies  (<250 copies / mL). A negative result must be combined with clinical   observations, patient history, and epidemiological information. If result is POSITIVE SARS-CoV-2 target nucleic acids are DETECTED. The SARS-CoV-2 RNA is generally detectable in upper and lower  respiratory specimens dur ing the acute phase of infection.  Positive  results are indicative of active infection with SARS-CoV-2.  Clinical  correlation with patient history and other diagnostic information is  necessary to determine patient infection status.  Positive results do  not rule out bacterial infection or co-infection with other viruses. If result is PRESUMPTIVE POSTIVE SARS-CoV-2 nucleic acids MAY BE PRESENT.   A presumptive positive result was obtained on the submitted specimen  and confirmed on repeat testing.  While 2019 novel coronavirus  (SARS-CoV-2) nucleic acids may be present in the submitted sample  additional confirmatory testing may be necessary for epidemiological  and / or clinical management purposes  to differentiate between  SARS-CoV-2 and other Sarbecovirus currently known to infect humans.  If  clinically indicated additional testing with an alternate test  methodology [phone]) is advised. The SARS-CoV-2 RNA is generally  detectable in upper and lower respiratory sp ecimens during the acute  phase of infection. The expected result is Negative. Fact Sheet for Patients:  StrictlyIdeas.no Fact Sheet for Healthcare Providers: BankingDealers.co.za This test is not yet approved or cleared by the [place] FDA and has been authorized for detection and/or diagnosis of SARS-CoV-2 by FDA under an Emergency Use Authorization (EUA).  This EUA will remain in effect (meaning this test can be used) for the duration of the COVID-19 declaration under Section 564(b)(1) of the Act, 21 U.S.C. section 360bbb-3(b)(1), unless the authorization is terminated or revoked sooner. Performed at [facility], [place] [address],  [place], [place] [address]     Procedures and diagnostic studies:  Dg Wrist 2 Views Left  Result Date: 12/28/2018 CLINICAL DATA:  Fall. EXAM: LEFT WRIST - 1 VIEW COMPARISON:  Earlier the same day FINDINGS: A single lateral view (at clinician request) shows distal radius fracture with impaction and posterior displacement that is increased. No visible ulna fracture or wrist dislocation. IMPRESSION: Single lateral view shows worsening posterior displacement and impaction of the comminuted distal radius. Electronically Signed   By: Monte Fantasia M.D.   On: 12/28/2018 07:57   Dg Wrist 2 Views Left  Result Date: 12/28/2018 CLINICAL DATA:  Arm injury.  Pain EXAM: LEFT WRIST - 2 VIEW COMPARISON:  None. FINDINGS: Severely comminuted fracture distal radius. Fracture extends into the radiocarpal joint with multiple displaced fragments. Foreshortening of the distal radius. Possible tiny avulsion fracture of the ulnar styloid. No fracture in the carpal bones. IMPRESSION: Severely comminuted fracture distal radius. Electronically Signed   By: Franchot Gallo M.D.   On: 12/28/2018 07:11   Ct Head Wo Contrast  Result Date: 12/28/2018 CLINICAL DATA:  Fall with head injury EXAM: CT HEAD WITHOUT CONTRAST CT CERVICAL SPINE WITHOUT CONTRAST TECHNIQUE: Multidetector CT imaging of the head and cervical spine was performed following the standard protocol without intravenous contrast. Multiplanar CT image reconstructions of the cervical spine were also generated. COMPARISON:  Head CT 01/25/2010 FINDINGS: CT HEAD FINDINGS Brain: No evidence of acute infarction, hemorrhage, hydrocephalus, extra-axial collection or mass lesion/mass effect. Atrophy and chronic small vessel ischemia, the latter moderate to extensive. Vascular: No hyperdense vessel or unexpected calcification. Skull: Negative for acute fracture Sinuses/Orbits: No evidence of injury.  Bilateral cataract resection CT CERVICAL SPINE FINDINGS Alignment: Normal Skull base  and vertebrae: No acute fracture. No primary bone lesion or focal pathologic process. Soft tissues and spinal canal: No prevertebral fluid or swelling. No visible canal hematoma. Disc levels: Generalized degenerative disc narrowing and endplate ridging. Upper chest: Biapical scarring. Other: Left humerus fracture on scout radiograph, known from radiography. IMPRESSION: No evidence of acute intracranial or cervical spine injury. Electronically Signed   By: Monte Fantasia M.D.   On: 12/28/2018 07:53   Ct Cervical Spine Wo Contrast  Result Date: 12/28/2018 CLINICAL DATA:  Fall with head injury EXAM: CT HEAD WITHOUT CONTRAST CT CERVICAL SPINE WITHOUT CONTRAST TECHNIQUE: Multidetector CT imaging of the head and cervical spine was performed following the standard protocol without intravenous contrast. Multiplanar CT image reconstructions of the cervical spine were also generated. COMPARISON:  Head CT 01/25/2010 FINDINGS: CT HEAD FINDINGS Brain: No evidence of acute infarction, hemorrhage, hydrocephalus, extra-axial collection or mass lesion/mass effect. Atrophy and chronic small vessel ischemia, the latter moderate to extensive. Vascular: No hyperdense vessel or unexpected calcification.  Skull: Negative for acute fracture Sinuses/Orbits: No evidence of injury.  Bilateral cataract resection CT CERVICAL SPINE FINDINGS Alignment: Normal Skull base and vertebrae: No acute fracture. No primary bone lesion or focal pathologic process. Soft tissues and spinal canal: No prevertebral fluid or swelling. No visible canal hematoma. Disc levels: Generalized degenerative disc narrowing and endplate ridging. Upper chest: Biapical scarring. Other: Left humerus fracture on scout radiograph, known from radiography. IMPRESSION: No evidence of acute intracranial or cervical spine injury. Electronically Signed   By: Monte Fantasia M.D.   On: 12/28/2018 07:53   Ct Shoulder Left Wo Contrast  Result Date: 12/28/2018 CLINICAL DATA:   Fall. EXAM: CT OF THE UPPER LEFT EXTREMITY WITHOUT CONTRAST TECHNIQUE: Multidetector CT imaging of the upper left extremity was performed according to the standard protocol. COMPARISON:  Left shoulder x-rays from same day. FINDINGS: Bones/Joint/Cartilage Acute transverse fracture of the proximal humeral diaphysis with greater than one shaft with anterior displacement. The humeral head is abducted. Minimal degenerative changes of the acromioclavicular joint. The glenohumeral joint space is preserved. Osteopenia. No joint effusion. Ligaments Suboptimally assessed by CT. Muscles and Tendons Grossly intact.  No muscle atrophy. Soft tissues Soft tissue swelling and hemorrhage in the axilla. No soft tissue mass or fluid collection. IMPRESSION: 1. Acute displaced transverse fracture of the proximal humeral diaphysis. Electronically Signed   By: Titus Dubin M.D.   On: 12/28/2018 11:48   Dg Chest Port 1 View  Result Date: 12/28/2018 CLINICAL DATA:  Trauma with arm pain EXAM: PORTABLE CHEST 1 VIEW COMPARISON:  None. FINDINGS: Significantly displaced and over riding proximal left humerus fracture. Normal heart size. Mild aortic tortuosity. There is no edema, consolidation, effusion, or pneumothorax. Postoperative thoracic inlet, likely thyroidectomy. Osteopenia IMPRESSION: 1. Displaced left proximal humerus fracture. 2. No evidence of active cardiopulmonary disease. Electronically Signed   By: Monte Fantasia M.D.   On: 12/28/2018 07:11   Dg Shoulder Left Port  Result Date: 12/28/2018 CLINICAL DATA:  Arm injury, pain EXAM: LEFT SHOULDER - 1 VIEW COMPARISON:  None. FINDINGS: Transverse fracture of the proximal humerus below the humeral neck. Greater than 1 shaft width of medial displacement. No other fracture. IMPRESSION: Medially displaced fracture proximal left humerus. Electronically Signed   By: Franchot Gallo M.D.   On: 12/28/2018 07:09   Dg Humerus Left  Result Date: 12/28/2018 CLINICAL DATA:  ORIF humerus  fracture EXAM: LEFT HUMERUS - 2+ VIEW; DG C-ARM 1-60 MIN COMPARISON:  12/28/2018 FINDINGS: Fracture proximal humerus has been reduced and fixed with metal plate and screws. Anatomic fracture alignment. IMPRESSION: Anatomic alignment following ORIF of proximal humeral fracture on the left. Electronically Signed   By: Franchot Gallo M.D.   On: 12/28/2018 15:39   Dg C-arm 1-60 Min  Result Date: 12/28/2018 CLINICAL DATA:  ORIF humerus fracture EXAM: LEFT HUMERUS - 2+ VIEW; DG C-ARM 1-60 MIN COMPARISON:  12/28/2018 FINDINGS: Fracture proximal humerus has been reduced and fixed with metal plate and screws. Anatomic fracture alignment. IMPRESSION: Anatomic alignment following ORIF of proximal humeral fracture on the left. Electronically Signed   By: Franchot Gallo M.D.   On: 12/28/2018 15:39    Medications:    acetaminophen  500 mg Oral Q6H   amLODipine  5 mg Oral Daily   aspirin EC  81 mg Oral Daily   brimonidine  1 drop Both Eyes BID   And   timolol  1 drop Both Eyes BID   docusate sodium  100 mg Oral BID   influenza  vaccine adjuvanted  0.5 mL Intramuscular Tomorrow-1000   insulin aspart  0-9 Units Subcutaneous TID WC   latanoprost  1 drop Both Eyes QHS   Continuous Infusions:  0.45 % NaCl with KCl 20 mEq / L 75 mL/hr at 12/29/18 0615    ceFAZolin (ANCEF) IV 1 g (12/29/18 0620)   methocarbamol (ROBAXIN) IV       LOS: 0 days   Geradine Girt  [facility]   How to contact the [facility] Attending or Consulting provider [place] or covering provider during after hours [place], for this patient?  1. Check the care team in [facility] and look for a) attending/consulting TR[facility] provider listed and b) the [facility] team listed 2. Log into [email or web address] and use [place]'s universal password to access. If you do not have the password, please contact the hospital operator. 3. Locate the [facility] provider you are looking for under [facility] and page to a number that you can be directly  reached. 4. If you still have difficulty reaching the provider, please page the [facility] (Director on Call) for the Hospitalists listed on amion for assistance.  12/29/2018, 10:46 AM

## 2018-12-29 NOTE — Evaluation (Signed)
Occupational Therapy Evaluation Patient Details Name: STARLENA PATA MRN: [id number] DOB: 02-03-1924 Today's Date: 12/29/2018    History of Present Illness 85 y.o. s/p Lt proximal humerus fx and ORIF left distal radius fracture due to a fall. Pt has glaucoma, DM, hypertension.    Clinical Impression   Pt s/p above. No family present and pt with decreased cognition. OT spoke with nurse about shoulder precautions. Recommending SNF for rehab.     Follow Up Recommendations  SNF;Supervision/Assistance - 24 hour    Equipment Recommendations  Other (comment)(TBD)    Recommendations for Other Services PT consult     Precautions / Restrictions Precautions Precautions: Shoulder;Fall Precaution Comments: no movement of shoulder (or elbow?), sling at all times except ADL Required Braces or Orthoses: Sling Restrictions Weight Bearing Restrictions: Yes LUE Weight Bearing: Non weight bearing      Mobility Bed Mobility Overal bed mobility: Needs Assistance Bed Mobility: Supine to Sit;Sit to Supine     Supine to sit: +2 for physical assistance;Max assist Sit to supine: Max assist      Transfers Overall transfer level: Needs assistance   Transfers: Sit to/from Stand Sit to Stand: +2 physical assistance;Max assist              Balance                                           ADL either performed or assessed with clinical judgement   ADL Overall ADL's : Needs assistance/impaired                         Toilet Transfer: Maximal assistance;+2 for physical assistance(sit to stand from bed-knees buckled)           Functional mobility during ADLs: +2 for physical assistance;Maximal assistance(sit to stand and knees buckled) General ADL Comments: OT adjusted sling and positoned rolled blanket behind pt's left arm in bed.      Vision         Perception     Praxis      Pertinent Vitals/Pain Pain Assessment: No/denies pain      Hand Dominance     Extremity/Trunk Assessment Upper Extremity Assessment Upper Extremity Assessment: LUE deficits/detail LUE Deficits / Details: in sling   Lower Extremity Assessment Lower Extremity Assessment: Defer to PT evaluation       Communication Communication Communication: HOH   Cognition Arousal/Alertness: Awake/alert Behavior During Therapy: WFL for tasks assessed/performed Overall Cognitive Status: No family/caregiver present to determine baseline cognitive functioning                                     General Comments       Exercises     Shoulder Instructions      Home Living Family/patient expects to be discharged to:: Unsure Living Arrangements: Children                               Additional Comments: no family present and pt not aware of where we were at or her bday      Prior Functioning/Environment          Comments: unsure of PLOF        OT Problem List:  Decreased strength;Decreased range of motion;Decreased cognition;Decreased safety awareness;Impaired UE functional use;Decreased knowledge of precautions;Decreased knowledge of use of DME or AE;Impaired balance (sitting and/or standing)      OT Treatment/Interventions: Self-care/ADL training;Therapeutic exercise;DME and/or AE instruction;Therapeutic activities;Cognitive remediation/compensation;Patient/family education;Balance training    OT Goals(Current goals can be found in the care plan section) Acute Rehab OT Goals Patient Stated Goal: not stated OT Goal Formulation: Patient unable to participate in goal setting Time For Goal Achievement: 01/05/19 Potential to Achieve Goals: Good ADL Goals Pt Will Transfer to Toilet: with mod assist;with +2 assist;stand pivot transfer Additional ADL Goal #1: Family will independently verbalize understanding of all shoulder precautions. Additional ADL Goal #2: Pt will perform bed mobility with Min A. Additional  ADL Goal #3: Pt's family will independently don/doff shoulder sling while maintaining shoulder precautions.  OT Frequency: Min 2X/week   Barriers to D/C:            Co-evaluation              AM-PAC OT "6 Clicks" Daily Activity     Outcome Measure Help from another person eating meals?: A Lot Help from another person taking care of personal grooming?: A Lot Help from another person toileting, which includes using toliet, bedpan, or urinal?: Total Help from another person bathing (including washing, rinsing, drying)?: A Lot Help from another person to put on and taking off regular upper body clothing?: A Lot Help from another person to put on and taking off regular lower body clothing?: Total 6 Click Score: 10   End of Session Equipment Utilized During Treatment: Other (comment)(shoulder sling) Nurse Communication: Other (comment)(nurse helped with session)  Activity Tolerance: Other (comment)(legs buckling/decreased cognition. ) Patient left: in bed;with call bell/phone within reach;with bed alarm set;with nursing/sitter in room  OT Visit Diagnosis: Unsteadiness on feet (R26.81)                Time: [id number] OT Time Calculation (min): 13 min Charges:  OT General Charges $OT Visit: 1 Visit OT Evaluation $OT Eval Moderate Complexity: 1 Mod   Elham Fini L Albana Saperstein  OTR/L 12/29/2018, 1:45 PM

## 2018-12-29 NOTE — Anesthesia Postprocedure Evaluation (Signed)
Anesthesia Post Note  Patient: Vickie Young  Procedure(s) Performed: OPEN REDUCTION INTERNAL FIXATION (ORIF) PROXIMAL HUMERUS FRACTURE (Left Arm Upper) OPEN REDUCTION INTERNAL FIXATION (ORIF) WRIST FRACTURE (Left Wrist)     Patient location during evaluation: PACU Anesthesia Type: General Level of consciousness: awake and alert Pain management: pain level controlled Vital Signs Assessment: post-procedure vital signs reviewed and stable Respiratory status: spontaneous breathing, nonlabored ventilation, respiratory function stable and patient connected to nasal cannula oxygen Cardiovascular status: blood pressure returned to baseline and stable Postop Assessment: no apparent nausea or vomiting Anesthetic complications: no    Last Vitals:  Vitals:   12/29/18 0850 12/29/18 0902  BP: (!) 106/26 (!) 122/54  Pulse: 80 76  Resp: 15   Temp: 37.1 C   SpO2: 100% 99%    Last Pain:  Vitals:   12/29/18 0850  TempSrc: Oral  PainSc:                  Tiajuana Amass

## 2018-12-29 NOTE — Progress Notes (Addendum)
Patient ID: Vickie Young, female   DOB: 05/09/1923, 85 y.o.   MRN: [id number]    Patient took splint off overnight and was confused and was apparently digging at her wrist wound.  Nursing reapplied dressings and splint in the middle of the night.  Subjective:  Patient reports pain as mild to moderate.  Patient in bed and follows commands sling and splint are in poor condition.   Objective:   VITALS:   Vitals:   12/29/18 1030 12/29/18 1143 12/29/18 1148 12/29/18 1242  BP: (!) 124/56 (!) 112/38 (!) 114/49 (!) 110/42  Pulse: 67 68 67   Resp: 20 14    Temp: 98.7 F (37.1 C) 98.3 F (36.8 C)    TempSrc: Oral Oral    SpO2: 97% 99% 99%   Weight:      Height:        ABD soft Sensation intact distally Dorsiflexion/Plantar flexion intact Incision: dressing C/D/I and no drainage Splint replace and sling repositioned   Lab Results  Component Value Date   WBC 10.0 12/29/2018   HGB 9.6 (L) 12/29/2018   HCT 29.8 (L) 12/29/2018   MCV 87.1 12/29/2018   PLT 249 12/29/2018   BMET    Component Value Date/Time   NA 139 12/28/2018 0648   K 3.8 12/28/2018 0648   CL 106 12/28/2018 0648   CO2 24 12/28/2018 0648   GLUCOSE 229 (H) 12/28/2018 0648   BUN 18 12/28/2018 0648   CREATININE 1.15 (H) 12/28/2018 0648   CALCIUM 9.5 12/28/2018 0648   GFRNONAA 41 (L) 12/28/2018 0648   GFRAA 47 (L) 12/28/2018 0648     Assessment/Plan: 1 Day Post-Op   Principal Problem:   Closed fracture of left proximal humerus Active Problems:   Glaucoma   Diabetes mellitus without complication (HCC)   Closed fracture of left distal radius   Essential hypertension   Advance diet Up with therapy Continue plan per medicine NWB left upper ext Sling and splint at all times   Lunette Stands 12/29/2018, 1:01 PM  Requiring max assist and high fall risk.  Unsafe discharge.  Inpatient.  Continue with PT.   Marchia Bond, MD Cell [phone]

## 2018-12-29 NOTE — Evaluation (Signed)
Physical Therapy Evaluation Patient Details Name: Vickie Young MRN: [id number] DOB: 1924-01-14 Today's Date: 12/29/2018   History of Present Illness  85 y.o. s/p Lt proximal humerus fx and ORIF left distal radius fracture due to a fall. Pt has glaucoma, DM, hypertension.   Clinical Impression  Patient required max a to stand and was unable to maintain standing. She will likely need a cane or hemi walker for gait training. She was unable to maintain standing for >10 seconds before her legs buckled. She is very weak at this time. She was unable to take a step. She required max a to get back into bed. She would benefit from rehab at a SNF. Skilled acute therapy will continue to progress patients mobility as tolerated.     Follow Up Recommendations SNF    Equipment Recommendations  (unknown what patient has at home )    Recommendations for Other Services Rehab consult     Precautions / Restrictions Precautions Precautions: Shoulder;Fall Precaution Comments: no movement of shoulder (or elbow?), sling at all times except ADL Required Braces or Orthoses: Sling Restrictions Weight Bearing Restrictions: Yes LUE Weight Bearing: Non weight bearing      Mobility  Bed Mobility Overal bed mobility: Needs Assistance Bed Mobility: Supine to Sit     Supine to sit: Mod assist Sit to supine: Max assist   General bed mobility comments: Mod a to get legs out of bed and to sit trunk up. Mod a to get bac into bed. Would have benefited from +2 assist  Transfers Overall transfer level: Needs assistance   Transfers: Sit to/from Stand Sit to Stand: +2 physical assistance;Max assist;+2 safety/equipment         General transfer comment: stood 2x with max a. Unable to maintian standing. Patients legs frequently buckled.   Ambulation/Gait                Stairs            Wheelchair Mobility    Modified Rankin (Stroke Patients Only)       Balance                                              Pertinent Vitals/Pain Pain Assessment: No/denies pain    Home Living Family/patient expects to be discharged to:: Skilled nursing facility Living Arrangements: Children               Additional Comments: no family present. Patient states she thinks she lives with her daughter     Prior Function           Comments: unsure. Patient thinks she used to use a walker      Hand Dominance   Dominant Hand: Right    Extremity/Trunk Assessment   Upper Extremity Assessment Upper Extremity Assessment: Defer to OT evaluation LUE Deficits / Details: in sling     Lower Extremity Assessment Lower Extremity Assessment: Generalized weakness       Communication   Communication: HOH  Cognition Arousal/Alertness: Awake/alert Behavior During Therapy: WFL for tasks assessed/performed Overall Cognitive Status: No family/caregiver present to determine baseline cognitive functioning                                 General Comments: Patient was confused but pleasent. She new she  was at the hosptial but did not recall the day. She remmebered her fall but did not rmemeber how it happenedn. She followed simple commds.       General Comments General comments (skin integrity, edema, etc.): left arm in sling     Exercises     Assessment/Plan    PT Assessment Patient needs continued PT services  PT Problem List Decreased strength;Decreased range of motion;Decreased activity tolerance;Decreased balance;Decreased knowledge of use of DME;Decreased safety awareness;Decreased knowledge of precautions;Decreased mobility;Decreased coordination       PT Treatment Interventions DME instruction;Gait training;Functional mobility training;Therapeutic activities;Therapeutic exercise;Patient/family education    PT Goals (Current goals can be found in the Care Plan section)  Acute Rehab PT Goals Patient Stated Goal: unable to state  PT Goal  Formulation: With patient Time For Goal Achievement: 01/12/19    Frequency Min 3X/week   Barriers to discharge Inaccessible home environment      Co-evaluation               AM-PAC PT "6 Clicks" Mobility  Outcome Measure Help needed turning from your back to your side while in a flat bed without using bedrails?: A Lot Help needed moving from lying on your back to sitting on the side of a flat bed without using bedrails?: A Lot Help needed moving to and from a bed to a chair (including a wheelchair)?: A Lot Help needed standing up from a chair using your arms (e.g., wheelchair or bedside chair)?: A Lot Help needed to walk in hospital room?: Total Help needed climbing 3-5 steps with a railing? : Total 6 Click Score: 10    End of Session Equipment Utilized During Treatment: Gait belt Activity Tolerance: Patient limited by fatigue Patient left: in bed;with bed alarm set;with nursing/sitter in room Nurse Communication: Mobility status PT Visit Diagnosis: Unsteadiness on feet (R26.81);Other abnormalities of gait and mobility (R26.89);Muscle weakness (generalized) (M62.81)    Time: [id number] PT Time Calculation (min) (ACUTE ONLY): 20 min   Charges:   PT Evaluation $PT Eval Moderate Complexity: 1 Mod            Carney Living PT DPT  12/29/2018, 3:56 PM

## 2018-12-29 NOTE — Progress Notes (Signed)
Pt awoke at approximately 0030, disoriented, and pulled off entire surgical dressing. Pt was found sitting on the side of her bed picking at her wrist incision. Pt was bathed, rebandaged, steri strip placed at wrist, island dressings over both incisions, splint placed, wrapped w/kerlix & ace bandages. Pt apologized, stated she woke up and forgot where she was. IV's replaced, On call orthopedic provider notified of incident.

## 2018-12-29 NOTE — Progress Notes (Signed)
Spoke with pt's Daughter Hoyle Sauer in pt's room and updated on pt's POC and explained why some of pt's medications were different. Pt's daughter also spoke to Dr. Eliseo Squires who explained reason why pt's medications were different and also updated daughter of Pt's POC.

## 2018-12-29 NOTE — Progress Notes (Signed)
Pt noted to be confused again, attempting to climb OOB; staff trying to reorient pt, placed back in bed.

## 2018-12-30 LAB — CBC
HCT: 29.8 % — ABNORMAL LOW (ref 36.0–46.0)
Hemoglobin: 9.6 g/dL — ABNORMAL LOW (ref 12.0–15.0)
MCH: 28 pg (ref 26.0–34.0)
MCHC: 32.2 g/dL (ref 30.0–36.0)
MCV: 86.9 fL (ref 80.0–100.0)
Platelets: 231 10*3/uL (ref 150–400)
RBC: 3.43 MIL/uL — ABNORMAL LOW (ref 3.87–5.11)
RDW: 12.5 % (ref 11.5–15.5)
WBC: 9.3 10*3/uL (ref 4.0–10.5)
nRBC: 0 % (ref 0.0–0.2)

## 2018-12-30 LAB — BASIC METABOLIC PANEL
Anion gap: 12 (ref 5–15)
BUN: 17 mg/dL (ref 8–23)
CO2: 22 mmol/L (ref 22–32)
Calcium: 8.3 mg/dL — ABNORMAL LOW (ref 8.9–10.3)
Chloride: 102 mmol/L (ref 98–111)
Creatinine, Ser: 1.04 mg/dL — ABNORMAL HIGH (ref 0.44–1.00)
GFR calc Af Amer: 54 mL/min — ABNORMAL LOW (ref >60)
GFR calc non Af Amer: 46 mL/min — ABNORMAL LOW (ref >60)
Glucose, Bld: 165 mg/dL — ABNORMAL HIGH (ref 70–99)
Potassium: 3.4 mmol/L — ABNORMAL LOW (ref 3.5–5.1)
Sodium: 136 mmol/L (ref 135–145)

## 2018-12-30 LAB — GLUCOSE, CAPILLARY
Glucose-Capillary: 140 mg/dL — ABNORMAL HIGH (ref 70–99)
Glucose-Capillary: 165 mg/dL — ABNORMAL HIGH (ref 70–99)
Glucose-Capillary: 158 mg/dL — ABNORMAL HIGH (ref 70–99)
Glucose-Capillary: 145 mg/dL — ABNORMAL HIGH (ref 70–99)

## 2018-12-30 MED ORDER — POTASSIUM CHLORIDE CRYS ER 20 MEQ PO TBCR
40.0000 meq | EXTENDED_RELEASE_TABLET | Freq: Once | ORAL | Status: AC
  Administered 2018-12-30: 09:00:00 40 meq via ORAL
  Filled 2018-12-30 (×2): qty 2

## 2018-12-30 MED ORDER — MORPHINE SULFATE (PF) 2 MG/ML IV SOLN: 0.5000 mg | Freq: Three times a day (TID) | INTRAVENOUS | Status: DC | PRN

## 2018-12-30 MED ORDER — HEPARIN SODIUM (PORCINE) 5000 UNIT/ML IJ SOLN
5000.0000 [IU] | Freq: Three times a day (TID) | INTRAMUSCULAR | Status: DC
  Administered 2018-12-30 – 2019-01-01 (×6): 5000 [IU] via SUBCUTANEOUS
  Filled 2018-12-30 (×7): qty 1

## 2018-12-30 MED ORDER — IRBESARTAN 75 MG PO TABS
75.0000 mg | ORAL_TABLET | Freq: Every day | ORAL | Status: DC
  Administered 2018-12-30 – 2019-01-01 (×3): 75 mg via ORAL
  Filled 2018-12-30 (×3): qty 1

## 2018-12-30 NOTE — Progress Notes (Signed)
Spoke to pt's daughter Marlon Pel [facility]) and updated on pt's POC. Pt's family has questions regarding POC and pt's PCP Dr. Shelia Media consulted on POC. Pt's daughters have questions regarding cbgs educated both daughters on reasoning of cbgs and insulin. POA aware to bring copy of POA paperwork.

## 2018-12-30 NOTE — Progress Notes (Signed)
PROGRESS NOTE                                                                                                                                                                                                             Patient Demographics:    Vickie Young, is a 85 y.o. female, DOB - [birth year], [id number]  Admit date - 12/28/2018   Admitting Physician Karmen Bongo, MD  Outpatient Primary MD for the patient is [name], MD  LOS - 1  Chief Complaint  Patient presents with   Arm Injury       Brief Narrative  Vickie Young is an 85 y.o. female with medical history significant ofDM presenting with a mechanical fall.She apparently went outside at some point overnight and fell off a curb. Her daughter found her on the ground about 0430 this AM. Her daughter thinks she went out to check on the garbage cans since it is garbage day. Her daughter thinks it would have been last night before bed since it would be unlike her to wander outside in the middle of the night. Denies h/o dementia.  S/p OR on 9/11.   Subjective:    Vickie Young today has, No headache, No chest pain, No abdominal pain - No Nausea, No new weakness tingling or numbness, No Cough - SOB.     Assessment  & Plan :      1.  Fall causing left-sided acute proximal humerus fracture with some displacement along with distal radius fracture - she is status post ORIF on 12/29/2018.  Seen by orthopedic surgeon Dr. Mardelle Matte, nonweightbearing left arm, left arm to remain in splint and sling throughout.  Will require SNF and outpatient follow-up with Dr. Mardelle Matte.  2.  Mild perioperative blood loss related anemia.  No acute issues monitor.  3.  Mild delirium, nonspecific encephalopathy.  Likely due to being in the hospital.  Supportive care.  Minimize narcotics and benzodiazepines.  4.  Glaucoma.  Continue eyedrops.  5.  Hypertension.  On Norvasc, will resume home dose ARB for better control.  6.   Hypokalemia.  Replaced.    7. DM type II.  On sliding scale.  Lab Results  Component Value Date   HGBA1C (H) 08/07/2007    7.8 (NOTE)   The ADA recommends the following therapeutic goals for glycemic   control related to Hgb A1C measurement:   Goal of Therapy:   < 7.0% Hgb A1C   Action Suggested:  > 8.0% Hgb  A1C   Ref:  Diabetes Care, 22, Suppl. 1, 1999   CBG (last 3)  Recent Labs    12/29/18 1654 12/29/18 2106 12/30/18 0630  GLUCAP 149* 131* 140*       Family Communication  : Called daughter on home and cell phone on 12/30/2018 at 10 AM.  No response.  Code Status : Full  Disposition Plan  : SNF  Consults  : Orthopedics  Procedures  : ORIF left humerus  DVT Prophylaxis  :  Heparin added  Lab Results  Component Value Date   PLT 231 12/30/2018    Diet :  Diet Order            Diet Carb Modified Fluid consistency: Thin; Room service appropriate? Yes  Diet effective now               Inpatient Medications Scheduled Meds:  amLODipine  5 mg Oral Daily   aspirin EC  81 mg Oral Daily   brimonidine  1 drop Both Eyes BID   And   timolol  1 drop Both Eyes BID   docusate sodium  100 mg Oral BID   influenza vaccine adjuvanted  0.5 mL Intramuscular Tomorrow-1000   insulin aspart  0-9 Units Subcutaneous TID WC   latanoprost  1 drop Both Eyes QHS   Continuous Infusions: PRN Meds:.acetaminophen, alum & mag hydroxide-simeth, bisacodyl, bisacodyl, HYDROcodone-acetaminophen, magnesium citrate, methocarbamol **OR** [DISCONTINUED] methocarbamol (ROBAXIN) IV, morphine injection, [DISCONTINUED] ondansetron **OR** ondansetron (ZOFRAN) IV, polyethylene glycol, traMADol, zolpidem  Antibiotics  :   Anti-infectives (From admission, onward)   Start     Dose/Rate Route Frequency Ordered Stop   12/28/18 2015  ceFAZolin (ANCEF) IVPB 1 g/50 mL premix     1 g 100 mL/hr over 30 Minutes Intravenous Every 6 hours 12/28/18 2005 12/29/18 1234   12/28/18 1230  ceFAZolin (ANCEF)  IVPB 2g/100 mL premix     2 g 200 mL/hr over 30 Minutes Intravenous On call to O.R. 12/28/18 1129 12/28/18 1330   12/28/18 1150  ceFAZolin (ANCEF) 2-4 GM/100ML-% IVPB    Note to Pharmacy: Lisette Grinder   : cabinet override      12/28/18 1150 12/28/18 1330          Objective:   Vitals:   12/29/18 1656 12/29/18 1951 12/30/18 0416 12/30/18 0819  BP: (!) 112/52 137/63 (!) 147/57 (!) 154/61  Pulse: 73 71 79 79  Resp: 15 19 18 15   Temp: 99.1 F (37.3 C) 100.2 F (37.9 C) 98.4 F (36.9 C) 99 F (37.2 C)  TempSrc: Oral Oral Oral Oral  SpO2: 97% 99% 99% 100%  Weight:      Height:        Wt Readings from Last 3 Encounters:  12/28/18 87.3 kg     Intake/Output Summary (Last 24 hours) at 12/30/2018 1004 Last data filed at 12/30/2018 0500 Gross per 24 hour  Intake 354 ml  Output 700 ml  Net -346 ml     Physical Exam  Awake Alert with minimal confusion, No new F.N deficits, Normal affect [place].AT,PERRAL Supple Neck,No JVD, No cervical lymphadenopathy appriciated.  Symmetrical Chest wall movement, Good air movement bilaterally, CTAB RRR,No Gallops,Rubs or new Murmurs, No Parasternal Heave +ve B.Sounds, Abd Soft, No tenderness, No organomegaly appriciated, No rebound - guarding or rigidity. No Cyanosis, left arm in sling    Data Review:    CBC Recent Labs  Lab 12/28/18 0648 12/29/18 0527 12/30/18 0446  WBC 15.5* 10.0 9.3  HGB 11.2* 9.6* 9.6*  HCT 35.6* 29.8* 29.8*  PLT 284 249 231  MCV 89.9 87.1 86.9  MCH 28.3 28.1 28.0  MCHC 31.5 32.2 32.2  RDW 12.3 12.4 12.5  LYMPHSABS 1.2  --   --   MONOABS 0.7  --   --   EOSABS 0.1  --   --   BASOSABS 0.0  --   --     Chemistries  Recent Labs  Lab 12/28/18 0648 12/30/18 0446  NA 139 136  K 3.8 3.4*  CL 106 102  CO2 24 22  GLUCOSE 229* 165*  BUN 18 17  CREATININE 1.15* 1.04*  CALCIUM 9.5 8.3*     ------------------------------------------------------------------------------------------------------------------ No results for input(s): TSH, T4TOTAL, T3FREE, THYROIDAB in the last 72 hours.  Invalid input(s): FREET3 ------------------------------------------------------------------------------------------------------------------ No results for input(s): VITAMINB12, FOLATE, FERRITIN, TIBC, IRON, RETICCTPCT in the last 72 hours.  Coagulation profile Recent Labs  Lab 12/28/18 0648  INR 1.0    No results for input(s): DDIMER in the last 72 hours.  Cardiac Enzymes No results for input(s): CKMB, TROPONINI, MYOGLOBIN in the last 168 hours.  Invalid input(s): CK ------------------------------------------------------------------------------------------------------------------ No results found for: BNP  Micro Results Recent Results (from the past 240 hour(s))  SARS Coronavirus 2 [facility] order, Performed in [facility] hospital lab) Nasopharyngeal Nasopharyngeal Swab     Status: None   Collection Time: 12/28/18  6:32 AM   Specimen: Nasopharyngeal Swab  Result Value Ref Range Status   SARS Coronavirus 2 NEGATIVE NEGATIVE Final    Comment: (NOTE) If result is NEGATIVE SARS-CoV-2 target nucleic acids are NOT DETECTED. The SARS-CoV-2 RNA is generally detectable in upper and lower  respiratory specimens during the acute phase of infection. The lowest  concentration of SARS-CoV-2 viral copies this assay can detect is 250  copies / mL. A negative result does not preclude SARS-CoV-2 infection  and should not be used as the sole basis for treatment or other  patient management decisions.  A negative result may occur with  improper specimen collection / handling, submission of specimen other  than nasopharyngeal swab, presence of viral mutation(s) within the  areas targeted by this assay, and inadequate number of viral copies  (<250 copies / mL). A negative result must be combined with  clinical  observations, patient history, and epidemiological information. If result is POSITIVE SARS-CoV-2 target nucleic acids are DETECTED. The SARS-CoV-2 RNA is generally detectable in upper and lower  respiratory specimens dur ing the acute phase of infection.  Positive  results are indicative of active infection with SARS-CoV-2.  Clinical  correlation with patient history and other diagnostic information is  necessary to determine patient infection status.  Positive results do  not rule out bacterial infection or co-infection with other viruses. If result is PRESUMPTIVE POSTIVE SARS-CoV-2 nucleic acids MAY BE PRESENT.   A presumptive positive result was obtained on the submitted specimen  and confirmed on repeat testing.  While 2019 novel coronavirus  (SARS-CoV-2) nucleic acids may be present in the submitted sample  additional confirmatory testing may be necessary for epidemiological  and / or clinical management purposes  to differentiate between  SARS-CoV-2 and other Sarbecovirus currently known to infect humans.  If clinically indicated additional testing with an alternate test  methodology [phone]) is advised. The SARS-CoV-2 RNA is generally  detectable in upper and lower respiratory sp ecimens during the acute  phase of infection. The expected result is Negative. Fact Sheet for Patients:  StrictlyIdeas.no Fact Sheet for  Healthcare Providers: BankingDealers.co.za This test is not yet approved or cleared by the [place] and has been authorized for detection and/or diagnosis of SARS-CoV-2 by FDA under an Emergency Use Authorization (EUA).  This EUA will remain in effect (meaning this test can be used) for the duration of the COVID-19 declaration under Section 564(b)(1) of the Act, 21 U.S.C. section 360bbb-3(b)(1), unless the authorization is terminated or revoked sooner. Performed at [facility], [place]  [address] [place], [place] [address]     Radiology Reports Dg Wrist 2 Views Left  Result Date: 12/29/2018 CLINICAL DATA:  Postoperative pain EXAM: LEFT WRIST - 2 VIEW COMPARISON:  Yesterday FINDINGS: ORIF of recent distal radius fracture with anatomic alignment and unremarkable volar plate. Ulnar styloid fracture with no significant displacement. Located wrist joint. IMPRESSION: Distal radius fracture reduction and fixation with no complicating feature. I Improved alignment of the ulnar styloid fracture. Electronically Signed   By: Monte Fantasia M.D.   On: 12/29/2018 14:23   Dg Wrist 2 Views Left  Result Date: 12/28/2018 CLINICAL DATA:  Fall. EXAM: LEFT WRIST - 1 VIEW COMPARISON:  Earlier the same day FINDINGS: A single lateral view (at clinician request) shows distal radius fracture with impaction and posterior displacement that is increased. No visible ulna fracture or wrist dislocation. IMPRESSION: Single lateral view shows worsening posterior displacement and impaction of the comminuted distal radius. Electronically Signed   By: Monte Fantasia M.D.   On: 12/28/2018 07:57   Dg Wrist 2 Views Left  Result Date: 12/28/2018 CLINICAL DATA:  Arm injury.  Pain EXAM: LEFT WRIST - 2 VIEW COMPARISON:  None. FINDINGS: Severely comminuted fracture distal radius. Fracture extends into the radiocarpal joint with multiple displaced fragments. Foreshortening of the distal radius. Possible tiny avulsion fracture of the ulnar styloid. No fracture in the carpal bones. IMPRESSION: Severely comminuted fracture distal radius. Electronically Signed   By: Franchot Gallo M.D.   On: 12/28/2018 07:11   Ct Head Wo Contrast  Result Date: 12/28/2018 CLINICAL DATA:  Fall with head injury EXAM: CT HEAD WITHOUT CONTRAST CT CERVICAL SPINE WITHOUT CONTRAST TECHNIQUE: Multidetector CT imaging of the head and cervical spine was performed following the standard protocol without intravenous contrast. Multiplanar CT image  reconstructions of the cervical spine were also generated. COMPARISON:  Head CT 01/25/2010 FINDINGS: CT HEAD FINDINGS Brain: No evidence of acute infarction, hemorrhage, hydrocephalus, extra-axial collection or mass lesion/mass effect. Atrophy and chronic small vessel ischemia, the latter moderate to extensive. Vascular: No hyperdense vessel or unexpected calcification. Skull: Negative for acute fracture Sinuses/Orbits: No evidence of injury.  Bilateral cataract resection CT CERVICAL SPINE FINDINGS Alignment: Normal Skull base and vertebrae: No acute fracture. No primary bone lesion or focal pathologic process. Soft tissues and spinal canal: No prevertebral fluid or swelling. No visible canal hematoma. Disc levels: Generalized degenerative disc narrowing and endplate ridging. Upper chest: Biapical scarring. Other: Left humerus fracture on scout radiograph, known from radiography. IMPRESSION: No evidence of acute intracranial or cervical spine injury. Electronically Signed   By: Monte Fantasia M.D.   On: 12/28/2018 07:53   Ct Cervical Spine Wo Contrast  Result Date: 12/28/2018 CLINICAL DATA:  Fall with head injury EXAM: CT HEAD WITHOUT CONTRAST CT CERVICAL SPINE WITHOUT CONTRAST TECHNIQUE: Multidetector CT imaging of the head and cervical spine was performed following the standard protocol without intravenous contrast. Multiplanar CT image reconstructions of the cervical spine were also generated. COMPARISON:  Head CT 01/25/2010 FINDINGS: CT HEAD FINDINGS Brain:  No evidence of acute infarction, hemorrhage, hydrocephalus, extra-axial collection or mass lesion/mass effect. Atrophy and chronic small vessel ischemia, the latter moderate to extensive. Vascular: No hyperdense vessel or unexpected calcification. Skull: Negative for acute fracture Sinuses/Orbits: No evidence of injury.  Bilateral cataract resection CT CERVICAL SPINE FINDINGS Alignment: Normal Skull base and vertebrae: No acute fracture. No primary bone  lesion or focal pathologic process. Soft tissues and spinal canal: No prevertebral fluid or swelling. No visible canal hematoma. Disc levels: Generalized degenerative disc narrowing and endplate ridging. Upper chest: Biapical scarring. Other: Left humerus fracture on scout radiograph, known from radiography. IMPRESSION: No evidence of acute intracranial or cervical spine injury. Electronically Signed   By: Monte Fantasia M.D.   On: 12/28/2018 07:53   Ct Shoulder Left Wo Contrast  Result Date: 12/28/2018 CLINICAL DATA:  Fall. EXAM: CT OF THE UPPER LEFT EXTREMITY WITHOUT CONTRAST TECHNIQUE: Multidetector CT imaging of the upper left extremity was performed according to the standard protocol. COMPARISON:  Left shoulder x-rays from same day. FINDINGS: Bones/Joint/Cartilage Acute transverse fracture of the proximal humeral diaphysis with greater than one shaft with anterior displacement. The humeral head is abducted. Minimal degenerative changes of the acromioclavicular joint. The glenohumeral joint space is preserved. Osteopenia. No joint effusion. Ligaments Suboptimally assessed by CT. Muscles and Tendons Grossly intact.  No muscle atrophy. Soft tissues Soft tissue swelling and hemorrhage in the axilla. No soft tissue mass or fluid collection. IMPRESSION: 1. Acute displaced transverse fracture of the proximal humeral diaphysis. Electronically Signed   By: Titus Dubin M.D.   On: 12/28/2018 11:48   Dg Chest Port 1 View  Result Date: 12/28/2018 CLINICAL DATA:  Trauma with arm pain EXAM: PORTABLE CHEST 1 VIEW COMPARISON:  None. FINDINGS: Significantly displaced and over riding proximal left humerus fracture. Normal heart size. Mild aortic tortuosity. There is no edema, consolidation, effusion, or pneumothorax. Postoperative thoracic inlet, likely thyroidectomy. Osteopenia IMPRESSION: 1. Displaced left proximal humerus fracture. 2. No evidence of active cardiopulmonary disease. Electronically Signed   By:  Monte Fantasia M.D.   On: 12/28/2018 07:11   Dg Shoulder Left Port  Result Date: 12/29/2018 CLINICAL DATA:  Postoperative pain. EXAM: LEFT SHOULDER - 1 VIEW COMPARISON:  Shoulder radiograph 12/28/2018 FINDINGS: Plate screw fixation of the proximal left humerus. Fracture fragments appear improved anatomic alignment. Left AC joint degenerative changes. Left hemithorax unremarkable. IMPRESSION: Improved anatomic alignment of the proximal left humerus status post ORIF with plate and screw fixation. Electronically Signed   By: Lovey Newcomer M.D.   On: 12/29/2018 14:20   Dg Shoulder Left Port  Result Date: 12/28/2018 CLINICAL DATA:  Arm injury, pain EXAM: LEFT SHOULDER - 1 VIEW COMPARISON:  None. FINDINGS: Transverse fracture of the proximal humerus below the humeral neck. Greater than 1 shaft width of medial displacement. No other fracture. IMPRESSION: Medially displaced fracture proximal left humerus. Electronically Signed   By: Franchot Gallo M.D.   On: 12/28/2018 07:09   Dg Humerus Left  Result Date: 12/28/2018 CLINICAL DATA:  ORIF humerus fracture EXAM: LEFT HUMERUS - 2+ VIEW; DG C-ARM 1-60 MIN COMPARISON:  12/28/2018 FINDINGS: Fracture proximal humerus has been reduced and fixed with metal plate and screws. Anatomic fracture alignment. IMPRESSION: Anatomic alignment following ORIF of proximal humeral fracture on the left. Electronically Signed   By: Franchot Gallo M.D.   On: 12/28/2018 15:39   Dg C-arm 1-60 Min  Result Date: 12/28/2018 CLINICAL DATA:  ORIF humerus fracture EXAM: LEFT HUMERUS - 2+ VIEW; DG C-ARM  1-60 MIN COMPARISON:  12/28/2018 FINDINGS: Fracture proximal humerus has been reduced and fixed with metal plate and screws. Anatomic fracture alignment. IMPRESSION: Anatomic alignment following ORIF of proximal humeral fracture on the left. Electronically Signed   By: Franchot Gallo M.D.   On: 12/28/2018 15:39    Time Spent in minutes  30   Lala Lund M.D on 12/30/2018 at 10:04  AM  To page go to [email or web address] - password [facility]

## 2018-12-30 NOTE — Progress Notes (Deleted)
Pt family requests that pt blood sugar is only checked in the mornings.

## 2018-12-30 NOTE — Progress Notes (Addendum)
Patient ID: Vickie Young, female   DOB: 1924/02/24, 85 y.o.   MRN: [id number]     Subjective:  Patient reports pain as mild.  Patient in the chair and in no acute distress.    Objective:   VITALS:   Vitals:   12/29/18 1656 12/29/18 1951 12/30/18 0416 12/30/18 0819  BP: (!) 112/52 137/63 (!) 147/57 (!) 154/61  Pulse: 73 71 79 79  Resp: 15 19 18 15   Temp: 99.1 F (37.3 C) 100.2 F (37.9 C) 98.4 F (36.9 C) 99 F (37.2 C)  TempSrc: Oral Oral Oral Oral  SpO2: 97% 99% 99% 100%  Weight:      Height:        ABD soft Sensation intact distally Dorsiflexion/Plantar flexion intact Incision: dressing C/D/I and no drainage Sling and splint in place  Lab Results  Component Value Date   WBC 9.3 12/30/2018   HGB 9.6 (L) 12/30/2018   HCT 29.8 (L) 12/30/2018   MCV 86.9 12/30/2018   PLT 231 12/30/2018   BMET    Component Value Date/Time   NA 136 12/30/2018 0446   K 3.4 (L) 12/30/2018 0446   CL 102 12/30/2018 0446   CO2 22 12/30/2018 0446   GLUCOSE 165 (H) 12/30/2018 0446   BUN 17 12/30/2018 0446   CREATININE 1.04 (H) 12/30/2018 0446   CALCIUM 8.3 (L) 12/30/2018 0446   GFRNONAA 46 (L) 12/30/2018 0446   GFRAA 54 (L) 12/30/2018 0446     Assessment/Plan: 2 Days Post-Op   Principal Problem:   Closed fracture of left proximal humerus Active Problems:   Glaucoma   Diabetes mellitus without complication (HCC)   Closed fracture of left distal radius   Essential hypertension   Humerus fracture   Advance diet Up with therapy Continue plan per medicine NWB left upper ext Continue sling and splint at all times   Lunette Stands 12/30/2018, 11:13 AM  Discussed and agree with above.   Marchia Bond, MD Cell [phone]

## 2018-12-30 NOTE — Progress Notes (Signed)
Nurse attempted to contact Pt power of attorney, her daughter Burman Freestone [phone], was uncsuccessful and a voice mail was left to return call to [place], RN 12/30/2018 1:22 PM

## 2018-12-31 ENCOUNTER — Encounter (HOSPITAL_COMMUNITY): Payer: Self-pay | Admitting: *Deleted

## 2018-12-31 LAB — BASIC METABOLIC PANEL
Anion gap: 7 (ref 5–15)
BUN: 15 mg/dL (ref 8–23)
CO2: 24 mmol/L (ref 22–32)
Calcium: 8.3 mg/dL — ABNORMAL LOW (ref 8.9–10.3)
Chloride: 106 mmol/L (ref 98–111)
Creatinine, Ser: 0.97 mg/dL (ref 0.44–1.00)
GFR calc Af Amer: 58 mL/min — ABNORMAL LOW (ref >60)
GFR calc non Af Amer: 50 mL/min — ABNORMAL LOW (ref >60)
Glucose, Bld: 140 mg/dL — ABNORMAL HIGH (ref 70–99)
Potassium: 4.2 mmol/L (ref 3.5–5.1)
Sodium: 137 mmol/L (ref 135–145)

## 2018-12-31 LAB — CBC
HCT: 28 % — ABNORMAL LOW (ref 36.0–46.0)
Hemoglobin: 8.9 g/dL — ABNORMAL LOW (ref 12.0–15.0)
MCH: 28.1 pg (ref 26.0–34.0)
MCHC: 31.8 g/dL (ref 30.0–36.0)
MCV: 88.3 fL (ref 80.0–100.0)
Platelets: 235 10*3/uL (ref 150–400)
RBC: 3.17 MIL/uL — ABNORMAL LOW (ref 3.87–5.11)
RDW: 12.3 % (ref 11.5–15.5)
WBC: 8 10*3/uL (ref 4.0–10.5)
nRBC: 0 % (ref 0.0–0.2)

## 2018-12-31 LAB — GLUCOSE, CAPILLARY
Glucose-Capillary: 147 mg/dL — ABNORMAL HIGH (ref 70–99)
Glucose-Capillary: 238 mg/dL — ABNORMAL HIGH (ref 70–99)
Glucose-Capillary: 163 mg/dL — ABNORMAL HIGH (ref 70–99)
Glucose-Capillary: 162 mg/dL — ABNORMAL HIGH (ref 70–99)

## 2018-12-31 NOTE — Progress Notes (Addendum)
Patient ID: Vickie Young, female   DOB: 1923-12-07, 85 y.o.   MRN: [id number]     Subjective:  Patient reports pain as mild.  Patient in bed and in no acute distress.  Denies any CP or SOB  Objective:   VITALS:   Vitals:   12/30/18 1750 12/30/18 1947 12/31/18 0330 12/31/18 0847  BP: (!) 130/51 (!) 119/57 (!) 136/59 (!) 177/75  Pulse: 68 72 71 72  Resp: 18 17 17 16   Temp: 98.2 F (36.8 C) 98.5 F (36.9 C) 98.2 F (36.8 C) 98.6 F (37 C)  TempSrc: Oral Oral Oral Oral  SpO2: 98% 99% 97% 98%  Weight:      Height:        ABD soft Sensation intact distally Dorsiflexion/Plantar flexion intact Incision: dressing C/D/I and no drainage Sling and splint in place  Lab Results  Component Value Date   WBC 8.0 12/31/2018   HGB 8.9 (L) 12/31/2018   HCT 28.0 (L) 12/31/2018   MCV 88.3 12/31/2018   PLT 235 12/31/2018   BMET    Component Value Date/Time   NA 137 12/31/2018 0402   K 4.2 12/31/2018 0402   CL 106 12/31/2018 0402   CO2 24 12/31/2018 0402   GLUCOSE 140 (H) 12/31/2018 0402   BUN 15 12/31/2018 0402   CREATININE 0.97 12/31/2018 0402   CALCIUM 8.3 (L) 12/31/2018 0402   GFRNONAA 50 (L) 12/31/2018 0402   GFRAA 58 (L) 12/31/2018 0402     Assessment/Plan: 3 Days Post-Op   Principal Problem:   Closed fracture of left proximal humerus Active Problems:   Glaucoma   Diabetes mellitus without complication (HCC)   Closed fracture of left distal radius   Essential hypertension   Humerus fracture   Advance diet Up with therapy Continue plan per medicine NWB left upper ext Continue sling and splint at all times   Lunette Stands 12/31/2018, 2:16 PM  Agree with above.  Discharge per primary team.   Marchia Bond, MD Cell [phone]

## 2018-12-31 NOTE — NC FL2 (Signed)
[place] MEDICAID FL2 LEVEL OF CARE SCREENING TOOL     IDENTIFICATION  Patient Name: Vickie Young Birthdate: Feb 21, 1924 Sex: female Admission Date (Current Location): 12/28/2018  [facility] and [place] Number:  Herbalist and Address:  [facility], [place] [address] [place], [place] [address]      Provider Number: [id number]  Attending Physician Name and Address:  Thurnell Lose, MD  Relative Name and Phone Number:  Marlon Pel [facility]) [phone]    Current Level of Care: Hospital Recommended Level of Care: [place] Prior Approval Number:    Date Approved/Denied:   PASRR Number: [id number]  Discharge Plan: Hospital    Current Diagnoses: Patient Active Problem List   Diagnosis Date Noted  . Humerus fracture 12/29/2018  . Closed fracture of left proximal humerus 12/28/2018  . Closed fracture of left distal radius 12/28/2018  . Essential hypertension 12/28/2018  . Glaucoma   . Diabetes mellitus without complication ([place])     Orientation RESPIRATION BLADDER Height & Weight     Self, Place  Normal Incontinent, External catheter Weight: 87.3 kg Height:  5\' 10"  (177.8 cm)  BEHAVIORAL SYMPTOMS/MOOD NEUROLOGICAL BOWEL NUTRITION STATUS  Other (Comment)(N/A) (N/A) Continent Diet(Carb Modified)  AMBULATORY STATUS COMMUNICATION OF NEEDS Skin   Extensive Assist Verbally Surgical wounds, Skin abrasions(Abrasion Left Hip; Open reduction internal fixation, left proximal humerus fracture)                       Personal Care Assistance Level of Assistance  Bathing, Feeding, Dressing Bathing Assistance: Maximum assistance Feeding assistance: Limited assistance Dressing Assistance: Maximum assistance     Functional Limitations Info  Hearing, Sight, Speech Sight Info: Impaired(glaucoma) Hearing Info: Impaired(HOH) Speech Info: Adequate    SPECIAL CARE FACTORS FREQUENCY  PT (By licensed PT), OT (By licensed OT)      PT Frequency: 3 times/week OT Frequency: 3 times/week            Contractures Contractures Info: Not present    Additional Factors Info  Code Status, Allergies Code Status Info: Full Code Allergies Info: NKDA           Current Medications (12/31/2018):  This is the current hospital active medication list Current Facility-Administered Medications  Medication Dose Route Frequency Provider Last Rate Last Dose  . acetaminophen (TYLENOL) tablet 325-650 mg  325-650 mg Oral Q6H PRN Marchia Bond, MD   650 mg at 12/30/18 1309  . alum & mag hydroxide-simeth (MAALOX/MYLANTA) 200-200-20 MG/5ML suspension 30 mL  30 mL Oral Q4H PRN Marchia Bond, MD      . amLODipine (NORVASC) tablet 5 mg  5 mg Oral Daily Marchia Bond, MD   5 mg at 12/31/18 0931  . aspirin EC tablet 81 mg  81 mg Oral Daily Marchia Bond, MD   81 mg at 12/31/18 0931  . bisacodyl (DULCOLAX) EC tablet 5 mg  5 mg Oral Daily PRN Marchia Bond, MD      . bisacodyl (DULCOLAX) suppository 10 mg  10 mg Rectal Daily PRN Marchia Bond, MD      . brimonidine (ALPHAGAN) 0.2 % ophthalmic solution 1 drop  1 drop Both Eyes BID Marchia Bond, MD   1 drop at 12/31/18 0938   And  . timolol (TIMOPTIC) 0.5 % ophthalmic solution 1 drop  1 drop Both Eyes BID Marchia Bond, MD   1 drop at 12/31/18 0937  . docusate sodium (COLACE) capsule 100 mg  100  mg Oral BID Marchia Bond, MD   100 mg at 12/31/18 0930  . heparin injection 5,000 Units  5,000 Units Subcutaneous Q8H Thurnell Lose, MD   5,000 Units at 12/31/18 0509  . HYDROcodone-acetaminophen (NORCO/VICODIN) 5-325 MG per tablet 1-2 tablet  1-2 tablet Oral Q4H PRN Thurnell Lose, MD      . insulin aspart (novoLOG) injection 0-9 Units  0-9 Units Subcutaneous TID WC Marchia Bond, MD   1 Units at 12/31/18 0746  . irbesartan (AVAPRO) tablet 75 mg  75 mg Oral Daily Thurnell Lose, MD   75 mg at 12/31/18 0931  . latanoprost (XALATAN) 0.005 % ophthalmic solution 1 drop  1 drop Both  Eyes QHS Marchia Bond, MD   1 drop at 12/30/18 2112  . magnesium citrate solution 1 Bottle  1 Bottle Oral Once PRN Marchia Bond, MD      . methocarbamol (ROBAXIN) tablet 500 mg  500 mg Oral Q6H PRN Marchia Bond, MD      . morphine 2 MG/ML injection 0.5 mg  0.5 mg Intravenous Q8H PRN Thurnell Lose, MD      . ondansetron (ZOFRAN) injection 4 mg  4 mg Intravenous Q6H PRN Marchia Bond, MD      . polyethylene glycol (MIRALAX / GLYCOLAX) packet 17 g  17 g Oral Daily PRN Marchia Bond, MD      . traMADol Veatrice Bourbon) tablet 50 mg  50 mg Oral Q6H PRN Marchia Bond, MD      . zolpidem (AMBIEN) tablet 5 mg  5 mg Oral QHS PRN Marchia Bond, MD         Discharge Medications: Please see discharge summary for a list of discharge medications.  Relevant Imaging Results:  Relevant Lab Results:   Additional Information SS#: [id number]; High Falls Risk; Left shoulder precautions  Midge Minium RN, BSN, NCM-BC, ACM-RN [phone]

## 2018-12-31 NOTE — Progress Notes (Signed)
Physical Therapy Treatment Patient Details Name: Vickie Young MRN: [id number] DOB: 22-Nov-1923 Today's Date: 12/31/2018    History of Present Illness 85 y.o. s/p Lt proximal humerus fx and ORIF left distal radius fracture due to a fall. Pt has glaucoma, DM, hypertension.     PT Comments    Patient seen for mobility progression. Pt is making progress toward PT goals and tolerated mobility well. Pt requires min A +2 for OOB mobility including gait training distance of 50 with SPC.  Continue to progress as tolerated.    Follow Up Recommendations  SNF     Equipment Recommendations  Other (comment)(TBD next venue)    Recommendations for Other Services       Precautions / Restrictions Precautions Precautions: Shoulder;Fall Precaution Comments: no movement of shoulder (or elbow?), sling at all times except ADL Required Braces or Orthoses: Sling Restrictions Weight Bearing Restrictions: Yes LUE Weight Bearing: Non weight bearing    Mobility  Bed Mobility Overal bed mobility: Needs Assistance Bed Mobility: Supine to Sit     Supine to sit: Min assist;+2 for safety/equipment     General bed mobility comments: assistance to elevate trunk into sitting and to scoot hips to EOB  Transfers Overall transfer level: Needs assistance Equipment used: Straight cane;1 person hand held assist Transfers: Sit to/from [employer] Transfers Sit to Stand: +2 physical assistance;+2 safety/equipment;Min assist Stand pivot transfers: Min assist;+2 physical assistance;+2 safety/equipment       General transfer comment: pt stood from EOB and BSC with assist to power up and cues for safe hand placement; use of SPC upon standing   Ambulation/Gait Ambulation/Gait assistance: Min assist;+2 safety/equipment(chair follow) Gait Distance (Feet): 50 Feet Assistive device: Straight cane Gait Pattern/deviations: Step-through pattern Gait velocity: decreased   General Gait Details: assist  for balance and cues to attend to L UE    Stairs             Wheelchair Mobility    Modified Rankin (Stroke Patients Only)       Balance Overall balance assessment: Needs assistance   Sitting balance-Leahy Scale: Good     Standing balance support: Single extremity supported Standing balance-Leahy Scale: Poor                              Cognition Arousal/Alertness: Awake/alert Behavior During Therapy: WFL for tasks assessed/performed Overall Cognitive Status: History of cognitive impairments - at baseline                                 General Comments: pt recalled fall and following commands consistently; repetition needed at times but likely due to [facility]; has hearing aids      Exercises      General Comments        Pertinent Vitals/Pain Pain Assessment: No/denies pain    Home Living                      Prior Function            PT Goals (current goals can now be found in the care plan section) Progress towards PT goals: Progressing toward goals    Frequency    Min 3X/week      PT Plan Current plan remains appropriate    Co-evaluation PT/OT/SLP Co-Evaluation/Treatment: Yes Reason for Co-Treatment: For patient/therapist safety;To address functional/ADL transfers PT goals  addressed during session: Mobility/safety with mobility;Proper use of DME        AM-PAC PT "6 Clicks" Mobility   Outcome Measure  Help needed turning from your back to your side while in a flat bed without using bedrails?: A Lot Help needed moving from lying on your back to sitting on the side of a flat bed without using bedrails?: A Lot Help needed moving to and from a bed to a chair (including a wheelchair)?: A Little Help needed standing up from a chair using your arms (e.g., wheelchair or bedside chair)?: A Little Help needed to walk in hospital room?: A Little Help needed climbing 3-5 steps with a railing? : A Lot 6 Click Score:  15    End of Session Equipment Utilized During Treatment: Gait belt Activity Tolerance: Patient tolerated treatment well Patient left: with bed alarm set;in chair;with chair alarm set;with family/visitor present Nurse Communication: Mobility status PT Visit Diagnosis: Unsteadiness on feet (R26.81);Other abnormalities of gait and mobility (R26.89);Muscle weakness (generalized) (M62.81)     Time: 1110-1140 PT Time Calculation (min) (ACUTE ONLY): 30 min  Charges:  $Gait Training: 8-22 mins                     Earney Navy, PTA Acute Rehabilitation Services Pager: [phone] Office: [phone]     Darliss Cheney 12/31/2018, 1:34 PM

## 2018-12-31 NOTE — TOC Progression Note (Signed)
Transition of Care [facility]) - Progression Note    Patient Details  Name: FATOU GEFFRARD MRN: [id number] Date of Birth: [birth year]  Transition of [facility]) CM/SW [place] RN, BSN, NCM-BC, [place] [phone] Phone Number: 12/31/2018, 3:50 PM  Clinical Narrative:    SNF Bed offer received from [facility] with a bed available on 01/01/19. CM spoke to [facility]) who's agreeable to the DCP; Dr. Candiss Norse notified of bed availability. [facility] transportation will be scheduled. CM team will continue to follow.   Expected Discharge Plan: Skilled Nursing Facility Barriers to Discharge: SNF Pending bed offer  Expected Discharge Plan and Services Expected Discharge Plan: [place]   Discharge Planning Services: CM Consult Post Acute Care Choice: [place] Living arrangements for the past 2 months: Single Family Home                 DME Arranged: N/A DME Agency: NA       HH Arranged: NA HH Agency: NA Date HH Agency Contacted: 12/28/18 Time HH Agency Contacted: 0900 Representative spoke with at [place]: Glyn Ade   Social Determinants of Health (SDOH) Interventions    Readmission Risk Interventions No flowsheet data found.

## 2018-12-31 NOTE — Progress Notes (Signed)
PROGRESS NOTE                                                                                                                                                                                                             Patient Demographics:    Vickie Young, is a 85 y.o. female, DOB - 04/07/1924, MR:[id number]  Admit date - 12/28/2018   Admitting Physician Karmen Bongo, MD  Outpatient Primary MD for the patient is [name], MD  LOS - 2  Chief Complaint  Patient presents with  . Arm Injury       Brief Narrative  Vickie Young is an 85 y.o. female with medical history significant ofDM presenting with a mechanical fall.She apparently went outside at some point overnight and fell off a curb. Her daughter found her on the ground about 0430 this AM. Her daughter thinks she went out to check on the garbage cans since it is garbage day. Her daughter thinks it would have been last night before bed since it would be unlike her to wander outside in the middle of the night. Denies h/o dementia.  S/p OR on 9/11.   Subjective:   Patient in bed, appears comfortable, denies any headache, no fever, no chest pain or pressure, no shortness of breath , no abdominal pain. No focal weakness.   Assessment  & Plan :     1.  Fall causing left-sided acute proximal humerus fracture with some displacement along with distal radius fracture - she is status post ORIF on 12/29/2018.  Seen by orthopedic surgeon Dr. Mardelle Matte, nonweightbearing left arm, left arm to remain in splint and sling throughout.  Will require SNF and outpatient follow-up with Dr. Mardelle Matte.  2.  Mild perioperative blood loss related anemia.  No acute issues monitor.  3.  Mild delirium, nonspecific encephalopathy.  Likely due to being in the hospital.  Supportive care.  Minimize narcotics and benzodiazepines.  4.  Glaucoma.  Continue eyedrops.  5.  Hypertension.  On Norvasc, will resume home dose ARB for better  control.  6.  Hypokalemia.  Replaced and stable.    7. DM type II.  On sliding scale.  Lab Results  Component Value Date   HGBA1C (H) 08/07/2007    7.8 (NOTE)   The ADA recommends the following therapeutic goals for glycemic   control related to Hgb A1C measurement:   Goal of Therapy:   < 7.0% Hgb A1C   Action Suggested:  > 8.0% Hgb A1C  Ref:  Diabetes Care, 22, Suppl. 1, 1999   CBG (last 3)  Recent Labs    12/30/18 1737 12/30/18 2120 12/31/18 0637  GLUCAP 158* 145* 147*       Family Communication  : Called daughter on home and cell phone on 12/30/2018 at 10 AM.  No response.  Code Status : Full  Disposition Plan  : SNF once bed available  Consults  : Orthopedics  Procedures  : ORIF left humerus  DVT Prophylaxis  :  Heparin added  Lab Results  Component Value Date   PLT 235 12/31/2018    Diet :  Diet Order            Diet Carb Modified Fluid consistency: Thin; Room service appropriate? Yes  Diet effective now               Inpatient Medications Scheduled Meds: . amLODipine  5 mg Oral Daily  . aspirin EC  81 mg Oral Daily  . brimonidine  1 drop Both Eyes BID   And  . timolol  1 drop Both Eyes BID  . docusate sodium  100 mg Oral BID  . heparin injection (subcutaneous)  5,000 Units Subcutaneous Q8H  . insulin aspart  0-9 Units Subcutaneous TID WC  . irbesartan  75 mg Oral Daily  . latanoprost  1 drop Both Eyes QHS   Continuous Infusions: PRN Meds:.acetaminophen, alum & mag hydroxide-simeth, bisacodyl, bisacodyl, HYDROcodone-acetaminophen, magnesium citrate, methocarbamol **OR** [DISCONTINUED] methocarbamol (ROBAXIN) IV, morphine injection, [DISCONTINUED] ondansetron **OR** ondansetron (ZOFRAN) IV, polyethylene glycol, traMADol, zolpidem  Antibiotics  :   Anti-infectives (From admission, onward)   Start     Dose/Rate Route Frequency Ordered Stop   12/28/18 2015  ceFAZolin (ANCEF) IVPB 1 g/50 mL premix     1 g 100 mL/hr over 30 Minutes Intravenous  Every 6 hours 12/28/18 2005 12/29/18 1234   12/28/18 1230  ceFAZolin (ANCEF) IVPB 2g/100 mL premix     2 g 200 mL/hr over 30 Minutes Intravenous On call to O.R. 12/28/18 1129 12/28/18 1330   12/28/18 1150  ceFAZolin (ANCEF) 2-4 GM/100ML-% IVPB    Note to Pharmacy: Lisette Grinder   : cabinet override      12/28/18 1150 12/28/18 1330          Objective:   Vitals:   12/30/18 1750 12/30/18 1947 12/31/18 0330 12/31/18 0847  BP: (!) 130/51 (!) 119/57 (!) 136/59 (!) 177/75  Pulse: 68 72 71 72  Resp: 18 17 17 16   Temp: 98.2 F (36.8 C) 98.5 F (36.9 C) 98.2 F (36.8 C) 98.6 F (37 C)  TempSrc: Oral Oral Oral Oral  SpO2: 98% 99% 97% 98%  Weight:      Height:        Wt Readings from Last 3 Encounters:  12/28/18 87.3 kg     Intake/Output Summary (Last 24 hours) at 12/31/2018 1114 Last data filed at 12/30/2018 1739 Gross per 24 hour  Intake 240 ml  Output 500 ml  Net -260 ml     Physical Exam  Awake , minimally confused, No new F.N deficits,   [place].AT,PERRAL Supple Neck,No JVD, No cervical lymphadenopathy appriciated.  Symmetrical Chest wall movement, Good air movement bilaterally, CTAB RRR,No Gallops, Rubs or new Murmurs, No Parasternal Heave +ve B.Sounds, Abd Soft, No tenderness, No organomegaly appriciated, No rebound - guarding or rigidity. No Cyanosis, Clubbing or edema, left arm in sling    Data Review:    CBC Recent Labs  Lab  12/28/18 [id number] 12/29/18 0527 12/30/18 0446 12/31/18 0402  WBC 15.5* 10.0 9.3 8.0  HGB 11.2* 9.6* 9.6* 8.9*  HCT 35.6* 29.8* 29.8* 28.0*  PLT 284 249 231 235  MCV 89.9 87.1 86.9 88.3  MCH 28.3 28.1 28.0 28.1  MCHC 31.5 32.2 32.2 31.8  RDW 12.3 12.4 12.5 12.3  LYMPHSABS 1.2  --   --   --   MONOABS 0.7  --   --   --   EOSABS 0.1  --   --   --   BASOSABS 0.0  --   --   --     Chemistries  Recent Labs  Lab 12/28/18 0648 12/30/18 0446 12/31/18 0402  NA 139 136 137  K 3.8 3.4* 4.2  CL 106 102 106  CO2 24 22 24   GLUCOSE  229* 165* 140*  BUN 18 17 15   CREATININE 1.15* 1.04* 0.97  CALCIUM 9.5 8.3* 8.3*    ------------------------------------------------------------------------------------------------------------------ No results for input(s): TSH, T4TOTAL, T3FREE, THYROIDAB in the last 72 hours.  Invalid input(s): FREET3 ------------------------------------------------------------------------------------------------------------------ No results for input(s): VITAMINB12, FOLATE, FERRITIN, TIBC, IRON, RETICCTPCT in the last 72 hours.  Coagulation profile Recent Labs  Lab 12/28/18 0648  INR 1.0    No results for input(s): DDIMER in the last 72 hours.  Cardiac Enzymes No results for input(s): CKMB, TROPONINI, MYOGLOBIN in the last 168 hours.  Invalid input(s): CK ------------------------------------------------------------------------------------------------------------------ No results found for: BNP  Micro Results Recent Results (from the past 240 hour(s))  SARS Coronavirus 2 [facility] order, Performed in [facility] hospital lab) Nasopharyngeal Nasopharyngeal Swab     Status: None   Collection Time: 12/28/18  6:32 AM   Specimen: Nasopharyngeal Swab  Result Value Ref Range Status   SARS Coronavirus 2 NEGATIVE NEGATIVE Final    Comment: (NOTE) If result is NEGATIVE SARS-CoV-2 target nucleic acids are NOT DETECTED. The SARS-CoV-2 RNA is generally detectable in upper and lower  respiratory specimens during the acute phase of infection. The lowest  concentration of SARS-CoV-2 viral copies this assay can detect is 250  copies / mL. A negative result does not preclude SARS-CoV-2 infection  and should not be used as the sole basis for treatment or other  patient management decisions.  A negative result may occur with  improper specimen collection / handling, submission of specimen other  than nasopharyngeal swab, presence of viral mutation(s) within the  areas targeted by this assay, and inadequate  number of viral copies  (<250 copies / mL). A negative result must be combined with clinical  observations, patient history, and epidemiological information. If result is POSITIVE SARS-CoV-2 target nucleic acids are DETECTED. The SARS-CoV-2 RNA is generally detectable in upper and lower  respiratory specimens dur ing the acute phase of infection.  Positive  results are indicative of active infection with SARS-CoV-2.  Clinical  correlation with patient history and other diagnostic information is  necessary to determine patient infection status.  Positive results do  not rule out bacterial infection or co-infection with other viruses. If result is PRESUMPTIVE POSTIVE SARS-CoV-2 nucleic acids MAY BE PRESENT.   A presumptive positive result was obtained on the submitted specimen  and confirmed on repeat testing.  While 2019 novel coronavirus  (SARS-CoV-2) nucleic acids may be present in the submitted sample  additional confirmatory testing may be necessary for epidemiological  and / or clinical management purposes  to differentiate between  SARS-CoV-2 and other Sarbecovirus currently known to infect humans.  If clinically indicated additional testing  with an alternate test  methodology [phone]) is advised. The SARS-CoV-2 RNA is generally  detectable in upper and lower respiratory sp ecimens during the acute  phase of infection. The expected result is Negative. Fact Sheet for Patients:  StrictlyIdeas.no Fact Sheet for Healthcare Providers: BankingDealers.co.za This test is not yet approved or cleared by the [place] FDA and has been authorized for detection and/or diagnosis of SARS-CoV-2 by FDA under an Emergency Use Authorization (EUA).  This EUA will remain in effect (meaning this test can be used) for the duration of the COVID-19 declaration under Section 564(b)(1) of the Act, 21 U.S.C. section 360bbb-3(b)(1), unless the  authorization is terminated or revoked sooner. Performed at [facility], [place] [address] [place], [place] [address]     Radiology Reports Dg Wrist 2 Views Left  Result Date: 12/29/2018 CLINICAL DATA:  Postoperative pain EXAM: LEFT WRIST - 2 VIEW COMPARISON:  Yesterday FINDINGS: ORIF of recent distal radius fracture with anatomic alignment and unremarkable volar plate. Ulnar styloid fracture with no significant displacement. Located wrist joint. IMPRESSION: Distal radius fracture reduction and fixation with no complicating feature. I Improved alignment of the ulnar styloid fracture. Electronically Signed   By: Monte Fantasia M.D.   On: 12/29/2018 14:23   Dg Wrist 2 Views Left  Result Date: 12/28/2018 CLINICAL DATA:  Fall. EXAM: LEFT WRIST - 1 VIEW COMPARISON:  Earlier the same day FINDINGS: A single lateral view (at clinician request) shows distal radius fracture with impaction and posterior displacement that is increased. No visible ulna fracture or wrist dislocation. IMPRESSION: Single lateral view shows worsening posterior displacement and impaction of the comminuted distal radius. Electronically Signed   By: Monte Fantasia M.D.   On: 12/28/2018 07:57   Dg Wrist 2 Views Left  Result Date: 12/28/2018 CLINICAL DATA:  Arm injury.  Pain EXAM: LEFT WRIST - 2 VIEW COMPARISON:  None. FINDINGS: Severely comminuted fracture distal radius. Fracture extends into the radiocarpal joint with multiple displaced fragments. Foreshortening of the distal radius. Possible tiny avulsion fracture of the ulnar styloid. No fracture in the carpal bones. IMPRESSION: Severely comminuted fracture distal radius. Electronically Signed   By: Franchot Gallo M.D.   On: 12/28/2018 07:11   Ct Head Wo Contrast  Result Date: 12/28/2018 CLINICAL DATA:  Fall with head injury EXAM: CT HEAD WITHOUT CONTRAST CT CERVICAL SPINE WITHOUT CONTRAST TECHNIQUE: Multidetector CT imaging of the head and cervical spine was performed  following the standard protocol without intravenous contrast. Multiplanar CT image reconstructions of the cervical spine were also generated. COMPARISON:  Head CT 01/25/2010 FINDINGS: CT HEAD FINDINGS Brain: No evidence of acute infarction, hemorrhage, hydrocephalus, extra-axial collection or mass lesion/mass effect. Atrophy and chronic small vessel ischemia, the latter moderate to extensive. Vascular: No hyperdense vessel or unexpected calcification. Skull: Negative for acute fracture Sinuses/Orbits: No evidence of injury.  Bilateral cataract resection CT CERVICAL SPINE FINDINGS Alignment: Normal Skull base and vertebrae: No acute fracture. No primary bone lesion or focal pathologic process. Soft tissues and spinal canal: No prevertebral fluid or swelling. No visible canal hematoma. Disc levels: Generalized degenerative disc narrowing and endplate ridging. Upper chest: Biapical scarring. Other: Left humerus fracture on scout radiograph, known from radiography. IMPRESSION: No evidence of acute intracranial or cervical spine injury. Electronically Signed   By: Monte Fantasia M.D.   On: 12/28/2018 07:53   Ct Cervical Spine Wo Contrast  Result Date: 12/28/2018 CLINICAL DATA:  Fall with head injury EXAM: CT HEAD WITHOUT CONTRAST CT  CERVICAL SPINE WITHOUT CONTRAST TECHNIQUE: Multidetector CT imaging of the head and cervical spine was performed following the standard protocol without intravenous contrast. Multiplanar CT image reconstructions of the cervical spine were also generated. COMPARISON:  Head CT 01/25/2010 FINDINGS: CT HEAD FINDINGS Brain: No evidence of acute infarction, hemorrhage, hydrocephalus, extra-axial collection or mass lesion/mass effect. Atrophy and chronic small vessel ischemia, the latter moderate to extensive. Vascular: No hyperdense vessel or unexpected calcification. Skull: Negative for acute fracture Sinuses/Orbits: No evidence of injury.  Bilateral cataract resection CT CERVICAL SPINE  FINDINGS Alignment: Normal Skull base and vertebrae: No acute fracture. No primary bone lesion or focal pathologic process. Soft tissues and spinal canal: No prevertebral fluid or swelling. No visible canal hematoma. Disc levels: Generalized degenerative disc narrowing and endplate ridging. Upper chest: Biapical scarring. Other: Left humerus fracture on scout radiograph, known from radiography. IMPRESSION: No evidence of acute intracranial or cervical spine injury. Electronically Signed   By: Monte Fantasia M.D.   On: 12/28/2018 07:53   Ct Shoulder Left Wo Contrast  Result Date: 12/28/2018 CLINICAL DATA:  Fall. EXAM: CT OF THE UPPER LEFT EXTREMITY WITHOUT CONTRAST TECHNIQUE: Multidetector CT imaging of the upper left extremity was performed according to the standard protocol. COMPARISON:  Left shoulder x-rays from same day. FINDINGS: Bones/Joint/Cartilage Acute transverse fracture of the proximal humeral diaphysis with greater than one shaft with anterior displacement. The humeral head is abducted. Minimal degenerative changes of the acromioclavicular joint. The glenohumeral joint space is preserved. Osteopenia. No joint effusion. Ligaments Suboptimally assessed by CT. Muscles and Tendons Grossly intact.  No muscle atrophy. Soft tissues Soft tissue swelling and hemorrhage in the axilla. No soft tissue mass or fluid collection. IMPRESSION: 1. Acute displaced transverse fracture of the proximal humeral diaphysis. Electronically Signed   By: Titus Dubin M.D.   On: 12/28/2018 11:48   Dg Chest Port 1 View  Result Date: 12/28/2018 CLINICAL DATA:  Trauma with arm pain EXAM: PORTABLE CHEST 1 VIEW COMPARISON:  None. FINDINGS: Significantly displaced and over riding proximal left humerus fracture. Normal heart size. Mild aortic tortuosity. There is no edema, consolidation, effusion, or pneumothorax. Postoperative thoracic inlet, likely thyroidectomy. Osteopenia IMPRESSION: 1. Displaced left proximal humerus  fracture. 2. No evidence of active cardiopulmonary disease. Electronically Signed   By: Monte Fantasia M.D.   On: 12/28/2018 07:11   Dg Shoulder Left Port  Result Date: 12/29/2018 CLINICAL DATA:  Postoperative pain. EXAM: LEFT SHOULDER - 1 VIEW COMPARISON:  Shoulder radiograph 12/28/2018 FINDINGS: Plate screw fixation of the proximal left humerus. Fracture fragments appear improved anatomic alignment. Left AC joint degenerative changes. Left hemithorax unremarkable. IMPRESSION: Improved anatomic alignment of the proximal left humerus status post ORIF with plate and screw fixation. Electronically Signed   By: Lovey Newcomer M.D.   On: 12/29/2018 14:20   Dg Shoulder Left Port  Result Date: 12/28/2018 CLINICAL DATA:  Arm injury, pain EXAM: LEFT SHOULDER - 1 VIEW COMPARISON:  None. FINDINGS: Transverse fracture of the proximal humerus below the humeral neck. Greater than 1 shaft width of medial displacement. No other fracture. IMPRESSION: Medially displaced fracture proximal left humerus. Electronically Signed   By: Franchot Gallo M.D.   On: 12/28/2018 07:09   Dg Humerus Left  Result Date: 12/28/2018 CLINICAL DATA:  ORIF humerus fracture EXAM: LEFT HUMERUS - 2+ VIEW; DG C-ARM 1-60 MIN COMPARISON:  12/28/2018 FINDINGS: Fracture proximal humerus has been reduced and fixed with metal plate and screws. Anatomic fracture alignment. IMPRESSION: Anatomic alignment following ORIF of  proximal humeral fracture on the left. Electronically Signed   By: Franchot Gallo M.D.   On: 12/28/2018 15:39   Dg C-arm 1-60 Min  Result Date: 12/28/2018 CLINICAL DATA:  ORIF humerus fracture EXAM: LEFT HUMERUS - 2+ VIEW; DG C-ARM 1-60 MIN COMPARISON:  12/28/2018 FINDINGS: Fracture proximal humerus has been reduced and fixed with metal plate and screws. Anatomic fracture alignment. IMPRESSION: Anatomic alignment following ORIF of proximal humeral fracture on the left. Electronically Signed   By: Franchot Gallo M.D.   On: 12/28/2018  15:39    Time Spent in minutes  30   Lala Lund M.D on 12/31/2018 at 11:14 AM  To page go to [email or web address] - password [facility]

## 2018-12-31 NOTE — Progress Notes (Signed)
Occupational Therapy Treatment Patient Details Name: Vickie Young MRN: [id number] DOB: 1923/08/12 Today's Date: 12/31/2018    History of present illness 85 y.o. s/p Lt proximal humerus fx and ORIF left distal radius fracture due to a fall. Pt has glaucoma, DM, hypertension.    OT comments  Pt progressing towards OT goals this session, Pt with dramatically improved ability to perform bed mobility (min A +2), transfers (min A +2). Reinforced no movement at shoulder, adjusted sling for comfort and support. Pt able to perform toilet transfer at min A +2 and perform standing peri care at min guard level. Pt's daughter present throughout session, and appreciative. Pt complaining about L hip pain with mobility in room with SPC. OT will continue to follow acutely and Current POC remains appropriate.   Follow Up Recommendations  SNF;Supervision/Assistance - 24 hour(Family Prefers [facility] in [place])    Equipment Recommendations  Other (comment)(defer to next venue of care)    Recommendations for Other Services PT consult    Precautions / Restrictions Precautions Precautions: Shoulder;Fall Precaution Comments: no movement of shoulder (or elbow?), sling at all times except ADL Required Braces or Orthoses: Sling Restrictions Weight Bearing Restrictions: Yes LUE Weight Bearing: Non weight bearing       Mobility Bed Mobility Overal bed mobility: Needs Assistance Bed Mobility: Supine to Sit     Supine to sit: Min assist;+2 for safety/equipment     General bed mobility comments: assistance to elevate trunk into sitting and to scoot hips to EOB  Transfers Overall transfer level: Needs assistance Equipment used: Straight cane;1 person hand held assist Transfers: Sit to/from [employer] Transfers Sit to Stand: +2 physical assistance;+2 safety/equipment;Min assist Stand pivot transfers: Min assist;+2 physical assistance;+2 safety/equipment       General transfer comment:  pt stood from EOB and BSC with assist to power up and cues for safe hand placement; use of SPC upon standing     Balance Overall balance assessment: Needs assistance   Sitting balance-Leahy Scale: Good     Standing balance support: Single extremity supported Standing balance-Leahy Scale: Poor                             ADL either performed or assessed with clinical judgement   ADL Overall ADL's : Needs assistance/impaired Eating/Feeding: Set up   Grooming: Minimal assistance;Sitting Grooming Details (indicate cue type and reason): Pt is right handed, and can perform some tasks when provided with good set up and recalling task at hand Upper Body Bathing: Moderate assistance   Lower Body Bathing: Maximal assistance   Upper Body Dressing : Maximal assistance   Lower Body Dressing: Maximal assistance   Toilet Transfer: +2 for safety/equipment;Moderate assistance;Minimal assistance;Ambulation(cane)   Toileting- Clothing Manipulation and Hygiene: Min guard;Sit to/from stand Toileting - Clothing Manipulation Details (indicate cue type and reason): able to stand with min A for boost, and then perform peri care in front and back     Functional mobility during ADLs: Minimal assistance;+2 for safety/equipment;Cane(chair follow) General ADL Comments: OT adjusted sling and positoned pillows in chair at end of session to support LUE     Vision       Perception     Praxis      Cognition Arousal/Alertness: Awake/alert Behavior During Therapy: WFL for tasks assessed/performed Overall Cognitive Status: History of cognitive impairments - at baseline  General Comments: pt recalled fall and following commands consistently; repetition needed at times but likely due to [facility]; has hearing aids        Exercises     Shoulder Instructions       General Comments      Pertinent Vitals/ Pain       Pain Assessment: Faces Faces  Pain Scale: Hurts a little bit Pain Location: L hip with mobility Pain Descriptors / Indicators: Sore Pain Intervention(s): Limited activity within patient's tolerance;Monitored during session;Repositioned  Home Living                                          Prior Functioning/Environment              Frequency  Min 2X/week        Progress Toward Goals  OT Goals(current goals can now be found in the care plan section)  Progress towards OT goals: Progressing toward goals  Acute Rehab OT Goals Patient Stated Goal: get better OT Goal Formulation: With patient/family Time For Goal Achievement: 01/05/19 Potential to Achieve Goals: Good  Plan Discharge plan remains appropriate;Frequency remains appropriate    Co-evaluation    PT/OT/SLP Co-Evaluation/Treatment: Yes Reason for Co-Treatment: For patient/therapist safety;To address functional/ADL transfers PT goals addressed during session: Mobility/safety with mobility;Balance OT goals addressed during session: ADL's and self-care;Proper use of Adaptive equipment and DME      AM-PAC OT "6 Clicks" Daily Activity     Outcome Measure   Help from another person eating meals?: A Little Help from another person taking care of personal grooming?: A Little(seated) Help from another person toileting, which includes using toliet, bedpan, or urinal?: A Lot Help from another person bathing (including washing, rinsing, drying)?: A Lot Help from another person to put on and taking off regular upper body clothing?: A Lot Help from another person to put on and taking off regular lower body clothing?: A Lot 6 Click Score: 14    End of Session Equipment Utilized During Treatment: Other (comment)(sling)  OT Visit Diagnosis: Unsteadiness on feet (R26.81)   Activity Tolerance Patient tolerated treatment well   Patient Left in chair;with chair alarm set;with call bell/phone within reach;with family/visitor  present(daughter [name])   Nurse Communication Mobility status        Time: 1110-1140 OT Time Calculation (min): 30 min  Charges: OT General Charges $OT Visit: 1 Visit OT Treatments $Self Care/Home Management : 8-22 mins  Hulda Humphrey OTR/L Acute Rehabilitation Services Pager: [phone] Office: [place] 12/31/2018, 2:38 PM

## 2018-12-31 NOTE — Care Management (Signed)
CM left a voice message with Marlon Pel (POA) @ [phone] to discuss ST SNF options for DCP. Awaiting a return call.  Midge Minium RN, BSN, NCM-BC, ACM-RN [phone]

## 2018-12-31 NOTE — TOC Progression Note (Signed)
Transition of Care [facility]) - Progression Note    Patient Details  Name: Vickie Young MRN: [id number] Date of Birth: 12/23/1923  Transition of Care [facility]) CM/SW [place] RN, BSN, NCM-BC, [place] [phone] Phone Number: 12/31/2018, 2:10 PM  Clinical Narrative:    CM following for DCP needs for ST SNF. CM met with the patients daughter/POA Marlon Pel to discuss the SNF process and provided the daughter with a CMS SNF Compare list. CM received preferences with the FL2 completed and faxed out. Patient is medically stable to transition to a ST SNF pending bed offers. CM team will continue to follow.    Expected Discharge Plan: Skilled Nursing Facility Barriers to Discharge: SNF Pending bed offer  Expected Discharge Plan and Services Expected Discharge Plan: [place]   Discharge Planning Services: CM Consult Post Acute Care Choice: [place] Living arrangements for the past 2 months: Single Family Home                 DME Arranged: N/A DME Agency: NA       HH Arranged: NA HH Agency: NA Date HH Agency Contacted: 12/28/18 Time HH Agency Contacted: 0900 Representative spoke with at [place]: Glyn Ade   Social Determinants of Health (SDOH) Interventions    Readmission Risk Interventions No flowsheet data found.

## 2019-01-01 DIAGNOSIS — S52502S Unspecified fracture of the lower end of left radius, sequela: Secondary | ICD-10-CM | POA: Diagnosis not present

## 2019-01-01 DIAGNOSIS — Z4789 Encounter for other orthopedic aftercare: Secondary | ICD-10-CM | POA: Diagnosis not present

## 2019-01-01 DIAGNOSIS — S42292S Other displaced fracture of upper end of left humerus, sequela: Secondary | ICD-10-CM | POA: Diagnosis not present

## 2019-01-01 DIAGNOSIS — S42302A Unspecified fracture of shaft of humerus, left arm, initial encounter for closed fracture: Secondary | ICD-10-CM | POA: Diagnosis not present

## 2019-01-01 DIAGNOSIS — Y92017 Garden or yard in single-family (private) house as the place of occurrence of the external cause: Secondary | ICD-10-CM | POA: Diagnosis not present

## 2019-01-01 DIAGNOSIS — S42202A Unspecified fracture of upper end of left humerus, initial encounter for closed fracture: Secondary | ICD-10-CM | POA: Diagnosis present

## 2019-01-01 DIAGNOSIS — E876 Hypokalemia: Secondary | ICD-10-CM | POA: Diagnosis not present

## 2019-01-01 DIAGNOSIS — R2689 Other abnormalities of gait and mobility: Secondary | ICD-10-CM | POA: Diagnosis not present

## 2019-01-01 DIAGNOSIS — Z23 Encounter for immunization: Secondary | ICD-10-CM | POA: Diagnosis not present

## 2019-01-01 DIAGNOSIS — H4089 Other specified glaucoma: Secondary | ICD-10-CM | POA: Diagnosis not present

## 2019-01-01 DIAGNOSIS — Y9301 Activity, walking, marching and hiking: Secondary | ICD-10-CM | POA: Diagnosis present

## 2019-01-01 DIAGNOSIS — E119 Type 2 diabetes mellitus without complications: Secondary | ICD-10-CM | POA: Diagnosis not present

## 2019-01-01 DIAGNOSIS — C50919 Malignant neoplasm of unspecified site of unspecified female breast: Secondary | ICD-10-CM | POA: Diagnosis not present

## 2019-01-01 DIAGNOSIS — R278 Other lack of coordination: Secondary | ICD-10-CM | POA: Diagnosis not present

## 2019-01-01 DIAGNOSIS — R279 Unspecified lack of coordination: Secondary | ICD-10-CM | POA: Diagnosis not present

## 2019-01-01 DIAGNOSIS — S52502A Unspecified fracture of the lower end of left radius, initial encounter for closed fracture: Secondary | ICD-10-CM | POA: Diagnosis not present

## 2019-01-01 DIAGNOSIS — Z743 Need for continuous supervision: Secondary | ICD-10-CM | POA: Diagnosis not present

## 2019-01-01 DIAGNOSIS — W101XXA Fall (on)(from) sidewalk curb, initial encounter: Secondary | ICD-10-CM | POA: Diagnosis present

## 2019-01-01 DIAGNOSIS — D62 Acute posthemorrhagic anemia: Secondary | ICD-10-CM | POA: Diagnosis not present

## 2019-01-01 DIAGNOSIS — R41841 Cognitive communication deficit: Secondary | ICD-10-CM | POA: Diagnosis not present

## 2019-01-01 DIAGNOSIS — G9349 Other encephalopathy: Secondary | ICD-10-CM | POA: Diagnosis not present

## 2019-01-01 DIAGNOSIS — W19XXXA Unspecified fall, initial encounter: Secondary | ICD-10-CM | POA: Diagnosis not present

## 2019-01-01 DIAGNOSIS — R2681 Unsteadiness on feet: Secondary | ICD-10-CM | POA: Diagnosis not present

## 2019-01-01 DIAGNOSIS — E2689 Other hyperaldosteronism: Secondary | ICD-10-CM | POA: Diagnosis not present

## 2019-01-01 DIAGNOSIS — M6281 Muscle weakness (generalized): Secondary | ICD-10-CM | POA: Diagnosis not present

## 2019-01-01 DIAGNOSIS — I959 Hypotension, unspecified: Secondary | ICD-10-CM | POA: Diagnosis not present

## 2019-01-01 DIAGNOSIS — R4189 Other symptoms and signs involving cognitive functions and awareness: Secondary | ICD-10-CM | POA: Diagnosis not present

## 2019-01-01 DIAGNOSIS — I10 Essential (primary) hypertension: Secondary | ICD-10-CM | POA: Diagnosis not present

## 2019-01-01 DIAGNOSIS — Z789 Other specified health status: Secondary | ICD-10-CM | POA: Diagnosis not present

## 2019-01-01 LAB — HEMOGLOBIN A1C
Hgb A1c MFr Bld: 7.2 % — ABNORMAL HIGH (ref 4.8–5.6)
Mean Plasma Glucose: 160 mg/dL

## 2019-01-01 LAB — GLUCOSE, CAPILLARY
Glucose-Capillary: 147 mg/dL — ABNORMAL HIGH (ref 70–99)
Glucose-Capillary: 181 mg/dL — ABNORMAL HIGH (ref 70–99)

## 2019-01-01 LAB — CBC
HCT: 27.4 % — ABNORMAL LOW (ref 36.0–46.0)
Hemoglobin: 8.6 g/dL — ABNORMAL LOW (ref 12.0–15.0)
MCH: 27.8 pg (ref 26.0–34.0)
MCHC: 31.4 g/dL (ref 30.0–36.0)
MCV: 88.7 fL (ref 80.0–100.0)
Platelets: 261 10*3/uL (ref 150–400)
RBC: 3.09 MIL/uL — ABNORMAL LOW (ref 3.87–5.11)
RDW: 12.3 % (ref 11.5–15.5)
WBC: 7.3 10*3/uL (ref 4.0–10.5)
nRBC: 0 % (ref 0.0–0.2)

## 2019-01-01 MED ORDER — ACETAMINOPHEN 500 MG PO TABS: 500.0000 mg | ORAL_TABLET | Freq: Three times a day (TID) | ORAL | 0 refills | Status: AC | PRN

## 2019-01-01 MED ORDER — FERROUS SULFATE 325 (65 FE) MG PO TABS: 325.0000 mg | ORAL_TABLET | Freq: Two times a day (BID) | ORAL | 3 refills | Status: AC

## 2019-01-01 MED ORDER — POLYETHYLENE GLYCOL 3350 17 G PO PACK: 17.0000 g | PACK | Freq: Every day | ORAL | 0 refills | Status: DC

## 2019-01-01 MED ORDER — AMLODIPINE BESYLATE 10 MG PO TABS: 5.0000 mg | ORAL_TABLET | Freq: Every day | ORAL | Status: DC

## 2019-01-01 MED ORDER — DOCUSATE SODIUM 100 MG PO CAPS: 200.0000 mg | ORAL_CAPSULE | Freq: Every day | ORAL | 0 refills | Status: AC

## 2019-01-01 MED ORDER — IRBESARTAN 75 MG PO TABS: 75.0000 mg | ORAL_TABLET | Freq: Every day | ORAL | Status: DC

## 2019-01-01 MED ORDER — FERROUS SULFATE 325 (65 FE) MG PO TABS
325.0000 mg | ORAL_TABLET | Freq: Two times a day (BID) | ORAL | Status: DC
  Administered 2019-01-01: 325 mg via ORAL
  Filled 2019-01-01: qty 1

## 2019-01-01 NOTE — Progress Notes (Signed)
Attempted to call [place] to give pt.report but nobody  answered.Pt is about to be transferred to [facility] room 8 awaiting transport.Daughter made aware of this situation.Marland Kitchen

## 2019-01-01 NOTE — TOC Transition Note (Signed)
Transition of Care [facility]) - CM/SW Discharge Note   Patient Details  Name: Vickie Young MRN: [id number] Date of Birth: Jun 19, 1923  Transition of Care [facility]) CM/SW Contact:  Midge Minium RN, BSN, NCM-BC, ACM-RN [phone] Phone Number: 01/01/2019, 11:22 AM   Clinical Narrative:    Patient medically stable to transition to [facility] today. PTAR arranged for 1300. CM spoke to patients daughter Marlon Pel [facility]) to inform of discharge. No further needs from CM.    Final next level of care: Skilled Nursing Facility Barriers to Discharge: No Barriers Identified   Patient Goals and CMS Choice Patient states their goals for this hospitalization and ongoing recovery are:: fix my arm CMS Medicare.gov Compare Post Acute Care list provided to:: Patient Represenative (must comment)(Cheryl Hairston (POA) [phone]) Choice offered to / list presented to : [facility] POA / Guardian(Cheryl Hairston ([place]) [phone])  Discharge Placement PASRR number recieved: 12/31/18            Patient chooses bed at: [facility] Patient to be transferred to facility by: PTAR arranged for 1300 today Name of family member notified: Marlon Pel [facility]) Patient and family notified of of transfer: 01/01/19  Discharge Plan and Services   Discharge Planning Services: CM Consult Post Acute Care Choice: [place]          DME Arranged: N/A DME Agency: NA       HH Arranged: NA HH Agency: NA Date HH Agency Contacted: 12/28/18 Time HH Agency Contacted: 0900 Representative spoke with at [place]: Glyn Ade  Social Determinants of Health (SDOH) Interventions     Readmission Risk Interventions No flowsheet data found.

## 2019-01-01 NOTE — Discharge Instructions (Signed)
Follow with Primary MD [name], MD in 7 days   Get CBC, CMP   checked next visit within 1 week by Primary MD or SNF MD   Activity: L Arm in Sling at all times except hygiene. Full fall precautions use walker/cane & assistance as needed  Disposition SNF  Diet: Heart Healthy Low Carb  Accuchecks 4 times/day, Once in AM empty stomach and then before each meal. Log in all results and show them to your Prim.MD in 3 days. If any glucose reading is under 80 or above 300 call your Prim MD immidiately. Follow Low glucose instructions for glucose under 80 as instructed.   Special Instructions: If you have smoked or chewed Tobacco  in the last 2 yrs please stop smoking, stop any regular Alcohol  and or any Recreational drug use.  On your next visit with your primary care physician please Get Medicines reviewed and adjusted.  Please request your Prim.MD to go over all Hospital Tests and Procedure/Radiological results at the follow up, please get all Hospital records sent to your Prim MD by signing hospital release before you go home.  If you experience worsening of your admission symptoms, develop shortness of breath, life threatening emergency, suicidal or homicidal thoughts you must seek medical attention immediately by calling 911 or calling your MD immediately  if symptoms less severe.  You Must read complete instructions/literature along with all the possible adverse reactions/side effects for all the Medicines you take and that have been prescribed to you. Take any new Medicines after you have completely understood and accpet all the possible adverse reactions/side effects.     Shower:  May shower but keep the wounds dry, use an occlusive plastic wrap, NO SOAKING IN TUB.  If the bandage gets wet, change with a clean dry gauze.  If you have a splint on, leave the splint in place and keep the splint dry with a plastic bag.  Dressing:  You may change your dressing 3-5 days after surgery,  unless you have a splint.  If you have a splint, then just leave the splint in place and we will change your bandages during your first follow-up appointment.    If you had hand or foot surgery, we will plan to remove your stitches in about 2 weeks in the office.  For all other surgeries, there are sticky tapes (steri-strips) on your wounds and all the stitches are absorbable.  Leave the steri-strips in place when changing your dressings, they will peel off with time, usually 2-3 weeks.  Activity:  Increase activity slowly as tolerated, but follow the weight bearing instructions below.  The rules on driving is that you can not be taking narcotics while you drive, and you must feel in control of the vehicle.    Weight Bearing: L Arm in Sling at all times except hygiene.    To prevent constipation: you may use a stool softener such as -  Colace (over the counter) 100 mg by mouth twice a day  Drink plenty of fluids (prune juice may be helpful) and high fiber foods Miralax (over the counter) for constipation as needed.    Itching:  If you experience itching with your medications, try taking only a single pain pill, or even half a pain pill at a time.  You may take up to 10 pain pills per day, and you can also use benadryl over the counter for itching or also to help with sleep.   Precautions:  If  you experience chest pain or shortness of breath - call 911 immediately for transfer to the hospital emergency department!!  If you develop a fever greater that 101 F, purulent drainage from wound, increased redness or drainage from wound, or calf pain -- Call the office at [phone]                                                Follow- Up Appointment:  Please call for an appointment to be seen in 2 weeks [place] - [phone]

## 2019-01-01 NOTE — Progress Notes (Signed)
Pt discharged with PTAR to [facility]

## 2019-01-01 NOTE — Discharge Summary (Signed)
Vickie Young [id number] DOB: 08-26-1923 DOA: 12/28/2018  PCP: [name], MD  Admit date: 12/28/2018  Discharge date: 01/01/2019  Admitted From: Home   Disposition:  SNF   Recommendations for Outpatient Follow-up:   Follow up with PCP in 1-2 weeks  PCP Please obtain BMP/CBC, 2 view CXR in 1week,  (see Discharge instructions)   PCP Please follow up on the following pending results:    Home Health: None   Equipment/Devices: None  Consultations: Ortho Discharge Condition: Stable    CODE STATUS: Full    Diet Recommendation: Heart Healthy Low Carb    Chief Complaint  Patient presents with   Arm Injury     Brief history of present illness from the day of admission and additional interim summary    Vickie Young an 85 y.o.femalewith medical history significant ofDM presenting with a mechanical fall.She apparently went outside at some point overnight and fell off a curb. Her daughter found her on the ground about 0430 this AM. Her daughter thinks she went out to check on the garbage cans since it is garbage day. Her daughter thinks it would have been last night before bed since it would be unlike her to wander outside in the middle of the night. Denies h/o dementia.S/p OR on 9/11.                                                                 Hospital Course    1.  Fall causing left-sided acute proximal humerus fracture with some displacement along with distal radius fracture - she is status post ORIF on 12/29/2018.  Seen by orthopedic surgeon Dr. Mardelle Matte, nonweightbearing left arm, left arm to remain in splint and sling throughout.  Will require SNF and outpatient follow-up with Dr. Mardelle Matte. DC today.  2.  Mild perioperative blood loss related anemia.  No acute issues, PO Iron x 1  month, follow CBC and Anemia panel at SNF in 7-10 day.  3.  Mild delirium, nonspecific encephalopathy.  Likely due to being in the hospital.  Stable with Supportive care.  Minimize narcotics and benzodiazepines.  4.  Glaucoma.  Continue eyedrops.  5.  Hypertension.  placed on Norvasc + ARB.Marland Kitchen  6.  Hypokalemia.  Replaced and stable.  Stopped HCTZ.  7. DM type II.  resumed home Rx.   Discharge diagnosis     Principal Problem:   Closed fracture of left proximal humerus Active Problems:   Glaucoma   Diabetes mellitus without complication (HCC)   Closed fracture of left distal radius   Essential hypertension   Humerus fracture    Discharge instructions    Discharge Instructions    Discharge instructions   Complete by: As directed    Follow with Primary MD [name], MD in 7 days  Get CBC, CMP   checked next visit within 1 week by Primary MD or SNF MD   Activity: L Arm in Sling at all times except hygiene. Full fall precautions use walker/cane & assistance as needed  Disposition SNF  Diet: Heart Healthy Low Carb  Accuchecks 4 times/day, Once in AM empty stomach and then before each meal. Log in all results and show them to your Prim.MD in 3 days. If any glucose reading is under 80 or above 300 call your Prim MD immidiately. Follow Low glucose instructions for glucose under 80 as instructed.   Special Instructions: If you have smoked or chewed Tobacco  in the last 2 yrs please stop smoking, stop any regular Alcohol  and or any Recreational drug use.  On your next visit with your primary care physician please Get Medicines reviewed and adjusted.  Please request your Prim.MD to go over all Hospital Tests and Procedure/Radiological results at the follow up, please get all Hospital records sent to your Prim MD by signing hospital release before you go home.  If you experience worsening of your admission symptoms, develop shortness of breath, life threatening  emergency, suicidal or homicidal thoughts you must seek medical attention immediately by calling 911 or calling your MD immediately  if symptoms less severe.  You Must read complete instructions/literature along with all the possible adverse reactions/side effects for all the Medicines you take and that have been prescribed to you. Take any new Medicines after you have completely understood and accpet all the possible adverse reactions/side effects.   Increase activity slowly   Complete by: As directed       Discharge Medications   Allergies as of 01/01/2019   No Known Allergies     Medication List    STOP taking these medications   telmisartan-hydrochlorothiazide 80-12.5 MG tablet Commonly known as: MICARDIS HCT     TAKE these medications   acetaminophen 500 MG tablet Commonly known as: TYLENOL Take 1 tablet (500 mg total) by mouth every 8 (eight) hours as needed for moderate pain.   amLODipine 10 MG tablet Commonly known as: NORVASC Take 0.5 tablets (5 mg total) by mouth daily. What changed: medication strength   aspirin EC 81 MG EC tablet Generic drug: aspirin Take 81 mg by mouth daily. Swallow whole.   Combigan 0.2-0.5 % ophthalmic solution Generic drug: brimonidine-timolol Place 1 drop into both eyes at bedtime.   docusate sodium 100 MG capsule Commonly known as: Colace Take 2 capsules (200 mg total) by mouth daily.   ferrous sulfate 325 (65 FE) MG tablet Take 1 tablet (325 mg total) by mouth 2 (two) times daily with a meal.   irbesartan 75 MG tablet Commonly known as: AVAPRO Take 1 tablet (75 mg total) by mouth daily. Start taking on: January 02, 2019   metFORMIN 500 MG tablet Commonly known as: GLUCOPHAGE Take 500 mg by mouth daily.   multivitamin capsule Take 1 capsule by mouth daily.   polyethylene glycol 17 g packet Commonly known as: MiraLax Take 17 g by mouth daily.   Tradjenta 5 MG Tabs tablet Generic drug: linagliptin Take 5 mg by mouth  daily.   Travoprost (BAK Free) 0.004 % Soln ophthalmic solution Commonly known as: TRAVATAN Place 1 drop into both eyes at bedtime.       Follow-up Information    Marchia Bond, MD. Schedule an appointment as soon as possible for a visit in 2 weeks.   Specialty: Orthopedic Surgery Contact information: [id number]  [place] [address] [phone]        [name], MD. Schedule an appointment as soon as possible for a visit in 1 week(s).   Specialty: Internal Medicine Contact information: [address] [place] [address] [phone]           Major procedures and Radiology Reports - PLEASE review detailed and final reports thoroughly  -         Dg Wrist 2 Views Left  Result Date: 12/29/2018 CLINICAL DATA:  Postoperative pain EXAM: LEFT WRIST - 2 VIEW COMPARISON:  Yesterday FINDINGS: ORIF of recent distal radius fracture with anatomic alignment and unremarkable volar plate. Ulnar styloid fracture with no significant displacement. Located wrist joint. IMPRESSION: Distal radius fracture reduction and fixation with no complicating feature. I Improved alignment of the ulnar styloid fracture. Electronically Signed   By: Monte Fantasia M.D.   On: 12/29/2018 14:23   Dg Wrist 2 Views Left  Result Date: 12/28/2018 CLINICAL DATA:  Fall. EXAM: LEFT WRIST - 1 VIEW COMPARISON:  Earlier the same day FINDINGS: A single lateral view (at clinician request) shows distal radius fracture with impaction and posterior displacement that is increased. No visible ulna fracture or wrist dislocation. IMPRESSION: Single lateral view shows worsening posterior displacement and impaction of the comminuted distal radius. Electronically Signed   By: Monte Fantasia M.D.   On: 12/28/2018 07:57   Dg Wrist 2 Views Left  Result Date: 12/28/2018 CLINICAL DATA:  Arm injury.  Pain EXAM: LEFT WRIST - 2 VIEW COMPARISON:  None. FINDINGS: Severely comminuted fracture  distal radius. Fracture extends into the radiocarpal joint with multiple displaced fragments. Foreshortening of the distal radius. Possible tiny avulsion fracture of the ulnar styloid. No fracture in the carpal bones. IMPRESSION: Severely comminuted fracture distal radius. Electronically Signed   By: Franchot Gallo M.D.   On: 12/28/2018 07:11   Ct Head Wo Contrast  Result Date: 12/28/2018 CLINICAL DATA:  Fall with head injury EXAM: CT HEAD WITHOUT CONTRAST CT CERVICAL SPINE WITHOUT CONTRAST TECHNIQUE: Multidetector CT imaging of the head and cervical spine was performed following the standard protocol without intravenous contrast. Multiplanar CT image reconstructions of the cervical spine were also generated. COMPARISON:  Head CT 01/25/2010 FINDINGS: CT HEAD FINDINGS Brain: No evidence of acute infarction, hemorrhage, hydrocephalus, extra-axial collection or mass lesion/mass effect. Atrophy and chronic small vessel ischemia, the latter moderate to extensive. Vascular: No hyperdense vessel or unexpected calcification. Skull: Negative for acute fracture Sinuses/Orbits: No evidence of injury.  Bilateral cataract resection CT CERVICAL SPINE FINDINGS Alignment: Normal Skull base and vertebrae: No acute fracture. No primary bone lesion or focal pathologic process. Soft tissues and spinal canal: No prevertebral fluid or swelling. No visible canal hematoma. Disc levels: Generalized degenerative disc narrowing and endplate ridging. Upper chest: Biapical scarring. Other: Left humerus fracture on scout radiograph, known from radiography. IMPRESSION: No evidence of acute intracranial or cervical spine injury. Electronically Signed   By: Monte Fantasia M.D.   On: 12/28/2018 07:53   Ct Cervical Spine Wo Contrast  Result Date: 12/28/2018 CLINICAL DATA:  Fall with head injury EXAM: CT HEAD WITHOUT CONTRAST CT CERVICAL SPINE WITHOUT CONTRAST TECHNIQUE: Multidetector CT imaging of the head and cervical spine was performed  following the standard protocol without intravenous contrast. Multiplanar CT image reconstructions of the cervical spine were also generated. COMPARISON:  Head CT 01/25/2010 FINDINGS: CT HEAD FINDINGS Brain: No evidence of acute infarction, hemorrhage, hydrocephalus, extra-axial collection or  mass lesion/mass effect. Atrophy and chronic small vessel ischemia, the latter moderate to extensive. Vascular: No hyperdense vessel or unexpected calcification. Skull: Negative for acute fracture Sinuses/Orbits: No evidence of injury.  Bilateral cataract resection CT CERVICAL SPINE FINDINGS Alignment: Normal Skull base and vertebrae: No acute fracture. No primary bone lesion or focal pathologic process. Soft tissues and spinal canal: No prevertebral fluid or swelling. No visible canal hematoma. Disc levels: Generalized degenerative disc narrowing and endplate ridging. Upper chest: Biapical scarring. Other: Left humerus fracture on scout radiograph, known from radiography. IMPRESSION: No evidence of acute intracranial or cervical spine injury. Electronically Signed   By: Monte Fantasia M.D.   On: 12/28/2018 07:53   Ct Shoulder Left Wo Contrast  Result Date: 12/28/2018 CLINICAL DATA:  Fall. EXAM: CT OF THE UPPER LEFT EXTREMITY WITHOUT CONTRAST TECHNIQUE: Multidetector CT imaging of the upper left extremity was performed according to the standard protocol. COMPARISON:  Left shoulder x-rays from same day. FINDINGS: Bones/Joint/Cartilage Acute transverse fracture of the proximal humeral diaphysis with greater than one shaft with anterior displacement. The humeral head is abducted. Minimal degenerative changes of the acromioclavicular joint. The glenohumeral joint space is preserved. Osteopenia. No joint effusion. Ligaments Suboptimally assessed by CT. Muscles and Tendons Grossly intact.  No muscle atrophy. Soft tissues Soft tissue swelling and hemorrhage in the axilla. No soft tissue mass or fluid collection. IMPRESSION: 1.  Acute displaced transverse fracture of the proximal humeral diaphysis. Electronically Signed   By: Titus Dubin M.D.   On: 12/28/2018 11:48   Dg Chest Port 1 View  Result Date: 12/28/2018 CLINICAL DATA:  Trauma with arm pain EXAM: PORTABLE CHEST 1 VIEW COMPARISON:  None. FINDINGS: Significantly displaced and over riding proximal left humerus fracture. Normal heart size. Mild aortic tortuosity. There is no edema, consolidation, effusion, or pneumothorax. Postoperative thoracic inlet, likely thyroidectomy. Osteopenia IMPRESSION: 1. Displaced left proximal humerus fracture. 2. No evidence of active cardiopulmonary disease. Electronically Signed   By: Monte Fantasia M.D.   On: 12/28/2018 07:11   Dg Shoulder Left Port  Result Date: 12/29/2018 CLINICAL DATA:  Postoperative pain. EXAM: LEFT SHOULDER - 1 VIEW COMPARISON:  Shoulder radiograph 12/28/2018 FINDINGS: Plate screw fixation of the proximal left humerus. Fracture fragments appear improved anatomic alignment. Left AC joint degenerative changes. Left hemithorax unremarkable. IMPRESSION: Improved anatomic alignment of the proximal left humerus status post ORIF with plate and screw fixation. Electronically Signed   By: Lovey Newcomer M.D.   On: 12/29/2018 14:20   Dg Shoulder Left Port  Result Date: 12/28/2018 CLINICAL DATA:  Arm injury, pain EXAM: LEFT SHOULDER - 1 VIEW COMPARISON:  None. FINDINGS: Transverse fracture of the proximal humerus below the humeral neck. Greater than 1 shaft width of medial displacement. No other fracture. IMPRESSION: Medially displaced fracture proximal left humerus. Electronically Signed   By: Franchot Gallo M.D.   On: 12/28/2018 07:09   Dg Humerus Left  Result Date: 12/28/2018 CLINICAL DATA:  ORIF humerus fracture EXAM: LEFT HUMERUS - 2+ VIEW; DG C-ARM 1-60 MIN COMPARISON:  12/28/2018 FINDINGS: Fracture proximal humerus has been reduced and fixed with metal plate and screws. Anatomic fracture alignment. IMPRESSION:  Anatomic alignment following ORIF of proximal humeral fracture on the left. Electronically Signed   By: Franchot Gallo M.D.   On: 12/28/2018 15:39   Dg C-arm 1-60 Min  Result Date: 12/28/2018 CLINICAL DATA:  ORIF humerus fracture EXAM: LEFT HUMERUS - 2+ VIEW; DG C-ARM 1-60 MIN COMPARISON:  12/28/2018 FINDINGS: Fracture proximal humerus has  been reduced and fixed with metal plate and screws. Anatomic fracture alignment. IMPRESSION: Anatomic alignment following ORIF of proximal humeral fracture on the left. Electronically Signed   By: Franchot Gallo M.D.   On: 12/28/2018 15:39    Micro Results     Recent Results (from the past 240 hour(s))  SARS Coronavirus 2 [facility] order, Performed in [facility] hospital lab) Nasopharyngeal Nasopharyngeal Swab     Status: None   Collection Time: 12/28/18  6:32 AM   Specimen: Nasopharyngeal Swab  Result Value Ref Range Status   SARS Coronavirus 2 NEGATIVE NEGATIVE Final    Comment: (NOTE) If result is NEGATIVE SARS-CoV-2 target nucleic acids are NOT DETECTED. The SARS-CoV-2 RNA is generally detectable in upper and lower  respiratory specimens during the acute phase of infection. The lowest  concentration of SARS-CoV-2 viral copies this assay can detect is 250  copies / mL. A negative result does not preclude SARS-CoV-2 infection  and should not be used as the sole basis for treatment or other  patient management decisions.  A negative result may occur with  improper specimen collection / handling, submission of specimen other  than nasopharyngeal swab, presence of viral mutation(s) within the  areas targeted by this assay, and inadequate number of viral copies  (<250 copies / mL). A negative result must be combined with clinical  observations, patient history, and epidemiological information. If result is POSITIVE SARS-CoV-2 target nucleic acids are DETECTED. The SARS-CoV-2 RNA is generally detectable in upper and lower  respiratory specimens  dur ing the acute phase of infection.  Positive  results are indicative of active infection with SARS-CoV-2.  Clinical  correlation with patient history and other diagnostic information is  necessary to determine patient infection status.  Positive results do  not rule out bacterial infection or co-infection with other viruses. If result is PRESUMPTIVE POSTIVE SARS-CoV-2 nucleic acids MAY BE PRESENT.   A presumptive positive result was obtained on the submitted specimen  and confirmed on repeat testing.  While 2019 novel coronavirus  (SARS-CoV-2) nucleic acids may be present in the submitted sample  additional confirmatory testing may be necessary for epidemiological  and / or clinical management purposes  to differentiate between  SARS-CoV-2 and other Sarbecovirus currently known to infect humans.  If clinically indicated additional testing with an alternate test  methodology [phone]) is advised. The SARS-CoV-2 RNA is generally  detectable in upper and lower respiratory sp ecimens during the acute  phase of infection. The expected result is Negative. Fact Sheet for Patients:  StrictlyIdeas.no Fact Sheet for Healthcare Providers: BankingDealers.co.za This test is not yet approved or cleared by the [place] FDA and has been authorized for detection and/or diagnosis of SARS-CoV-2 by FDA under an Emergency Use Authorization (EUA).  This EUA will remain in effect (meaning this test can be used) for the duration of the COVID-19 declaration under Section 564(b)(1) of the Act, 21 U.S.C. section 360bbb-3(b)(1), unless the authorization is terminated or revoked sooner. Performed at [facility], [place] [address], [place], [place] [address]     Today   Subjective    Dannie Ratterree today has no headache,no chest abdominal pain,no new weakness tingling or numbness, feels much better     Objective   Blood pressure 119/64, pulse  66, temperature 98.6 F (37 C), temperature source Oral, resp. rate 18, height 5\' 10"  (1.778 m), weight 87.3 kg, SpO2 99 %.   Intake/Output Summary (Last 24 hours) at 01/01/2019 0908 Last  data filed at 01/01/2019 0600 Gross per 24 hour  Intake 480 ml  Output 750 ml  Net -270 ml    Exam Awake Alert,   No new F.N deficits,  L arm in sling. [place].AT,PERRAL Supple Neck,No JVD, No cervical lymphadenopathy appriciated.  Symmetrical Chest wall movement, Good air movement bilaterally, CTAB RRR,No Gallops,Rubs or new Murmurs, No Parasternal Heave +ve B.Sounds, Abd Soft, Non tender, No organomegaly appriciated, No rebound -guarding or rigidity. No Cyanosis, Clubbing or edema, No new Rash or bruise   Data Review   CBC w Diff:  Lab Results  Component Value Date   WBC 7.3 01/01/2019   HGB 8.6 (L) 01/01/2019   HCT 27.4 (L) 01/01/2019   PLT 261 01/01/2019   LYMPHOPCT 8 12/28/2018   MONOPCT 4 12/28/2018   EOSPCT 1 12/28/2018   BASOPCT 0 12/28/2018    CMP:  Lab Results  Component Value Date   NA 137 12/31/2018   K 4.2 12/31/2018   CL 106 12/31/2018   CO2 24 12/31/2018   BUN 15 12/31/2018   CREATININE 0.97 12/31/2018   PROT 6.2 08/11/2007   ALBUMIN 2.9 (L) 08/11/2007   BILITOT 2.0 (H) 08/11/2007   ALKPHOS 68 08/11/2007   AST 133 (H) 08/11/2007   ALT 131 (H) 08/11/2007  .   Total Time in preparing paper work, data evaluation and todays exam - 82 minutes  Lala Lund M.D on 01/01/2019 at 9:08 AM  [facility]   Office  [phone]

## 2019-01-03 DIAGNOSIS — E119 Type 2 diabetes mellitus without complications: Secondary | ICD-10-CM | POA: Diagnosis not present

## 2019-01-03 DIAGNOSIS — E2689 Other hyperaldosteronism: Secondary | ICD-10-CM | POA: Diagnosis not present

## 2019-01-03 DIAGNOSIS — R4189 Other symptoms and signs involving cognitive functions and awareness: Secondary | ICD-10-CM | POA: Diagnosis not present

## 2019-01-03 DIAGNOSIS — I10 Essential (primary) hypertension: Secondary | ICD-10-CM | POA: Diagnosis not present

## 2019-01-03 DIAGNOSIS — H4089 Other specified glaucoma: Secondary | ICD-10-CM | POA: Diagnosis not present

## 2019-01-03 DIAGNOSIS — S42302A Unspecified fracture of shaft of humerus, left arm, initial encounter for closed fracture: Secondary | ICD-10-CM | POA: Diagnosis not present

## 2019-01-03 DIAGNOSIS — C50919 Malignant neoplasm of unspecified site of unspecified female breast: Secondary | ICD-10-CM | POA: Diagnosis not present

## 2019-01-03 DIAGNOSIS — G9349 Other encephalopathy: Secondary | ICD-10-CM | POA: Diagnosis not present

## 2019-01-03 DIAGNOSIS — D62 Acute posthemorrhagic anemia: Secondary | ICD-10-CM | POA: Diagnosis not present

## 2019-01-03 DIAGNOSIS — Z789 Other specified health status: Secondary | ICD-10-CM | POA: Diagnosis not present

## 2019-01-10 ENCOUNTER — Other Ambulatory Visit: Payer: Self-pay | Admitting: *Deleted

## 2019-01-10 NOTE — Patient Outreach (Signed)
Member assessed for potential [facility] Care Management needs as a benefit of [place] [employer].  Member recently discharged from [facility] on 01/08/19 AMA per [facility] documentation platform.  Telephone call made to Morene Rankins (daughter/HCPOA) at [phone]. Patient identifiers confirmed. Attempted to discuss [facility] follow up. However, Hoyle Sauer abruptly said " we are going to see the doctor who did her surgery and she is in good hands." She proceeded to hang up the phone.  Writer to sign off as [facility] Care Management services were abruptly declined.   Marthenia Rolling, MSN-Ed, RN,BSN [facility] Acute Care Coordinator [phone] [facility]) [phone]  (Toll free office)

## 2019-01-11 DIAGNOSIS — S42292D Other displaced fracture of upper end of left humerus, subsequent encounter for fracture with routine healing: Secondary | ICD-10-CM | POA: Diagnosis not present

## 2019-01-11 DIAGNOSIS — S52502D Unspecified fracture of the lower end of left radius, subsequent encounter for closed fracture with routine healing: Secondary | ICD-10-CM | POA: Diagnosis not present

## 2019-02-06 DIAGNOSIS — S52502D Unspecified fracture of the lower end of left radius, subsequent encounter for closed fracture with routine healing: Secondary | ICD-10-CM | POA: Diagnosis not present

## 2019-02-06 DIAGNOSIS — S42292D Other displaced fracture of upper end of left humerus, subsequent encounter for fracture with routine healing: Secondary | ICD-10-CM | POA: Diagnosis not present

## 2019-02-13 DIAGNOSIS — S42292D Other displaced fracture of upper end of left humerus, subsequent encounter for fracture with routine healing: Secondary | ICD-10-CM | POA: Diagnosis not present

## 2019-02-13 DIAGNOSIS — M818 Other osteoporosis without current pathological fracture: Secondary | ICD-10-CM | POA: Diagnosis not present

## 2019-02-13 DIAGNOSIS — S52502D Unspecified fracture of the lower end of left radius, subsequent encounter for closed fracture with routine healing: Secondary | ICD-10-CM | POA: Diagnosis not present

## 2019-02-19 DIAGNOSIS — S52502D Unspecified fracture of the lower end of left radius, subsequent encounter for closed fracture with routine healing: Secondary | ICD-10-CM | POA: Diagnosis not present

## 2019-02-19 DIAGNOSIS — S42292D Other displaced fracture of upper end of left humerus, subsequent encounter for fracture with routine healing: Secondary | ICD-10-CM | POA: Diagnosis not present

## 2019-02-19 DIAGNOSIS — M818 Other osteoporosis without current pathological fracture: Secondary | ICD-10-CM | POA: Diagnosis not present

## 2019-02-21 DIAGNOSIS — S52502D Unspecified fracture of the lower end of left radius, subsequent encounter for closed fracture with routine healing: Secondary | ICD-10-CM | POA: Diagnosis not present

## 2019-02-21 DIAGNOSIS — S42292D Other displaced fracture of upper end of left humerus, subsequent encounter for fracture with routine healing: Secondary | ICD-10-CM | POA: Diagnosis not present

## 2019-02-21 DIAGNOSIS — M818 Other osteoporosis without current pathological fracture: Secondary | ICD-10-CM | POA: Diagnosis not present

## 2019-02-26 DIAGNOSIS — S52502D Unspecified fracture of the lower end of left radius, subsequent encounter for closed fracture with routine healing: Secondary | ICD-10-CM | POA: Diagnosis not present

## 2019-02-26 DIAGNOSIS — S42292D Other displaced fracture of upper end of left humerus, subsequent encounter for fracture with routine healing: Secondary | ICD-10-CM | POA: Diagnosis not present

## 2019-02-26 DIAGNOSIS — M818 Other osteoporosis without current pathological fracture: Secondary | ICD-10-CM | POA: Diagnosis not present

## 2019-02-28 DIAGNOSIS — S52502D Unspecified fracture of the lower end of left radius, subsequent encounter for closed fracture with routine healing: Secondary | ICD-10-CM | POA: Diagnosis not present

## 2019-02-28 DIAGNOSIS — S42292D Other displaced fracture of upper end of left humerus, subsequent encounter for fracture with routine healing: Secondary | ICD-10-CM | POA: Diagnosis not present

## 2019-02-28 DIAGNOSIS — M818 Other osteoporosis without current pathological fracture: Secondary | ICD-10-CM | POA: Diagnosis not present

## 2019-03-05 DIAGNOSIS — S42292D Other displaced fracture of upper end of left humerus, subsequent encounter for fracture with routine healing: Secondary | ICD-10-CM | POA: Diagnosis not present

## 2019-03-05 DIAGNOSIS — M818 Other osteoporosis without current pathological fracture: Secondary | ICD-10-CM | POA: Diagnosis not present

## 2019-03-05 DIAGNOSIS — S52502D Unspecified fracture of the lower end of left radius, subsequent encounter for closed fracture with routine healing: Secondary | ICD-10-CM | POA: Diagnosis not present

## 2019-03-06 DIAGNOSIS — S42292D Other displaced fracture of upper end of left humerus, subsequent encounter for fracture with routine healing: Secondary | ICD-10-CM | POA: Diagnosis not present

## 2019-03-06 DIAGNOSIS — S52502D Unspecified fracture of the lower end of left radius, subsequent encounter for closed fracture with routine healing: Secondary | ICD-10-CM | POA: Diagnosis not present

## 2019-03-07 DIAGNOSIS — M818 Other osteoporosis without current pathological fracture: Secondary | ICD-10-CM | POA: Diagnosis not present

## 2019-03-07 DIAGNOSIS — S52502D Unspecified fracture of the lower end of left radius, subsequent encounter for closed fracture with routine healing: Secondary | ICD-10-CM | POA: Diagnosis not present

## 2019-03-07 DIAGNOSIS — S42292D Other displaced fracture of upper end of left humerus, subsequent encounter for fracture with routine healing: Secondary | ICD-10-CM | POA: Diagnosis not present

## 2019-03-12 DIAGNOSIS — S52502D Unspecified fracture of the lower end of left radius, subsequent encounter for closed fracture with routine healing: Secondary | ICD-10-CM | POA: Diagnosis not present

## 2019-03-12 DIAGNOSIS — S42292D Other displaced fracture of upper end of left humerus, subsequent encounter for fracture with routine healing: Secondary | ICD-10-CM | POA: Diagnosis not present

## 2019-03-12 DIAGNOSIS — M818 Other osteoporosis without current pathological fracture: Secondary | ICD-10-CM | POA: Diagnosis not present

## 2019-03-13 ENCOUNTER — Other Ambulatory Visit: Payer: Self-pay

## 2019-03-19 DIAGNOSIS — S42292D Other displaced fracture of upper end of left humerus, subsequent encounter for fracture with routine healing: Secondary | ICD-10-CM | POA: Diagnosis not present

## 2019-03-19 DIAGNOSIS — S52502D Unspecified fracture of the lower end of left radius, subsequent encounter for closed fracture with routine healing: Secondary | ICD-10-CM | POA: Diagnosis not present

## 2019-03-19 DIAGNOSIS — M818 Other osteoporosis without current pathological fracture: Secondary | ICD-10-CM | POA: Diagnosis not present

## 2019-03-21 DIAGNOSIS — M818 Other osteoporosis without current pathological fracture: Secondary | ICD-10-CM | POA: Diagnosis not present

## 2019-03-21 DIAGNOSIS — S52502D Unspecified fracture of the lower end of left radius, subsequent encounter for closed fracture with routine healing: Secondary | ICD-10-CM | POA: Diagnosis not present

## 2019-03-21 DIAGNOSIS — S42292D Other displaced fracture of upper end of left humerus, subsequent encounter for fracture with routine healing: Secondary | ICD-10-CM | POA: Diagnosis not present

## 2019-04-22 DIAGNOSIS — F0390 Unspecified dementia without behavioral disturbance: Secondary | ICD-10-CM | POA: Diagnosis not present

## 2019-04-22 DIAGNOSIS — Z0000 Encounter for general adult medical examination without abnormal findings: Secondary | ICD-10-CM | POA: Diagnosis not present

## 2019-04-22 DIAGNOSIS — R600 Localized edema: Secondary | ICD-10-CM | POA: Diagnosis not present

## 2019-04-22 DIAGNOSIS — E7800 Pure hypercholesterolemia, unspecified: Secondary | ICD-10-CM | POA: Diagnosis not present

## 2019-04-22 DIAGNOSIS — E1121 Type 2 diabetes mellitus with diabetic nephropathy: Secondary | ICD-10-CM | POA: Diagnosis not present

## 2019-04-22 DIAGNOSIS — L299 Pruritus, unspecified: Secondary | ICD-10-CM | POA: Diagnosis not present

## 2019-04-22 DIAGNOSIS — I10 Essential (primary) hypertension: Secondary | ICD-10-CM | POA: Diagnosis not present

## 2019-04-22 DIAGNOSIS — D649 Anemia, unspecified: Secondary | ICD-10-CM | POA: Diagnosis not present

## 2019-04-23 ENCOUNTER — Other Ambulatory Visit: Payer: Self-pay | Admitting: Internal Medicine

## 2019-04-23 DIAGNOSIS — F0390 Unspecified dementia without behavioral disturbance: Secondary | ICD-10-CM

## 2019-04-26 ENCOUNTER — Ambulatory Visit
Admission: RE | Admit: 2019-04-26 | Discharge: 2019-04-26 | Disposition: A | Discharge status: Home / Self Care | Payer: Medicare Other | Source: Ambulatory Visit | Attending: Internal Medicine | Admitting: Internal Medicine

## 2019-04-26 DIAGNOSIS — F0390 Unspecified dementia without behavioral disturbance: Secondary | ICD-10-CM | POA: Diagnosis not present

## 2019-04-30 DIAGNOSIS — H35373 Puckering of macula, bilateral: Secondary | ICD-10-CM | POA: Diagnosis not present

## 2019-04-30 DIAGNOSIS — H401111 Primary open-angle glaucoma, right eye, mild stage: Secondary | ICD-10-CM | POA: Diagnosis not present

## 2019-04-30 DIAGNOSIS — H35033 Hypertensive retinopathy, bilateral: Secondary | ICD-10-CM | POA: Diagnosis not present

## 2019-04-30 DIAGNOSIS — H401123 Primary open-angle glaucoma, left eye, severe stage: Secondary | ICD-10-CM | POA: Diagnosis not present

## 2019-04-30 DIAGNOSIS — E113391 Type 2 diabetes mellitus with moderate nonproliferative diabetic retinopathy without macular edema, right eye: Secondary | ICD-10-CM | POA: Diagnosis not present

## 2019-05-15 DIAGNOSIS — S42292D Other displaced fracture of upper end of left humerus, subsequent encounter for fracture with routine healing: Secondary | ICD-10-CM | POA: Diagnosis not present

## 2019-05-15 DIAGNOSIS — S52502D Unspecified fracture of the lower end of left radius, subsequent encounter for closed fracture with routine healing: Secondary | ICD-10-CM | POA: Diagnosis not present

## 2019-08-08 DIAGNOSIS — E1122 Type 2 diabetes mellitus with diabetic chronic kidney disease: Secondary | ICD-10-CM | POA: Diagnosis not present

## 2019-08-08 DIAGNOSIS — R634 Abnormal weight loss: Secondary | ICD-10-CM | POA: Diagnosis not present

## 2019-08-08 DIAGNOSIS — D649 Anemia, unspecified: Secondary | ICD-10-CM | POA: Diagnosis not present

## 2019-08-08 DIAGNOSIS — E1121 Type 2 diabetes mellitus with diabetic nephropathy: Secondary | ICD-10-CM | POA: Diagnosis not present

## 2019-08-22 DIAGNOSIS — E1121 Type 2 diabetes mellitus with diabetic nephropathy: Secondary | ICD-10-CM | POA: Diagnosis not present

## 2019-08-26 DIAGNOSIS — K921 Melena: Secondary | ICD-10-CM | POA: Diagnosis not present

## 2019-09-05 DIAGNOSIS — I10 Essential (primary) hypertension: Secondary | ICD-10-CM | POA: Diagnosis not present

## 2019-09-05 DIAGNOSIS — E1121 Type 2 diabetes mellitus with diabetic nephropathy: Secondary | ICD-10-CM | POA: Diagnosis not present

## 2019-09-17 DIAGNOSIS — I219 Acute myocardial infarction, unspecified: Secondary | ICD-10-CM

## 2019-09-17 HISTORY — DX: Acute myocardial infarction, unspecified: I21.9

## 2019-09-26 DIAGNOSIS — R630 Anorexia: Secondary | ICD-10-CM | POA: Diagnosis not present

## 2019-09-26 DIAGNOSIS — E1121 Type 2 diabetes mellitus with diabetic nephropathy: Secondary | ICD-10-CM | POA: Diagnosis not present

## 2019-10-03 DIAGNOSIS — E1121 Type 2 diabetes mellitus with diabetic nephropathy: Secondary | ICD-10-CM | POA: Diagnosis not present

## 2019-10-11 ENCOUNTER — Emergency Department (HOSPITAL_COMMUNITY): Payer: Medicare Other

## 2019-10-11 ENCOUNTER — Other Ambulatory Visit: Payer: Self-pay

## 2019-10-11 ENCOUNTER — Inpatient Hospital Stay (HOSPITAL_COMMUNITY)
Admission: EM | Admit: 2019-10-11 | Discharge: 2019-10-13 | DRG: 282 | Disposition: A | Discharge status: Home Health | Payer: Medicare Other | Attending: Family Medicine | Admitting: Family Medicine

## 2019-10-11 ENCOUNTER — Encounter (HOSPITAL_COMMUNITY): Payer: Self-pay

## 2019-10-11 DIAGNOSIS — R54 Age-related physical debility: Secondary | ICD-10-CM | POA: Diagnosis present

## 2019-10-11 DIAGNOSIS — G309 Alzheimer's disease, unspecified: Secondary | ICD-10-CM | POA: Diagnosis not present

## 2019-10-11 DIAGNOSIS — M25512 Pain in left shoulder: Secondary | ICD-10-CM | POA: Diagnosis not present

## 2019-10-11 DIAGNOSIS — I2102 ST elevation (STEMI) myocardial infarction involving left anterior descending coronary artery: Principal | ICD-10-CM | POA: Diagnosis present

## 2019-10-11 DIAGNOSIS — E119 Type 2 diabetes mellitus without complications: Secondary | ICD-10-CM | POA: Diagnosis not present

## 2019-10-11 DIAGNOSIS — Z66 Do not resuscitate: Secondary | ICD-10-CM | POA: Diagnosis not present

## 2019-10-11 DIAGNOSIS — I249 Acute ischemic heart disease, unspecified: Secondary | ICD-10-CM | POA: Diagnosis not present

## 2019-10-11 DIAGNOSIS — Z20822 Contact with and (suspected) exposure to covid-19: Secondary | ICD-10-CM | POA: Diagnosis not present

## 2019-10-11 DIAGNOSIS — I10 Essential (primary) hypertension: Secondary | ICD-10-CM | POA: Diagnosis present

## 2019-10-11 DIAGNOSIS — S42202A Unspecified fracture of upper end of left humerus, initial encounter for closed fracture: Secondary | ICD-10-CM | POA: Diagnosis not present

## 2019-10-11 DIAGNOSIS — Z7982 Long term (current) use of aspirin: Secondary | ICD-10-CM | POA: Diagnosis not present

## 2019-10-11 DIAGNOSIS — R4182 Altered mental status, unspecified: Secondary | ICD-10-CM | POA: Diagnosis present

## 2019-10-11 DIAGNOSIS — R404 Transient alteration of awareness: Secondary | ICD-10-CM

## 2019-10-11 DIAGNOSIS — Z79899 Other long term (current) drug therapy: Secondary | ICD-10-CM | POA: Diagnosis not present

## 2019-10-11 DIAGNOSIS — Z853 Personal history of malignant neoplasm of breast: Secondary | ICD-10-CM

## 2019-10-11 DIAGNOSIS — Z7984 Long term (current) use of oral hypoglycemic drugs: Secondary | ICD-10-CM

## 2019-10-11 DIAGNOSIS — F0150 Vascular dementia without behavioral disturbance: Secondary | ICD-10-CM | POA: Diagnosis present

## 2019-10-11 DIAGNOSIS — I351 Nonrheumatic aortic (valve) insufficiency: Secondary | ICD-10-CM | POA: Diagnosis not present

## 2019-10-11 DIAGNOSIS — Z9010 Acquired absence of unspecified breast and nipple: Secondary | ICD-10-CM

## 2019-10-11 DIAGNOSIS — W19XXXA Unspecified fall, initial encounter: Secondary | ICD-10-CM | POA: Diagnosis present

## 2019-10-11 DIAGNOSIS — F0280 Dementia in other diseases classified elsewhere without behavioral disturbance: Secondary | ICD-10-CM | POA: Diagnosis not present

## 2019-10-11 DIAGNOSIS — H409 Unspecified glaucoma: Secondary | ICD-10-CM | POA: Diagnosis present

## 2019-10-11 DIAGNOSIS — R627 Adult failure to thrive: Secondary | ICD-10-CM | POA: Diagnosis not present

## 2019-10-11 LAB — LIPID PANEL
Cholesterol: 145 mg/dL (ref 0–200)
HDL: 33 mg/dL — ABNORMAL LOW (ref >40)
LDL Cholesterol: 86 mg/dL (ref 0–99)
Total CHOL/HDL Ratio: 4.4 RATIO
Triglycerides: 131 mg/dL (ref <150)
VLDL: 26 mg/dL (ref 0–40)

## 2019-10-11 LAB — CBC WITH DIFFERENTIAL/PLATELET
Abs Immature Granulocytes: 0.06 10*3/uL (ref 0.00–0.07)
Basophils Absolute: 0 10*3/uL (ref 0.0–0.1)
Basophils Relative: 0 %
Eosinophils Absolute: 0 10*3/uL (ref 0.0–0.5)
Eosinophils Relative: 0 %
HCT: 30.1 % — ABNORMAL LOW (ref 36.0–46.0)
Hemoglobin: 9.1 g/dL — ABNORMAL LOW (ref 12.0–15.0)
Immature Granulocytes: 1 %
Lymphocytes Relative: 11 %
Lymphs Abs: 1.3 10*3/uL (ref 0.7–4.0)
MCH: 28.3 pg (ref 26.0–34.0)
MCHC: 30.2 g/dL (ref 30.0–36.0)
MCV: 93.5 fL (ref 80.0–100.0)
Monocytes Absolute: 1.2 10*3/uL — ABNORMAL HIGH (ref 0.1–1.0)
Monocytes Relative: 10 %
Neutro Abs: 9.2 10*3/uL — ABNORMAL HIGH (ref 1.7–7.7)
Neutrophils Relative %: 78 %
Platelets: 233 10*3/uL (ref 150–400)
RBC: 3.22 MIL/uL — ABNORMAL LOW (ref 3.87–5.11)
RDW: 12.2 % (ref 11.5–15.5)
WBC: 11.8 10*3/uL — ABNORMAL HIGH (ref 4.0–10.5)
nRBC: 0 % (ref 0.0–0.2)

## 2019-10-11 LAB — URINALYSIS, ROUTINE W REFLEX MICROSCOPIC
Bilirubin Urine: NEGATIVE
Glucose, UA: >=500 mg/dL — AB
Hgb urine dipstick: NEGATIVE
Ketones, ur: NEGATIVE mg/dL
Nitrite: NEGATIVE
Protein, ur: NEGATIVE mg/dL
Specific Gravity, Urine: 1.025 (ref 1.005–1.030)
pH: 5 (ref 5.0–8.0)

## 2019-10-11 LAB — COMPREHENSIVE METABOLIC PANEL
ALT: 26 U/L (ref 0–44)
AST: 39 U/L (ref 15–41)
Albumin: 3.3 g/dL — ABNORMAL LOW (ref 3.5–5.0)
Alkaline Phosphatase: 61 U/L (ref 38–126)
Anion gap: 14 (ref 5–15)
BUN: 32 mg/dL — ABNORMAL HIGH (ref 8–23)
CO2: 21 mmol/L — ABNORMAL LOW (ref 22–32)
Calcium: 8.9 mg/dL (ref 8.9–10.3)
Chloride: 100 mmol/L (ref 98–111)
Creatinine, Ser: 1.25 mg/dL — ABNORMAL HIGH (ref 0.44–1.00)
GFR calc Af Amer: 43 mL/min — ABNORMAL LOW (ref >60)
GFR calc non Af Amer: 37 mL/min — ABNORMAL LOW (ref >60)
Glucose, Bld: 225 mg/dL — ABNORMAL HIGH (ref 70–99)
Potassium: 3.5 mmol/L (ref 3.5–5.1)
Sodium: 135 mmol/L (ref 135–145)
Total Bilirubin: 0.9 mg/dL (ref 0.3–1.2)
Total Protein: 7.7 g/dL (ref 6.5–8.1)

## 2019-10-11 LAB — I-STAT VENOUS BLOOD GAS, ED
Acid-Base Excess: 1 mmol/L (ref 0.0–2.0)
Bicarbonate: 24.6 mmol/L (ref 20.0–28.0)
Calcium, Ion: 1.03 mmol/L — ABNORMAL LOW (ref 1.15–1.40)
HCT: 30 % — ABNORMAL LOW (ref 36.0–46.0)
Hemoglobin: 10.2 g/dL — ABNORMAL LOW (ref 12.0–15.0)
O2 Saturation: 49 %
Potassium: 3.4 mmol/L — ABNORMAL LOW (ref 3.5–5.1)
Sodium: 137 mmol/L (ref 135–145)
TCO2: 26 mmol/L (ref 22–32)
pCO2, Ven: 35.9 mmHg — ABNORMAL LOW (ref 44.0–60.0)
pH, Ven: 7.445 — ABNORMAL HIGH (ref 7.250–7.430)
pO2, Ven: 25 mmHg — CL (ref 32.0–45.0)

## 2019-10-11 LAB — TSH: TSH: 1.16 u[IU]/mL (ref 0.350–4.500)

## 2019-10-11 LAB — CBG MONITORING, ED: Glucose-Capillary: 225 mg/dL — ABNORMAL HIGH (ref 70–99)

## 2019-10-11 LAB — SARS CORONAVIRUS 2 BY RT PCR (HOSPITAL ORDER, PERFORMED IN ~~LOC~~ HOSPITAL LAB): SARS Coronavirus 2: NEGATIVE

## 2019-10-11 LAB — TROPONIN I (HIGH SENSITIVITY)
Troponin I (High Sensitivity): 1604 ng/L (ref <18)
Troponin I (High Sensitivity): 1830 ng/L (ref <18)
Troponin I (High Sensitivity): 1636 ng/L (ref <18)

## 2019-10-11 LAB — ETHANOL: Alcohol, Ethyl (B): <10 mg/dL (ref <10)

## 2019-10-11 LAB — AMMONIA: Ammonia: 10 umol/L (ref 9–35)

## 2019-10-11 LAB — GLUCOSE, CAPILLARY: Glucose-Capillary: 142 mg/dL — ABNORMAL HIGH (ref 70–99)

## 2019-10-11 MED ORDER — SODIUM CHLORIDE 0.9 % IV BOLUS
1000.0000 mL | Freq: Once | INTRAVENOUS | Status: AC
  Administered 2019-10-11: 1000 mL via INTRAVENOUS

## 2019-10-11 MED ORDER — ASPIRIN 300 MG RE SUPP: 300.0000 mg | RECTAL | Status: DC

## 2019-10-11 MED ORDER — ROSUVASTATIN CALCIUM 20 MG PO TABS
20.0000 mg | ORAL_TABLET | Freq: Every day | ORAL | Status: DC
  Administered 2019-10-11 – 2019-10-13 (×3): 20 mg via ORAL
  Filled 2019-10-11 (×3): qty 1

## 2019-10-11 MED ORDER — CARVEDILOL 3.125 MG PO TABS
3.1250 mg | ORAL_TABLET | Freq: Two times a day (BID) | ORAL | Status: DC
  Administered 2019-10-11 – 2019-10-13 (×4): 3.125 mg via ORAL
  Filled 2019-10-11 (×5): qty 1

## 2019-10-11 MED ORDER — INSULIN ASPART 100 UNIT/ML ~~LOC~~ SOLN: 0.0000 [IU] | Freq: Every day | SUBCUTANEOUS | Status: DC

## 2019-10-11 MED ORDER — HEPARIN (PORCINE) 25000 UT/250ML-% IV SOLN
750.0000 [IU]/h | INTRAVENOUS | Status: DC
  Administered 2019-10-11: 1100 [IU]/h via INTRAVENOUS
  Administered 2019-10-12: 700 [IU]/h via INTRAVENOUS
  Filled 2019-10-11 (×2): qty 250

## 2019-10-11 MED ORDER — INSULIN ASPART 100 UNIT/ML ~~LOC~~ SOLN
0.0000 [IU] | Freq: Three times a day (TID) | SUBCUTANEOUS | Status: DC
  Administered 2019-10-12 – 2019-10-13 (×3): 2 [IU] via SUBCUTANEOUS

## 2019-10-11 MED ORDER — ASPIRIN 81 MG PO CHEW: 324.0000 mg | CHEWABLE_TABLET | ORAL | Status: DC

## 2019-10-11 MED ORDER — CLOPIDOGREL BISULFATE 75 MG PO TABS
75.0000 mg | ORAL_TABLET | Freq: Every day | ORAL | Status: DC
  Administered 2019-10-11 – 2019-10-13 (×3): 75 mg via ORAL
  Filled 2019-10-11 (×3): qty 1

## 2019-10-11 MED ORDER — ONDANSETRON HCL 4 MG/2ML IJ SOLN: 4.0000 mg | Freq: Four times a day (QID) | INTRAMUSCULAR | Status: DC | PRN

## 2019-10-11 MED ORDER — NITROGLYCERIN 0.4 MG SL SUBL: 0.4000 mg | SUBLINGUAL_TABLET | SUBLINGUAL | Status: DC | PRN

## 2019-10-11 MED ORDER — ACETAMINOPHEN 325 MG PO TABS: 650.0000 mg | ORAL_TABLET | ORAL | Status: DC | PRN

## 2019-10-11 NOTE — ED Notes (Signed)
This RN attempted to contact daughter Malachy Mood regarding pt bed placement & hearing aids found. Daughter did not answer phone call, will pass message along to upstairs RN in report.

## 2019-10-11 NOTE — H&P (Signed)
History and Physical    Vickie Young:[id number] DOB: 12/19/1923 DOA: 10/11/2019  PCP: [name], MD  Patient coming from: Home  I have personally briefly reviewed patient's old medical records in [place]  Chief Complaint: Failure to thrive  HPI: Vickie Young is a 86 y.o. female with medical history significant of DM2, HTN, falls.  Pt in to ED with concerns for poor appetite, decreased responsiveness.  This is at the end of a week which began on Monday with her having spastic type fasciculations first of the legs, and then today prior to her being brought to the emergency room was having similar fasciculations in both arms and the legs. Also this week she has been profoundly weak not able to stand up besides going to the bathroom and back. Having a hard time sitting up without assistance. She has fallen at least 3 or 4 times this week once on her right hip and arm. The daughter is concerned because now she is not feeding herself with that arm.  At no time this week has she complained of CP, SOB, orthopnea, edema.  She continues to deny any symptoms here in the ED.   ED Course: In the ED the work up is surprisngly remarkable for an apparent anterior wall STEMI with trops of 1600 and 1800 on repeat!  Pt continues to deny all symptoms, resting comfortably flat on her back.  BP 130/55.  Cards consulted: See Dr. Allison Quarry note.  They plan on medical management and no invasive cath.  Hospitalist asked to admit.   Review of Systems: As per HPI, otherwise all review of systems negative.  Past Medical History:  Diagnosis Date  . Breast cancer in female [facility])    remote  . Closed fracture of left distal radius 12/28/2018  . Closed fracture of left proximal humerus 12/28/2018  . Diabetes mellitus without complication ([place])   . Essential hypertension 12/28/2018  . Glaucoma     Past Surgical History:  Procedure Laterality Date  . herniated disc     remote  .  MASTECTOMY     remote  . ORIF HUMERUS FRACTURE Left 12/28/2018   Procedure: OPEN REDUCTION INTERNAL FIXATION (ORIF) PROXIMAL HUMERUS FRACTURE;  Surgeon: Marchia Bond, MD;  Location: [place];  Service: Orthopedics;  Laterality: Left;  . ORIF WRIST FRACTURE Left 12/28/2018   Procedure: OPEN REDUCTION INTERNAL FIXATION (ORIF) WRIST FRACTURE;  Surgeon: Marchia Bond, MD;  Location: [place];  Service: Orthopedics;  Laterality: Left;     reports that she has never smoked. She uses smokeless tobacco. She reports that she does not drink alcohol and does not use drugs.  No Known Allergies  No family history on file. No sick contacts  Prior to Admission medications   Medication Sig Start Date End Date Taking? Authorizing Provider  acetaminophen (TYLENOL) 500 MG tablet Take 1 tablet (500 mg total) by mouth every 8 (eight) hours as needed for moderate pain. 01/01/19 01/01/20  Thurnell Lose, MD  amLODipine (NORVASC) 10 MG tablet Take 0.5 tablets (5 mg total) by mouth daily. 01/01/19   Thurnell Lose, MD  aspirin (ASPIRIN EC) 81 MG EC tablet Take 81 mg by mouth daily. Swallow whole.    [provider]  COMBIGAN 0.2-0.5 % ophthalmic solution Place 1 drop into both eyes at bedtime.  11/10/14   [provider]  ferrous sulfate 325 (65 FE) MG tablet Take 1 tablet (325 mg total) by mouth 2 (two) times daily  with a meal. 01/01/19   Thurnell Lose, MD  irbesartan (AVAPRO) 75 MG tablet Take 1 tablet (75 mg total) by mouth daily. 01/02/19   Thurnell Lose, MD  metFORMIN (GLUCOPHAGE) 500 MG tablet Take 500 mg by mouth daily.  09/18/14   [provider]  Multiple Vitamin (MULTIVITAMIN) capsule Take 1 capsule by mouth daily.    [provider]  polyethylene glycol (MIRALAX) 17 g packet Take 17 g by mouth daily. 01/01/19   Thurnell Lose, MD  TRADJENTA 5 MG TABS tablet Take 5 mg by mouth daily. 09/05/14   [provider]  Travoprost, BAK Free, (TRAVATAN) 0.004 % SOLN  ophthalmic solution Place 1 drop into both eyes at bedtime.    [provider]    Physical Exam: Vitals:   10/11/19 1654 10/11/19 1745 10/11/19 1814 10/11/19 2000  BP: 110/87 (!) 115/57 (!) 130/55 118/67  Pulse: 80 79 82 77  Resp: 13 19 11 16   Temp:      TempSrc:      SpO2: 99% 98% 100% 97%  Weight:      Height:        Constitutional: NAD, calm, comfortable Eyes: PERRL, lids and conjunctivae normal ENMT: Mucous membranes are moist. Posterior pharynx clear of any exudate or lesions.Normal dentition.  Neck: normal, supple, no masses, no thyromegaly Respiratory: clear to auscultation bilaterally, no wheezing, no crackles. Normal respiratory effort. No accessory muscle use.  Cardiovascular: Regular rate and rhythm, no murmurs / rubs / gallops. No extremity edema. 2+ pedal pulses. No carotid bruits.  Abdomen: no tenderness, no masses palpated. No hepatosplenomegaly. Bowel sounds positive.  Musculoskeletal: no clubbing / cyanosis. No joint deformity upper and lower extremities. Good ROM, no contractures. Normal muscle tone.  Skin: no rashes, lesions, ulcers. No induration Neurologic: CN 2-12 grossly intact. Sensation intact, DTR normal. Strength 5/5 in all 4.  Psychiatric: Normal judgment and insight. Alert and oriented x 3. Normal mood.    Labs on Admission: I have personally reviewed following labs and imaging studies  CBC: Recent Labs  Lab 10/11/19 1513 10/11/19 1519  WBC 11.8*  --   NEUTROABS 9.2*  --   HGB 9.1* 10.2*  HCT 30.1* 30.0*  MCV 93.5  --   PLT 233  --    Basic Metabolic Panel: Recent Labs  Lab 10/11/19 1513 10/11/19 1519  NA 135 137  K 3.5 3.4*  CL 100  --   CO2 21*  --   GLUCOSE 225*  --   BUN 32*  --   CREATININE 1.25*  --   CALCIUM 8.9  --    GFR: Estimated Creatinine Clearance: 31.8 mL/min (A) (by C-G formula based on SCr of 1.25 mg/dL (H)). Liver Function Tests: Recent Labs  Lab 10/11/19 1513  AST 39  ALT 26  ALKPHOS 61  BILITOT  0.9  PROT 7.7  ALBUMIN 3.3*   No results for input(s): LIPASE, AMYLASE in the last 168 hours. Recent Labs  Lab 10/11/19 1513  AMMONIA 10   Coagulation Profile: No results for input(s): INR, PROTIME in the last 168 hours. Cardiac Enzymes: No results for input(s): CKTOTAL, CKMB, CKMBINDEX, TROPONINI in the last 168 hours. BNP (last 3 results) No results for input(s): PROBNP in the last 8760 hours. HbA1C: No results for input(s): HGBA1C in the last 72 hours. CBG: Recent Labs  Lab 10/11/19 1512  GLUCAP 225*   Lipid Profile: No results for input(s): CHOL, HDL, LDLCALC, TRIG, CHOLHDL, LDLDIRECT in the  last 72 hours. Thyroid Function Tests: Recent Labs    10/11/19 1513  TSH 1.160   Anemia Panel: No results for input(s): VITAMINB12, FOLATE, FERRITIN, TIBC, IRON, RETICCTPCT in the last 72 hours. Urine analysis:    Component Value Date/Time   COLORURINE YELLOW 10/11/2019 1503   APPEARANCEUR CLEAR 10/11/2019 1503   LABSPEC 1.025 10/11/2019 1503   PHURINE 5.0 10/11/2019 1503   GLUCOSEU >=500 (A) 10/11/2019 1503   HGBUR NEGATIVE 10/11/2019 1503   BILIRUBINUR NEGATIVE 10/11/2019 1503   KETONESUR NEGATIVE 10/11/2019 1503   PROTEINUR NEGATIVE 10/11/2019 1503   UROBILINOGEN 1.0 08/07/2007 2147   NITRITE NEGATIVE 10/11/2019 1503   LEUKOCYTESUR LARGE (A) 10/11/2019 1503    Radiological Exams on Admission: DG Chest 2 View  Result Date: 10/11/2019 CLINICAL DATA:  86 year old female with history of altered level of consciousness. History of trauma from a fall. Pain. EXAM: CHEST - 2 VIEW COMPARISON:  Chest x-ray 08/06/2019. FINDINGS: Lung volumes are normal. No consolidative airspace disease. No pleural effusions. No pneumothorax. No pulmonary nodule or mass noted. Pulmonary vasculature and the cardiomediastinal silhouette are within normal limits. Atherosclerotic calcifications in the thoracic aorta. Status post left shoulder ORIF. IMPRESSION: 1. No radiographic evidence of acute  cardiopulmonary disease. 2. Aortic atherosclerosis. Electronically Signed   By: Vinnie Langton M.D.   On: 10/11/2019 14:54   DG Shoulder Right  Result Date: 10/11/2019 CLINICAL DATA:  Fall, right shoulder pain EXAM: RIGHT SHOULDER - 2+ VIEW COMPARISON:  None. FINDINGS: There is no evidence of fracture or dislocation. Mild arthropathy of the glenohumeral and acromioclavicular joints. Soft tissues are unremarkable. IMPRESSION: No acute osseous abnormality. Mild arthropathy of the right shoulder. Electronically Signed   By: Davina Poke D.O.   On: 10/11/2019 14:54   CT HEAD WO CONTRAST  Result Date: 10/11/2019 CLINICAL DATA:  Altered mental status EXAM: CT HEAD WITHOUT CONTRAST TECHNIQUE: Contiguous axial images were obtained from the base of the skull through the vertex without intravenous contrast. COMPARISON:  04/26/2019 FINDINGS: Brain: There is no acute intracranial hemorrhage, mass effect, or edema. No new loss of gray differentiation. There is no extra-axial fluid collection. Patchy confluent areas of hypoattenuation in the supratentorial white matter are nonspecific but probably reflect stable chronic microvascular ischemic changes. Small chronic cortical infarct of the left precentral gyrus. Prominence of the ventricles and sulci reflects stable parenchymal volume loss. Vascular: There is atherosclerotic calcification at the skull base. Skull: Calvarium is unremarkable. Sinuses/Orbits: No acute finding. Other: None. IMPRESSION: No acute intracranial hemorrhage or evidence of acute infarction. Chronic findings detailed above. Electronically Signed   By: Macy Mis M.D.   On: 10/11/2019 18:30   DG Shoulder Left  Result Date: 10/11/2019 CLINICAL DATA:  Left shoulder pain after fall EXAM: LEFT SHOULDER - 2+ VIEW COMPARISON:  12/29/2018 FINDINGS: Prior ORIF of the proximal left humerus via a lateral sideplate and screw fixation construct. Hardware appears intact and unchanged in alignment.  There is bridging callus formation over the fracture site. No definite perihardware fracture. Mild arthropathy of the AC in glenohumeral joints. Soft tissues within normal limits. IMPRESSION: Prior ORIF of the proximal left humerus without evidence of hardware complication. No acute fracture or dislocation. Electronically Signed   By: Davina Poke D.O.   On: 10/11/2019 14:57    EKG: Independently reviewed.  Assessment/Plan Principal Problem:   Silent ST elevation myocardial infarction (STEMI) involving left anterior descending (LAD) coronary artery of anterior wall (HCC) Active Problems:   Diabetes mellitus without complication ([place])  Essential hypertension   Altered mental status   Dementia due to Alzheimer's disease ([place])   Failure to thrive in adult    1. Silent anterior wall STEMI - 1. ACS pathway 2. See Dr. Allison Quarry consult note 1. No plans for cath or invasive workup 2. Heparin gtt 3. Putting on Plavix only, not ordering ASA per his recs 4. Mod dose statin 5. BB 6. 2D echo 3. Tele monitor 2. HTN -  1. Hold amlodipine 2. Med rec pending, possibly to continue ARB 3. DM2 - 1. Holding home PO hypoglycemics 2. Mod scale SSI AC/HS 4. Dementia due to alzheimers - 1. Cont home meds (if any) when med rec completed  DVT prophylaxis: Heparin gtt Code Status: DNR - confirmed with Lucia Estelle phone [phone] Family Communication: Spoke with Lucia Estelle on phone, Dr. Ellyn Hack spoke with other daughter [name] Disposition Plan: TBD Consults called: Dr. Ellyn Hack Admission status: Admit to inpatient  Severity of Illness: The appropriate patient status for this patient is INPATIENT. Inpatient status is judged to be reasonable and necessary in order to provide the required intensity of service to ensure the patient's safety. The patient's presenting symptoms, physical exam findings, and initial radiographic and laboratory data in the context of their chronic comorbidities is felt to  place them at high risk for further clinical deterioration. Furthermore, it is not anticipated that the patient will be medically stable for discharge from the hospital within 2 midnights of admission. The following factors support the patient status of inpatient.   IP status for STEMI.  * I certify that at the point of admission it is my clinical judgment that the patient will require inpatient hospital care spanning beyond 2 midnights from the point of admission due to high intensity of service, high risk for further deterioration and high frequency of surveillance required.*    Zania Kalisz M. DO [facility]  How to contact the [facility] Attending or Consulting provider [place] or covering provider during after hours [place], for this patient?  1. Check the care team in [facility] and look for a) attending/consulting [facility] provider listed and b) the [facility] team listed 2. Log into [email or web address]  Amion Physician Scheduling and messaging for groups and whole hospitals  On call and physician scheduling software for group practices, residents, hospitalists and other medical providers for call, clinic, rotation and shift schedules. On[employer] is a hospital-wide system for scheduling doctors and paging doctors on call. EasyPlot is for scientific plotting and data analysis.  [email or web address]  and use [place]'s universal password to access. If you do not have the password, please contact the hospital operator.  3. Locate the [facility] provider you are looking for under [facility] and page to a number that you can be directly reached. 4. If you still have difficulty reaching the provider, please page the [facility] (Director on Call) for the Hospitalists listed on amion for assistance.  10/11/2019, 8:30 PM

## 2019-10-11 NOTE — ED Notes (Signed)
Dr. Sedonia Small notified of elevated trop

## 2019-10-11 NOTE — ED Provider Notes (Signed)
  Provider Note MRN:  [id number]  Arrival date & time: 10/11/19    ED Course and Medical Decision Making  Assumed care from Dr. Tyrone Nine at shift change.  Reportedly not acting normal at home per family but now with son at bedside stating she is currently at her baseline.  Awaiting testing and imaging, suspect will be able to be discharged.  5 PM update: I was informed of critical lab value, troponin of 1600.  Looking back at the EKG on arrival, there is some concerning morphology anteriorly.  Upon my reevaluation of the patient she is in no acute distress, I asked her multiple times and she continues to deny any chest pain now or recently.  Nurse was able to have a long conversation with patient's daughter an hour ago, and per daughter patient has not had any recent chest pain.  I discussed the case with Dr. Sallyanne Kuster, who agrees with the concerning nature of the EKG and troponin, patient has likely had a cardiac event but it is difficult to determine when, cardiology will come to see the patient and we will obtain the second troponin to try to determine if patient is uptrending or downtrending.  I have since tried to contact patient's daughter but there has been no answer.  Evaluated by cardiology, troponin is minimally uptrending, this time not a good PCI candidate, will admit to medicine.  .Critical Care Performed by: Maudie Flakes, MD Authorized by: Maudie Flakes, MD   Critical care provider statement:    Critical care time (minutes):  34   Critical care was necessary to treat or prevent imminent or life-threatening deterioration of the following conditions: Acute coronary syndrome.   Critical care was time spent personally by me on the following activities:  Discussions with consultants, evaluation of patient's response to treatment, examination of patient, ordering and performing treatments and interventions, ordering and review of laboratory studies, ordering and review of radiographic  studies, pulse oximetry, re-evaluation of patient's condition, obtaining history from patient or surrogate and review of old charts   I assumed direction of critical care for this patient from another provider in my specialty: yes      Final Clinical Impressions(s) / ED Diagnoses     ICD-10-CM   1. Transient alteration of awareness  R40.4   2. Acute coronary syndrome [facility])  I24.9     ED Discharge Orders    None      Discharge Instructions   None     Barth Kirks. Sedonia Small, [place] [email or web address]    Maudie Flakes, MD 10/11/19 [phone]

## 2019-10-11 NOTE — ED Notes (Signed)
Cardiology at bedside.

## 2019-10-11 NOTE — ED Triage Notes (Addendum)
Pt from home via ems; called out for stroke; hx dementia, dm;  LSN Wednesday; per family,  pt has fallen at least twice since Wednesday; family found pt today on floor with tv on top of her; family states pt unable to lift arms; pt had R shoulder replacement last year; has some difficulty lifting arms; some aphasia noted with ems; unsure of pt's base line , per family "this isn't her"; no obvious injury noted by ems;pt denies pain;  pt not on thinners; able to ambulate with assistance to ambulance  134/82 HR 74 RR 20 94% RA GCS 14 cbg home monitor 245; 351 with ems

## 2019-10-11 NOTE — ED Notes (Signed)
Malachy Mood (pt's Daughter and healthcare POA) [phone]

## 2019-10-11 NOTE — ED Provider Notes (Signed)
[place] EMERGENCY DEPARTMENT Provider Note   CSN: [id number] Arrival date & time: 10/11/19  1339     History Chief Complaint  Patient presents with  . Fall    Vickie Young is a 86 y.o. female.  86 yo F with a chief complaints of altered mental status.  Patient is demented and has difficulty providing history.  Level 5 caveat altered mental status.  Per EMS the patient was reportedly acting different than when family saw her about a week ago.  Has had at least 2 falls since then.  No obvious injury.  Patient denies any areas of pain.  She denies cough congestion or fever denies nausea vomiting or diarrhea denies abdominal pain denies chest pain denies shortness of breath.  She denies headache or neck pain.  The history is provided by the EMS personnel and the patient.  Fall This is a new problem. The current episode started yesterday. The problem occurs constantly. The problem has not changed since onset.Pertinent negatives include no chest pain, no headaches and no shortness of breath. Nothing aggravates the symptoms. Nothing relieves the symptoms. She has tried nothing for the symptoms. The treatment provided no relief.       Past Medical History:  Diagnosis Date  . Breast cancer in female [facility])    remote  . Closed fracture of left distal radius 12/28/2018  . Closed fracture of left proximal humerus 12/28/2018  . Diabetes mellitus without complication ([place])   . Essential hypertension 12/28/2018  . Glaucoma     Patient Active Problem List   Diagnosis Date Noted  . Humerus fracture 12/29/2018  . Closed fracture of left proximal humerus 12/28/2018  . Closed fracture of left distal radius 12/28/2018  . Essential hypertension 12/28/2018  . Glaucoma   . Diabetes mellitus without complication [facility])     Past Surgical History:  Procedure Laterality Date  . herniated disc     remote  . MASTECTOMY     remote  . ORIF HUMERUS FRACTURE Left 12/28/2018    Procedure: OPEN REDUCTION INTERNAL FIXATION (ORIF) PROXIMAL HUMERUS FRACTURE;  Surgeon: Marchia Bond, MD;  Location: [place];  Service: Orthopedics;  Laterality: Left;  . ORIF WRIST FRACTURE Left 12/28/2018   Procedure: OPEN REDUCTION INTERNAL FIXATION (ORIF) WRIST FRACTURE;  Surgeon: Marchia Bond, MD;  Location: [place];  Service: Orthopedics;  Laterality: Left;     OB History   No obstetric history on file.     No family history on file.  Social History   Tobacco Use  . Smoking status: Never Smoker  . Smokeless tobacco: Current User  Substance Use Topics  . Alcohol use: No  . Drug use: No    Home Medications Prior to Admission medications   Medication Sig Start Date End Date Taking? Authorizing Provider  acetaminophen (TYLENOL) 500 MG tablet Take 1 tablet (500 mg total) by mouth every 8 (eight) hours as needed for moderate pain. 01/01/19 01/01/20  Thurnell Lose, MD  amLODipine (NORVASC) 10 MG tablet Take 0.5 tablets (5 mg total) by mouth daily. 01/01/19   Thurnell Lose, MD  aspirin (ASPIRIN EC) 81 MG EC tablet Take 81 mg by mouth daily. Swallow whole.    [provider]  COMBIGAN 0.2-0.5 % ophthalmic solution Place 1 drop into both eyes at bedtime.  11/10/14   [provider]  ferrous sulfate 325 (65 FE) MG tablet Take 1 tablet (325 mg total) by mouth 2 (two) times daily with  a meal. 01/01/19   Thurnell Lose, MD  irbesartan (AVAPRO) 75 MG tablet Take 1 tablet (75 mg total) by mouth daily. 01/02/19   Thurnell Lose, MD  metFORMIN (GLUCOPHAGE) 500 MG tablet Take 500 mg by mouth daily.  09/18/14   [provider]  Multiple Vitamin (MULTIVITAMIN) capsule Take 1 capsule by mouth daily.    [provider]  polyethylene glycol (MIRALAX) 17 g packet Take 17 g by mouth daily. 01/01/19   Thurnell Lose, MD  TRADJENTA 5 MG TABS tablet Take 5 mg by mouth daily. 09/05/14   [provider]  Travoprost, BAK Free, (TRAVATAN) 0.004 % SOLN  ophthalmic solution Place 1 drop into both eyes at bedtime.    [provider]    Allergies    Patient has no known allergies.  Review of Systems   Review of Systems  Unable to perform ROS: Dementia  Constitutional: Negative for chills and fever.  HENT: Negative for congestion and rhinorrhea.   Eyes: Negative for redness and visual disturbance.  Respiratory: Negative for shortness of breath and wheezing.   Cardiovascular: Negative for chest pain and palpitations.  Gastrointestinal: Negative for nausea and vomiting.  Genitourinary: Negative for dysuria and urgency.  Musculoskeletal: Negative for arthralgias and myalgias.  Skin: Negative for pallor and wound.  Neurological: Negative for dizziness and headaches.    Physical Exam Updated Vital Signs BP (!) 129/46   Temp 98.4 F (36.9 C) (Oral)   Resp 15   Ht 6' (1.829 m)   Wt 87.3 kg   BMI 26.10 kg/m   Physical Exam Vitals and nursing note reviewed.  Constitutional:      General: She is not in acute distress.    Appearance: She is well-developed. She is not diaphoretic.  HENT:     Head: Normocephalic and atraumatic.  Eyes:     Pupils: Pupils are equal, round, and reactive to light.  Cardiovascular:     Rate and Rhythm: Normal rate and regular rhythm.     Heart sounds: No murmur heard.  No friction rub. No gallop.   Pulmonary:     Effort: Pulmonary effort is normal.     Breath sounds: No wheezing or rales.  Abdominal:     General: There is no distension.     Palpations: Abdomen is soft.     Tenderness: There is no abdominal tenderness.  Musculoskeletal:        General: Tenderness present.     Cervical back: Normal range of motion and neck supple.     Comments: Appears to have some tenderness with the right shoulder.  Skin:    General: Skin is warm and dry.  Neurological:     Mental Status: She is alert and oriented to person, place, and time.     Cranial Nerves: Cranial nerves are intact.     Sensory:  Sensation is intact.     Motor: Motor function is intact.     Coordination: Coordination is intact.     Comments: Difficult to perform due to dementia but no obvious deficit.  Psychiatric:        Behavior: Behavior normal.     ED Results / Procedures / Treatments   Labs (all labs ordered are listed, but only abnormal results are displayed) Labs Reviewed  URINE CULTURE  SARS CORONAVIRUS 2 BY RT PCR ([place] [facility])  AMMONIA  COMPREHENSIVE METABOLIC PANEL  ETHANOL  CBC WITH DIFFERENTIAL/PLATELET  URINALYSIS,  ROUTINE W REFLEX MICROSCOPIC  TSH  CBG MONITORING, ED  TROPONIN I (HIGH SENSITIVITY)    EKG EKG Interpretation  Date/Time:  Friday October 11 2019 13:46:27 EDT Ventricular Rate:  74 PR Interval:    QRS Duration: 122 QT Interval:  420 QTC Calculation: 466 R Axis:   -63 Text Interpretation: Sinus rhythm Atrial premature complex Left bundle branch block deep inverted t waves in v2 and 3 not seen on prior Confirmed by Deno Etienne [phone]) on 10/11/2019 2:23:58 PM   Radiology DG Chest 2 View  Result Date: 10/11/2019 CLINICAL DATA:  86 year old female with history of altered level of consciousness. History of trauma from a fall. Pain. EXAM: CHEST - 2 VIEW COMPARISON:  Chest x-ray 08/06/2019. FINDINGS: Lung volumes are normal. No consolidative airspace disease. No pleural effusions. No pneumothorax. No pulmonary nodule or mass noted. Pulmonary vasculature and the cardiomediastinal silhouette are within normal limits. Atherosclerotic calcifications in the thoracic aorta. Status post left shoulder ORIF. IMPRESSION: 1. No radiographic evidence of acute cardiopulmonary disease. 2. Aortic atherosclerosis. Electronically Signed   By: Vinnie Langton M.D.   On: 10/11/2019 14:54   DG Shoulder Right  Result Date: 10/11/2019 CLINICAL DATA:  Fall, right shoulder pain EXAM: RIGHT SHOULDER - 2+ VIEW COMPARISON:  None. FINDINGS: There is no evidence of  fracture or dislocation. Mild arthropathy of the glenohumeral and acromioclavicular joints. Soft tissues are unremarkable. IMPRESSION: No acute osseous abnormality. Mild arthropathy of the right shoulder. Electronically Signed   By: Davina Poke D.O.   On: 10/11/2019 14:54   DG Shoulder Left  Result Date: 10/11/2019 CLINICAL DATA:  Left shoulder pain after fall EXAM: LEFT SHOULDER - 2+ VIEW COMPARISON:  12/29/2018 FINDINGS: Prior ORIF of the proximal left humerus via a lateral sideplate and screw fixation construct. Hardware appears intact and unchanged in alignment. There is bridging callus formation over the fracture site. No definite perihardware fracture. Mild arthropathy of the AC in glenohumeral joints. Soft tissues within normal limits. IMPRESSION: Prior ORIF of the proximal left humerus without evidence of hardware complication. No acute fracture or dislocation. Electronically Signed   By: Davina Poke D.O.   On: 10/11/2019 14:57    Procedures Procedures (including critical care time)  Medications Ordered in ED Medications  sodium chloride 0.9 % bolus 1,000 mL (1,000 mLs Intravenous New Bag/Given 10/11/19 1433)    ED Course  I have reviewed the triage vital signs and the nursing notes.  Pertinent labs & imaging results that were available during my care of the patient were reviewed by me and considered in my medical decision making (see chart for details).    MDM Rules/Calculators/A&P                          86 yo F with a chief complaints of altered mental status.  Patient does have a history of dementia though looking back in her medical record.  Not sure what the differences from her baseline.  Will perform an altered mental status work-up.  Bolus of IV fluids.  Reassess.  Family member has arrived and is one of her sons that does not typically stay with her.  Per him he thinks she is at her baseline.  He does not notice any difference from the last time he was with her.   Awaiting blood work and imaging.  Signed out to Dr. Sedonia Small, please see his note for further details of care in the ED.   The  patients results and plan were reviewed and discussed.   Any x-rays performed were independently reviewed by myself.   Differential diagnosis were considered with the presenting HPI.  Medications  sodium chloride 0.9 % bolus 1,000 mL (1,000 mLs Intravenous New Bag/Given 10/11/19 1433)    Vitals:   10/11/19 1353 10/11/19 1354 10/11/19 1400  BP:   (!) 129/46  Resp:   15  Temp: 98.4 F (36.9 C)    TempSrc: Oral    Weight:  87.3 kg   Height:  6' (1.829 m)     Final diagnoses:  Transient alteration of awareness      Final Clinical Impression(s) / ED Diagnoses Final diagnoses:  Transient alteration of awareness    Rx / DC Orders ED Discharge Orders    None       Deno Etienne, DO 10/11/19 1509

## 2019-10-11 NOTE — Progress Notes (Signed)
ANTICOAGULATION CONSULT NOTE - Initial Consult  Pharmacy Consult for heparin x48 hr  Indication: chest pain/ACS   No Known Allergies  Patient Measurements: Height: 6' (182.9 cm) Weight: 87.3 kg (192 lb 7.4 oz) IBW/kg (Calculated) : 73.1 Heparin Dosing Weight: 87 kg  Vital Signs: Temp: 98.4 F (36.9 C) (06/25 1353) Temp Source: Oral (06/25 1353) BP: 130/55 (06/25 1814) Pulse Rate: 82 (06/25 1814)  Labs: Recent Labs    10/11/19 1513 10/11/19 1519 10/11/19 1721  HGB 9.1* 10.2*  --   HCT 30.1* 30.0*  --   PLT 233  --   --   CREATININE 1.25*  --   --   TROPONINIHS 1,604*  --  1,830*    Estimated Creatinine Clearance: 31.8 mL/min (A) (by C-G formula based on SCr of 1.25 mg/dL (H)).   Assessment: 86 yo F starting on heparin for STEMI for 48 hours only per MD consult. Patient with high risk of falls with 3-4 falls this week.   H/H anemic on admit, platelets wnl, troponins still up trending. Will start heparin with no bolus.  Goal of Therapy:  Heparin level 0.3-0.7 units/ml Monitor platelets by anticoagulation protocol: Yes   Plan:  Heparin 1100 units/hr, no bolus, order to end in 48 hours  F/u 8hr HL Monitor daily HL, CBC/plt Monitor for signs/symptoms of bleeding     Benetta Spar, PharmD, BCPS, BCCP Clinical Pharmacist  Please check AMION for all [place] phone numbers After 10:00 PM, call [place]

## 2019-10-11 NOTE — ED Notes (Signed)
Updated pt's daughter Johnnette Litter) on status and plan of care

## 2019-10-11 NOTE — Progress Notes (Signed)
Patient arrived to unit, Patient stable, VSS. Cardiac telemetry initiated and verified.  No complaints of pain, will continue to monitor.

## 2019-10-11 NOTE — Consult Note (Addendum)
Cardiology Consultation:   Patient ID: Vickie Young MRN: [id number]; DOB: [birth year]  Admit date: 10/11/2019 Date of Consult: 10/11/2019  Primary Care Provider: [name], MD [facility] Cardiologist: No primary care provider on file. [facility] Electrophysiologist:  None    Patient Profile:   Vickie Young is a 86 y.o. female with a hx of DM-2 and dementia (with failure to thrive) who is being seen today for the evaluation of elevated troponin and ST elevation on EKG at the request of Dr. Sedonia Small (ER physician, who took over the case after initially being seen by Dr. Deno Etienne).   History of Present Illness:   Vickie Young is a pleasantly demented 86 year old woman with history of diabetes on oral medications who has been having signs according to the daughter Vickie Young, who is the one who cooks and cleans for her) of what sounds like failure to thrive with poor appetite change in diet desire and taste. She has been seen by her PCP recently with addition of medications to try to help stimulate appetite and weight gain. She was brought into the emergency room today by by EMS when her exdaughter-in-law found her to be less responsive than normal. This is at the end of a week which began on Monday with her having spastic type fasciculations first of the legs, and then today prior to her being brought to the emergency room was having similar fasciculations in both arms and the legs. Also this week she has been profoundly weak not able to stand up besides going to the bathroom and back. Having a hard time sitting up without assistance. She has fallen at least 3 or 4 times this week once on her right hip and arm. The daughter is concerned because now she is not feeding herself without arm.  At no time this week has not been any notice of any complaints of chest tightness/pressure or shortness of breath. She has had had any symptoms of PND, orthopnea or edema. No complaints of  palpitations or irregular heartbeats. The weakness has not been related to dizziness or wooziness. No syncope or near syncope, falls have been because of weakness and balance.  Per ER physician report, the patient was brought to the hospital for altered mental status and that fact that today she was not as responsive as she has usually been. Unfortunately the family member who was present during this initial stage is her son's ex-wife, who is not present to get the story. The son was not there nor was either daughter Vickie Young or Vickie Young). All the history is secondhand. When I asked her questions, she clearly denies any chest pain pressure or dyspnea. No coughing or wheezing. No nausea vomiting or diarrhea. The only thing I got from the daughter was the above-mentioned symptoms and poor p.o. intake over the last month or so. She did not take one of the medicines prescribed by the PCP because the daughter felt that it was making her feel drowsy and and lightheaded. She has been taking the mirtazapine.  During the initial evaluation in the ER, the EKG performed on my review showed signs of possible ST elevations in precordial anterior leads with biphasic ST-T wave seconds and T wave inversions with what looked like a bundle branch block pattern. Troponins were checked and came back to be roughly 1600, follow-up EKG was then performed now showing again evolutionary changes of anterior MI. Patient still had no chest pain or pressure. Cardiology was consulted at this  time. Delta troponin somewhat increased up to 1800 from 1600.  Past Medical History:  Diagnosis Date  . Breast cancer in female [facility])    remote  . Closed fracture of left distal radius 12/28/2018  . Closed fracture of left proximal humerus 12/28/2018  . Diabetes mellitus without complication ([place])   . Essential hypertension 12/28/2018  . Glaucoma     Past Surgical History:  Procedure Laterality Date  . herniated disc     remote  . MASTECTOMY      remote  . ORIF HUMERUS FRACTURE Left 12/28/2018   Procedure: OPEN REDUCTION INTERNAL FIXATION (ORIF) PROXIMAL HUMERUS FRACTURE;  Surgeon: Marchia Bond, MD;  Location: [place];  Service: Orthopedics;  Laterality: Left;  . ORIF WRIST FRACTURE Left 12/28/2018   Procedure: OPEN REDUCTION INTERNAL FIXATION (ORIF) WRIST FRACTURE;  Surgeon: Marchia Bond, MD;  Location: [place];  Service: Orthopedics;  Laterality: Left;     Home Medications:  Prior to Admission medications   Medication Sig Start Date End Date Taking? Authorizing Provider  acetaminophen (TYLENOL) 500 MG tablet Take 1 tablet (500 mg total) by mouth every 8 (eight) hours as needed for moderate pain. 01/01/19 01/01/20  Thurnell Lose, MD  amLODipine (NORVASC) 10 MG tablet Take 0.5 tablets (5 mg total) by mouth daily. 01/01/19   Thurnell Lose, MD  aspirin (ASPIRIN EC) 81 MG EC tablet Take 81 mg by mouth daily. Swallow whole.    [provider]  COMBIGAN 0.2-0.5 % ophthalmic solution Place 1 drop into both eyes at bedtime.  11/10/14   [provider]  ferrous sulfate 325 (65 FE) MG tablet Take 1 tablet (325 mg total) by mouth 2 (two) times daily with a meal. 01/01/19   Thurnell Lose, MD  irbesartan (AVAPRO) 75 MG tablet Take 1 tablet (75 mg total) by mouth daily. 01/02/19   Thurnell Lose, MD  metFORMIN (GLUCOPHAGE) 500 MG tablet Take 500 mg by mouth daily.  09/18/14   [provider]  Multiple Vitamin (MULTIVITAMIN) capsule Take 1 capsule by mouth daily.    [provider]  polyethylene glycol (MIRALAX) 17 g packet Take 17 g by mouth daily. 01/01/19   Thurnell Lose, MD  TRADJENTA 5 MG TABS tablet Take 5 mg by mouth daily. 09/05/14   [provider]  Travoprost, BAK Free, (TRAVATAN) 0.004 % SOLN ophthalmic solution Place 1 drop into both eyes at bedtime.    [provider]  Apparently she was started on medicine to help her appetite, PCP started this couple weeks ago, but  daughter is not giving it to her. This is because it was causing her to be drowsy. Unfortunately we do not know the name. She was also just started on mirtazapine 15 mg daily. Not currently on her med list.  Inpatient Medications: None Scheduled Meds:  Continuous Infusions:  PRN Meds:   Allergies:   No Known Allergies  Social History:   Social History   Socioeconomic History  . Marital status: Widowed    Spouse name: Not on file  . Number of children: Not on file  . Years of education: Not on file  . Highest education level: Not on file  Occupational History  . Occupation: retired  Tobacco Use  . Smoking status: Never Smoker  . Smokeless tobacco: Current User  Substance and Sexual Activity  . Alcohol use: No  . Drug use: No  . Sexual activity: Not on file  Other Topics Concern  .  Not on file  Social History Narrative  . Not on file   Social Determinants of Health   Financial Resource Strain:   . Difficulty of Paying Living Expenses:   Food Insecurity:   . Worried About Charity fundraiser in the Last Year:   . Arboriculturist in the Last Year:   Transportation Needs:   . Film/video editor (Medical):   Marland Kitchen Lack of Transportation (Non-Medical):   Physical Activity:   . Days of Exercise per Week:   . Minutes of Exercise per Session:   Stress:   . Feeling of Stress :   Social Connections:   . Frequency of Communication with Friends and Family:   . Frequency of Social Gatherings with Friends and Family:   . Attends Religious Services:   . Active Member of Clubs or Organizations:   . Attends Archivist Meetings:   Marland Kitchen Marital Status:   Intimate Partner Violence:   . Fear of Current or Ex-Partner:   . Emotionally Abused:   Marland Kitchen Physically Abused:   . Sexually Abused:     Family History: At 86 years old, no contributory family history. Her daughter and son were not aware of any significant family history. No family history on file.   ROS: All the  review of symptoms was obtained from the patient's daughter who has intermittently seen her during the week. Please see the history of present illness.  Review of Systems  Unable to perform ROS: Dementia  Constitutional: Positive for malaise/fatigue and weight loss.  HENT: Negative for congestion and nosebleeds.   Respiratory: Negative for cough and shortness of breath.   Cardiovascular: Negative for claudication and leg swelling.  Gastrointestinal: Negative for blood in stool, heartburn and melena.       Poor p.o. intake. Partially because of lack of appetite, change of taste.  Genitourinary: Negative for dysuria and hematuria.  Musculoskeletal: Positive for falls (Recently fell on her right arm and hip. Has not been using that arm out to try to feed herself.).  Neurological: Positive for seizures (May be not seizures, but the daughter noted significant twitching spastic activity of first the legs this past Monday and then today twitching of the arms and legs.) and weakness (Has been unable to hold up her weight today this week. Barely able to sit up in bed without help. Not feeding herself. Has had several falls.). Negative for dizziness (Less dizziness and poor balance.) and focal weakness.  Psychiatric/Behavioral: Positive for memory loss.   All other ROS reviewed and negative.     Physical Exam/Data:   Vitals:   10/11/19 1632 10/11/19 1654 10/11/19 1745 10/11/19 1814  BP: 117/63 110/87 (!) 115/57 (!) 130/55  Pulse: 82 80 79 82  Resp: (!) 21 13 19 11   Temp:      TempSrc:      SpO2: 98% 99% 98% 100%  Weight:      Height:        Intake/Output Summary (Last 24 hours) at 10/11/2019 1924 Last data filed at 10/11/2019 1620 Gross per 24 hour  Intake 1000 ml  Output --  Net 1000 ml   Last 3 Weights 10/11/2019 12/28/2018  Weight (lbs) 192 lb 7.4 oz 192 lb 7.4 oz  Weight (kg) 87.3 kg 87.3 kg     Body mass index is 26.1 kg/m.  General: Somewhat thin and frail elderly woman, in no  acute distress. Resting comfortably. HEENT: Normocephalic, atraumatic. Lymph: no adenopathy Neck: no  JVD with soft carotid bruit. Endocrine:  No thryomegaly Vascular: Mildly diminished pedal pulses but palpable. Feet are warm. Cardiac: Distant heart sounds, normal S1-S2. RRR with no M/R/G. Could be have split S2. Lungs: Did not cooperate well, but does sound to be CTA B, nonlabored. No W/R/R. Abd: soft, nontender, no hepatomegaly  Ext: no edema Musculoskeletal:  No deformities, BUE and BLE strength normal and equal Skin: warm and dry  Neuro:  CNs 2-12 intact, no focal abnormalities noted: Very hard of hearing. Difficult to fully assess. Did not cooperate with following fingers etc. Psych: Pleasant mood and affect, but very confused. Not good historian unable to provide any history besides ramblings.  EKG:  The EKG x 2 personally reviewed and demonstrates:   1.  NSR - 74 BPM, LBBB, PAC - Anterior STE most notable in V2-V3 (less notable in V1 & V4). No reciprocal changes. Subtle changes in V5 with subtle T wave inversions in inferior limb leads. -> EKG does not have "STEMI on it, was read as deep inverted T waves in V2 and V3. 2.   1653 hrs NSR-81 bpm. PACs. Left axis deviation (-52). Questionable LVH with repolarization -> nonspecific IVCD in the left bundle pattern, (incomplete left bundle branch block) with persistent ST elevations in V1 through V3 with Q waves now and T wave inversions consistent with evolutionary changes of anterior MI. EKG now read as acute infarct --> true reading should be anterior MI, recent.  Telemetry:  Telemetry was personally reviewed and demonstrates: Sinus rhythm, rates in 70s and 80s.  Relevant CV Studies: None  Laboratory Data:  High Sensitivity Troponin:   Recent Labs  Lab 10/11/19 1513 10/11/19 1721  TROPONINIHS 1,604* 1,830*     Chemistry Recent Labs  Lab 10/11/19 1513 10/11/19 1519  NA 135 137  K 3.5 3.4*  CL 100  --   CO2 21*  --     GLUCOSE 225*  --   BUN 32*  --   CREATININE 1.25*  --   CALCIUM 8.9  --   GFRNONAA 37*  --   GFRAA 43*  --   ANIONGAP 14  --     Recent Labs  Lab 10/11/19 1513  PROT 7.7  ALBUMIN 3.3*  AST 39  ALT 26  ALKPHOS 61  BILITOT 0.9   Hematology Recent Labs  Lab 10/11/19 1513 10/11/19 1519  WBC 11.8*  --   RBC 3.22*  --   HGB 9.1* 10.2*  HCT 30.1* 30.0*  MCV 93.5  --   MCH 28.3  --   MCHC 30.2  --   RDW 12.2  --   PLT 233  --    BNPNo results for input(s): BNP, PROBNP in the last 168 hours.  DDimer No results for input(s): DDIMER in the last 168 hours.   Radiology/Studies:  DG Chest 2 View  Result Date: 10/11/2019 CLINICAL DATA:  86 year old female with history of altered level of consciousness. History of trauma from a fall. Pain. EXAM: CHEST - 2 VIEW COMPARISON:  Chest x-ray 08/06/2019. FINDINGS: Lung volumes are normal. No consolidative airspace disease. No pleural effusions. No pneumothorax. No pulmonary nodule or mass noted. Pulmonary vasculature and the cardiomediastinal silhouette are within normal limits. Atherosclerotic calcifications in the thoracic aorta. Status post left shoulder ORIF. IMPRESSION: 1. No radiographic evidence of acute cardiopulmonary disease. 2. Aortic atherosclerosis. Electronically Signed   By: Vinnie Langton M.D.   On: 10/11/2019 14:54   DG Shoulder Right  Result Date: 10/11/2019  CLINICAL DATA:  Fall, right shoulder pain EXAM: RIGHT SHOULDER - 2+ VIEW COMPARISON:  None. FINDINGS: There is no evidence of fracture or dislocation. Mild arthropathy of the glenohumeral and acromioclavicular joints. Soft tissues are unremarkable. IMPRESSION: No acute osseous abnormality. Mild arthropathy of the right shoulder. Electronically Signed   By: Davina Poke D.O.   On: 10/11/2019 14:54   CT HEAD WO CONTRAST  Result Date: 10/11/2019 CLINICAL DATA:  Altered mental status EXAM: CT HEAD WITHOUT CONTRAST TECHNIQUE: Contiguous axial images were obtained from  the base of the skull through the vertex without intravenous contrast. COMPARISON:  04/26/2019 FINDINGS: Brain: There is no acute intracranial hemorrhage, mass effect, or edema. No new loss of gray differentiation. There is no extra-axial fluid collection. Patchy confluent areas of hypoattenuation in the supratentorial white matter are nonspecific but probably reflect stable chronic microvascular ischemic changes. Small chronic cortical infarct of the left precentral gyrus. Prominence of the ventricles and sulci reflects stable parenchymal volume loss. Vascular: There is atherosclerotic calcification at the skull base. Skull: Calvarium is unremarkable. Sinuses/Orbits: No acute finding. Other: None. IMPRESSION: No acute intracranial hemorrhage or evidence of acute infarction. Chronic findings detailed above. Electronically Signed   By: Macy Mis M.D.   On: 10/11/2019 18:30   DG Shoulder Left  Result Date: 10/11/2019 CLINICAL DATA:  Left shoulder pain after fall EXAM: LEFT SHOULDER - 2+ VIEW COMPARISON:  12/29/2018 FINDINGS: Prior ORIF of the proximal left humerus via a lateral sideplate and screw fixation construct. Hardware appears intact and unchanged in alignment. There is bridging callus formation over the fracture site. No definite perihardware fracture. Mild arthropathy of the AC in glenohumeral joints. Soft tissues within normal limits. IMPRESSION: Prior ORIF of the proximal left humerus without evidence of hardware complication. No acute fracture or dislocation. Electronically Signed   By: Davina Poke D.O.   On: 10/11/2019 14:57       TIMI Risk Score for ST  Elevation MI:   The patient's TIMI risk score is 6, which indicates a 16.1% risk of all cause mortality at 30 days.     Assessment and Plan:   Principal Problem:   Altered mental status Active Problems:   Dementia due to Alzheimer's disease ([place])   Silent ST elevation myocardial infarction (STEMI) involving left anterior  descending (LAD) coronary artery of anterior wall (HCC)   Diabetes mellitus without complication ([place])   Essential hypertension   Failure to thrive in adult  Very difficult situation. I spent over 45 minutes on telephone with the patient's daughter to try to get this history. I then spent 25 minutes spent with the patient on examination and then in chart review.  Surreptitious finding of anterior ST elevations on EKG now with elevated troponin in a patient with no active chest pain or pressure to suggest angina or heart failure symptoms of PND, orthopnea or edema. She has no prior cardiac history.  Only intervening symptoms have been concerning symptoms of poor p.o. intake/poor nutrition/failure to thrive and generalized weakness with altered mental status. It is possible that she has had an infarct within the last 24 to 48hours leading to this progression of symptoms today. Difficult to tell when this occurred. Most likely a silent event.  At this point as she is not having symptoms, and there clearly is evolutionary changes suggesting that this is not a recent infarct. Would not offer urgent or emergent cardiac catheterization. In fact would recommend medical management only. Recommend internal medicine admission with cardiology  consultation. I discussed with EDP.   I think that the most appropriate course of action will be medical management with 48 to 72 hours of IV heparin, addition of rosuvastatin at moderate dose of maybe 20 mg (would not do higher doses to avoid worsening memory issues).   She does have blood pressure room to add Lopressor or carvedilol-we will start carvedilol 3.125 mg twice daily.  Check 2D echocardiogram  I am somewhat leery of aspirin and Plavix based on her recent falls. Would not be unreasonable to do Plavix alone.   Blood pressure somewhat elevated, however she has not had her home medications today. -> Would add carvedilol and hold amlodipine until we see  pressure response to beta-blocker.  Defer diabetes management to primary team  Failure to thrive-has not had any benefit from what sounds like was probably Megace or Remeron. Not sure how much they will add as an inpatient. --> Perhaps consider nutrition counseling, however I think this may simply be signs of advancing dementia. I did explain this to the daughter that nutrition issues and p.o. intake seem to be common features in progression of dementia.  Need to address CODE STATUS--see below, would need to have a telephone conversation with Malachy Mood (her daughter/POA)   The ER RN has both daughters numbers: I spent 45 minutes on the phone with Vickie Young who is a second youngest daughter: Phone [phone]; the POA daughter is Malachy Mood, phone [phone] (she was not available to talk to, but would be the one to answer questions about CODE STATUS)      For questions or updates, please contact [facility] Please consult [email or web address] for contact info under    Signed, Glenetta Hew, MD  10/11/2019 7:24 PM

## 2019-10-11 NOTE — ED Notes (Addendum)
Daughter [name]) on speaker phone with cardiology at bedside; pt's son at bedside

## 2019-10-11 NOTE — Plan of Care (Signed)

## 2019-10-12 ENCOUNTER — Other Ambulatory Visit (HOSPITAL_COMMUNITY): Payer: Medicare Other

## 2019-10-12 ENCOUNTER — Inpatient Hospital Stay (HOSPITAL_COMMUNITY): Payer: Medicare Other

## 2019-10-12 LAB — CBC
HCT: 27.9 % — ABNORMAL LOW (ref 36.0–46.0)
Hemoglobin: 8.8 g/dL — ABNORMAL LOW (ref 12.0–15.0)
MCH: 27.8 pg (ref 26.0–34.0)
MCHC: 31.5 g/dL (ref 30.0–36.0)
MCV: 88 fL (ref 80.0–100.0)
Platelets: 278 10*3/uL (ref 150–400)
RBC: 3.17 MIL/uL — ABNORMAL LOW (ref 3.87–5.11)
RDW: 12.2 % (ref 11.5–15.5)
WBC: 8.3 10*3/uL (ref 4.0–10.5)
nRBC: 0 % (ref 0.0–0.2)

## 2019-10-12 LAB — GLUCOSE, CAPILLARY
Glucose-Capillary: 126 mg/dL — ABNORMAL HIGH (ref 70–99)
Glucose-Capillary: 147 mg/dL — ABNORMAL HIGH (ref 70–99)

## 2019-10-12 LAB — HEPARIN LEVEL (UNFRACTIONATED)
Heparin Unfractionated: 0.89 IU/mL — ABNORMAL HIGH (ref 0.30–0.70)
Heparin Unfractionated: 0.85 IU/mL — ABNORMAL HIGH (ref 0.30–0.70)

## 2019-10-12 LAB — TROPONIN I (HIGH SENSITIVITY)
Troponin I (High Sensitivity): 989 ng/L (ref <18)
Troponin I (High Sensitivity): 1275 ng/L (ref <18)

## 2019-10-12 LAB — HEMOGLOBIN A1C
Hgb A1c MFr Bld: 7.2 % — ABNORMAL HIGH (ref 4.8–5.6)
Mean Plasma Glucose: 159.94 mg/dL

## 2019-10-12 MED ORDER — BRIMONIDINE TARTRATE 0.2 % OP SOLN
1.0000 [drp] | Freq: Every day | OPHTHALMIC | Status: DC
  Administered 2019-10-12: 1 [drp] via OPHTHALMIC
  Filled 2019-10-12: qty 5

## 2019-10-12 MED ORDER — BRIMONIDINE TARTRATE-TIMOLOL 0.2-0.5 % OP SOLN: 1.0000 [drp] | Freq: Every day | OPHTHALMIC | Status: DC

## 2019-10-12 MED ORDER — POLYETHYLENE GLYCOL 3350 17 G PO PACK
17.0000 g | PACK | Freq: Every day | ORAL | Status: DC
  Administered 2019-10-12 – 2019-10-13 (×2): 17 g via ORAL
  Filled 2019-10-12: qty 1

## 2019-10-12 MED ORDER — TIMOLOL MALEATE 0.5 % OP SOLN
1.0000 [drp] | Freq: Every day | OPHTHALMIC | Status: DC
  Administered 2019-10-12: 1 [drp] via OPHTHALMIC
  Filled 2019-10-12: qty 5

## 2019-10-12 MED ORDER — MIRTAZAPINE 15 MG PO TBDP
15.0000 mg | ORAL_TABLET | Freq: Every day | ORAL | Status: DC
  Administered 2019-10-12: 15 mg via ORAL
  Filled 2019-10-12 (×2): qty 1

## 2019-10-12 MED ORDER — FERROUS SULFATE 325 (65 FE) MG PO TABS
325.0000 mg | ORAL_TABLET | Freq: Two times a day (BID) | ORAL | Status: DC
  Administered 2019-10-12 – 2019-10-13 (×2): 325 mg via ORAL
  Filled 2019-10-12 (×3): qty 1

## 2019-10-12 NOTE — Progress Notes (Signed)
Occupational Therapy Evaluation Patient Details Name: Vickie Young MRN: [id number] DOB: [birth year] Today's Date: 10/12/2019    History of Present Illness 86 y.o. female with medical history significant of DM2, HTN, Alzheimer's dementia,  falls. Pt in to ED with concerns for poor appetite, decreased responsiveness. Pt with STEMI, FTT.    Clinical Impression   PTA, pt living at home and per family was mobilizing with cane/furniture walking at baseline. Family assisted with bath however pt was ablet o dress and feed herself. Daughter states pt has had "4 falls in 48 hrs". Pt currently requires Mod A for mobility and ADL tasks. Recommend rehab at SNF however family plans to take pt home. If DC plan is home, pt will need DIRECT hands on assistance for all mobility and ADL given her high risk for falls. Daughter verbalized understanding. Will follow acutely to facilitate safe DC home.     Follow Up Recommendations  Supervision/Assistance - 24 hour;Home health OT (family declining SNF; needs direct hands on assistance for a)    Equipment Recommendations  Other (comment) (RW); wheelchair   Recommendations for Other Services PT consult     Precautions / Restrictions Precautions Precautions: Fall      Mobility Bed Mobility Overal bed mobility: Needs Assistance Bed Mobility: Supine to Sit     Supine to sit: Mod assist     General bed mobility comments: daughter reports she has to have help getting in/out of bed at baseline  May benefit from use of bedrail at home  Transfers Overall transfer level: Needs assistance Equipment used: Rolling walker (2 wheeled) Transfers: Sit to/from [facility] Sit to Stand: Min assist Stand pivot transfers: Mod assist            Balance Overall balance assessment: Needs assistance   Sitting balance-Leahy Scale: Fair       Standing balance-Leahy Scale: Poor                             ADL either performed  or assessed with clinical judgement   ADL Overall ADL's : Needs assistance/impaired Eating/Feeding: Minimal assistance Eating/Feeding Details (indicate cue type and reason): poor PO intake Grooming: Minimal assistance;Sitting   Upper Body Bathing: Minimal assistance;Sitting   Lower Body Bathing: Moderate assistance;Sit to/from stand   Upper Body Dressing : Minimal assistance;Sitting   Lower Body Dressing: Moderate assistance;Sit to/from stand   Toilet Transfer: Moderate assistance;Stand-pivot   Toileting- Clothing Manipulation and Hygiene: Moderate assistance;Sit to/from stand       Functional mobility during ADLs: Moderate assistance;Cueing for safety;Cueing for sequencing General ADL Comments: Attempted to use a RW for mobility; pt unsteady and required multimodal cues however feel she was safer with RW than with HHA. Demonstrated increased L Lateral Lean with HHA     Vision  will further assess       Perception     Praxis      Pertinent Vitals/Pain Pain Assessment: Faces Faces Pain Scale: Hurts a little bit Pain Location: generalized ; LUE Pain Descriptors / Indicators: Discomfort Pain Intervention(s): Limited activity within patient's tolerance     Hand Dominance Right   Extremity/Trunk Assessment Upper Extremity Assessment Upper Extremity Assessment: Generalized weakness (hx of L shoulder/wrist fx 12/2018)   Lower Extremity Assessment Lower Extremity Assessment: Defer to PT evaluation   Cervical / Trunk Assessment Cervical / Trunk Assessment: Kyphotic   Communication     Cognition Arousal/Alertness: Awake/alert Behavior During Therapy: Impulsive  Overall Cognitive Status: History of cognitive impairments - at baseline                                 General Comments: per daughter pt is at baseline   General Comments  daughter present and plans for her Mom to come home to stay with her    Exercises     Shoulder Instructions       Home Living Family/patient expects to be discharged to:: Private residence Living Arrangements: Children Available Help at Discharge: Family;Available 24 hours/day Type of Home: House Home Access: Stairs to enter [employer] of Steps: 1   Home Layout: One level     Bathroom Shower/Tub: Occupational psychologist: Standard Bathroom Accessibility: Yes How Accessible: Accessible via walker Home Equipment: Ezel - single point;Bedside commode          Prior Functioning/Environment Level of Independence: Independent with assistive device(s);Needs assistance  Gait / Transfers Assistance Needed: used cane' furniture walked ADL's / Homemaking Assistance Needed: Family assisting with bathing; pt ablet o dress and feed herself   Comments: Pt uses a cane and "furniture walks" per family; family        OT Problem List: Decreased strength;Decreased activity tolerance;Impaired balance (sitting and/or standing);Decreased cognition;Decreased coordination;Decreased safety awareness;Decreased knowledge of use of DME or AE;Cardiopulmonary status limiting activity;Pain      OT Treatment/Interventions: Self-care/ADL training;Therapeutic exercise;Energy conservation;DME and/or AE instruction;Therapeutic activities;Cognitive remediation/compensation;Patient/family education;Balance training    OT Goals(Current goals can be found in the care plan section) Acute Rehab OT Goals Patient Stated Goal: family is for pt to return home OT Goal Formulation: With patient/family Time For Goal Achievement: 10/26/19 Potential to Achieve Goals: Good  OT Frequency: Min 2X/week   Barriers to D/C:            Co-evaluation              AM-PAC OT "6 Clicks" Daily Activity     Outcome Measure Help from another person eating meals?: A Little Help from another person taking care of personal grooming?: A Little Help from another person toileting, which includes using toliet, bedpan,  or urinal?: A Lot Help from another person bathing (including washing, rinsing, drying)?: A Lot Help from another person to put on and taking off regular upper body clothing?: A Little Help from another person to put on and taking off regular lower body clothing?: A Lot 6 Click Score: 15   End of Session Equipment Utilized During Treatment: Gait belt;Rolling walker Nurse Communication: Mobility status  Activity Tolerance: Patient tolerated treatment well Patient left: in chair;with call bell/phone within reach;with chair alarm set  OT Visit Diagnosis: Unsteadiness on feet (R26.81);Other abnormalities of gait and mobility (R26.89);Repeated falls (R29.6);Muscle weakness (generalized) (M62.81);Other symptoms and signs involving cognitive function;Pain;Adult, failure to thrive (R62.7) Pain - Right/Left: Left Pain - part of body: Shoulder (generalized)                Time: 4656-8127 OT Time Calculation (min): 29 min Charges:  OT General Charges $OT Visit: 1 Visit OT Evaluation $OT Eval Moderate Complexity: 1 Mod OT Treatments $Self Care/Home Management : 8-22 mins  Maurie Boettcher, OT/L   Acute OT Clinical Specialist [place] Pager [phone] Office [phone]   [facility] 10/12/2019, 4:09 PM

## 2019-10-12 NOTE — Progress Notes (Signed)
[place] for heparin x48 hr  Indication: chest pain/ACS   No Known Allergies  Patient Measurements: Height: 6' (182.9 cm) Weight: 65.8 kg (145 lb 1.6 oz) IBW/kg (Calculated) : 73.1 Heparin Dosing Weight: 87 kg  Vital Signs: Temp: 97.6 F (36.4 C) (06/26 1700) Temp Source: Oral (06/26 1700) BP: 112/60 (06/26 1700) Pulse Rate: 72 (06/26 1700)  Labs: Recent Labs    10/11/19 1513 10/11/19 1513 10/11/19 1519 10/11/19 1721 10/11/19 1925 10/11/19 2355 10/12/19 0026 10/12/19 0736 10/12/19 1734  HGB 9.1*   < > 10.2*  --   --   --   --  8.8*  --   HCT 30.1*  --  30.0*  --   --   --   --  27.9*  --   PLT 233  --   --   --   --   --   --  278  --   HEPARINUNFRC  --   --   --   --   --   --   --  0.89* 0.85*  CREATININE 1.25*  --   --   --   --   --   --   --   --   TROPONINIHS 1,604*  --   --    < > 1,636* 989* 1,275*  --   --    < > = values in this interval not displayed.    Estimated Creatinine Clearance: 28.6 mL/min (A) (by C-G formula based on SCr of 1.25 mg/dL (H)).   Assessment: 86 yo F starting on heparin for STEMI for 48 hours only per MD consult. Patient with high risk of falls with 3-4 falls this week.  -CBC stable, no bleeding noted  - spoke to RN heparin drip 950 uts/hr HL remains > goal at 0.85   Goal of Therapy:  Heparin level 0.3-0.7 units/ml Monitor platelets by anticoagulation protocol: Yes   Plan:  Decrease heparin to 700 units/hr, order to end in 48 hours (ends 6/27 ~ 8pm) Monitor daily HL, CBC   Bonnita Nasuti Pharm.D. CPP, BCPS Clinical Pharmacist [phone] 10/12/2019 6:28 PM    **Pharmacist phone directory can now be found on [email or web address] ([email or web address]).  Listed under [place].

## 2019-10-12 NOTE — Progress Notes (Signed)
[place] for heparin x48 hr  Indication: chest pain/ACS   No Known Allergies  Patient Measurements: Height: 6' (182.9 cm) Weight: 65.8 kg (145 lb 1.6 oz) IBW/kg (Calculated) : 73.1 Heparin Dosing Weight: 87 kg  Vital Signs: Temp: 97.7 F (36.5 C) (06/26 0726) Temp Source: Oral (06/26 0726) BP: 110/49 (06/26 0726) Pulse Rate: 71 (06/26 0726)  Labs: Recent Labs    10/11/19 1513 10/11/19 1513 10/11/19 1519 10/11/19 1721 10/11/19 1925 10/11/19 2355 10/12/19 0026 10/12/19 0736  HGB 9.1*   < > 10.2*  --   --   --   --  8.8*  HCT 30.1*  --  30.0*  --   --   --   --  27.9*  PLT 233  --   --   --   --   --   --  278  HEPARINUNFRC  --   --   --   --   --   --   --  0.89*  CREATININE 1.25*  --   --   --   --   --   --   --   TROPONINIHS 1,604*  --   --    < > 1,636* 989* 1,275*  --    < > = values in this interval not displayed.    Estimated Creatinine Clearance: 28.6 mL/min (A) (by C-G formula based on SCr of 1.25 mg/dL (H)).   Assessment: 86 yo F starting on heparin for STEMI for 48 hours only per MD consult. Patient with high risk of falls with 3-4 falls this week.  -heparin level above goal, CBC stable   Goal of Therapy:  Heparin level 0.3-0.7 units/ml Monitor platelets by anticoagulation protocol: Yes   Plan:  Decrease heparin to 950 units/hr, order to end in 48 hours (ends 6/27 ~ 8pm) F/u 8hr HL Monitor daily HL, CBC  Hildred Laser, PharmD Clinical Pharmacist **Pharmacist phone directory can now be found on [email or web address] ([email or web address]).  Listed under [place].

## 2019-10-12 NOTE — Plan of Care (Signed)
  Problem: Education: Goal: Knowledge of General Education information will improve Description Including pain rating scale, medication(s)/side effects and non-pharmacologic comfort measures Outcome: Progressing   Problem: Health Behavior/Discharge Planning: Goal: Ability to manage health-related needs will improve Outcome: Progressing   

## 2019-10-12 NOTE — Progress Notes (Signed)
Pt relative was in room. She refused for patient to be stuck for cbg reading.

## 2019-10-12 NOTE — Progress Notes (Signed)
This pt is confused to time, date and situation.  The pt's daughter refused CBG checks on this pt.  MD made aware.  Pt in no distress and appears stable. Will continue to monitor pt

## 2019-10-12 NOTE — Progress Notes (Signed)
Provider updated on troponins.

## 2019-10-12 NOTE — Progress Notes (Signed)
Echo attempted twice, once with physical therapy, then family wanted patient to remain in chair. We will attempt in the AM.

## 2019-10-12 NOTE — Progress Notes (Signed)
Progress Note  Patient Name: Vickie Young Date of Encounter: 10/12/2019  [facility] Cardiologist: Reola Calkins Ellyn Hack)  Subjective   No angina or dyspnea. Able to lie flat.  Inpatient Medications    Scheduled Meds:  carvedilol  3.125 mg Oral BID WC   clopidogrel  75 mg Oral Daily   insulin aspart  0-15 Units Subcutaneous TID WC   insulin aspart  0-5 Units Subcutaneous QHS   rosuvastatin  20 mg Oral Daily   Continuous Infusions:  heparin 950 Units/hr (10/12/19 1000)   PRN Meds: acetaminophen, nitroGLYCERIN, ondansetron (ZOFRAN) IV   Vital Signs    Vitals:   10/12/19 0509 10/12/19 0600 10/12/19 0726 10/12/19 1203  BP: (!) 107/50  (!) 110/49 (!) 111/58  Pulse: 73  71 75  Resp: 20  16 16   Temp: 98.6 F (37 C)  97.7 F (36.5 C) 97.8 F (36.6 C)  TempSrc: Oral  Oral Oral  SpO2: 97%  95% 92%  Weight:  65.8 kg    Height:        Intake/Output Summary (Last 24 hours) at 10/12/2019 1230 Last data filed at 10/12/2019 0305 Gross per 24 hour  Intake 1315.18 ml  Output --  Net 1315.18 ml   Last 3 Weights 10/12/2019 10/11/2019 10/11/2019  Weight (lbs) 145 lb 1.6 oz 141 lb 192 lb 7.4 oz  Weight (kg) 65.817 kg 63.957 kg 87.3 kg      Telemetry    NSR - Personally Reviewed  ECG    NSR, Q waves an ST elevation in anteroseptal leads - Personally Reviewed  Physical Exam  Comfortable lying flat GEN: No acute distress.   Neck: No JVD Cardiac: RRR, no murmurs, rubs, or gallops.  Respiratory: Clear to auscultation bilaterally. GI: Soft, nontender, non-distended  MS: No edema; No deformity. Neuro:  Nonfocal  Psych: Normal affect   Labs    High Sensitivity Troponin:   Recent Labs  Lab 10/11/19 1513 10/11/19 1721 10/11/19 1925 10/11/19 2355 10/12/19 0026  TROPONINIHS 1,604* 1,830* 1,636* 989* 1,275*      Chemistry Recent Labs  Lab 10/11/19 1513 10/11/19 1519  NA 135 137  K 3.5 3.4*  CL 100  --   CO2 21*  --   GLUCOSE 225*  --   BUN 32*  --    CREATININE 1.25*  --   CALCIUM 8.9  --   PROT 7.7  --   ALBUMIN 3.3*  --   AST 39  --   ALT 26  --   ALKPHOS 61  --   BILITOT 0.9  --   GFRNONAA 37*  --   GFRAA 43*  --   ANIONGAP 14  --      Hematology Recent Labs  Lab 10/11/19 1513 10/11/19 1519 10/12/19 0736  WBC 11.8*  --  8.3  RBC 3.22*  --  3.17*  HGB 9.1* 10.2* 8.8*  HCT 30.1* 30.0* 27.9*  MCV 93.5  --  88.0  MCH 28.3  --  27.8  MCHC 30.2  --  31.5  RDW 12.2  --  12.2  PLT 233  --  278    BNPNo results for input(s): BNP, PROBNP in the last 168 hours.   DDimer No results for input(s): DDIMER in the last 168 hours.   Radiology    DG Chest 2 View  Result Date: 10/11/2019 CLINICAL DATA:  86 year old female with history of altered level of consciousness. History of trauma from a fall. Pain. EXAM: CHEST -  2 VIEW COMPARISON:  Chest x-ray 08/06/2019. FINDINGS: Lung volumes are normal. No consolidative airspace disease. No pleural effusions. No pneumothorax. No pulmonary nodule or mass noted. Pulmonary vasculature and the cardiomediastinal silhouette are within normal limits. Atherosclerotic calcifications in the thoracic aorta. Status post left shoulder ORIF. IMPRESSION: 1. No radiographic evidence of acute cardiopulmonary disease. 2. Aortic atherosclerosis. Electronically Signed   By: Vinnie Langton M.D.   On: 10/11/2019 14:54   DG Shoulder Right  Result Date: 10/11/2019 CLINICAL DATA:  Fall, right shoulder pain EXAM: RIGHT SHOULDER - 2+ VIEW COMPARISON:  None. FINDINGS: There is no evidence of fracture or dislocation. Mild arthropathy of the glenohumeral and acromioclavicular joints. Soft tissues are unremarkable. IMPRESSION: No acute osseous abnormality. Mild arthropathy of the right shoulder. Electronically Signed   By: Davina Poke D.O.   On: 10/11/2019 14:54   CT HEAD WO CONTRAST  Result Date: 10/11/2019 CLINICAL DATA:  Altered mental status EXAM: CT HEAD WITHOUT CONTRAST TECHNIQUE: Contiguous axial images  were obtained from the base of the skull through the vertex without intravenous contrast. COMPARISON:  04/26/2019 FINDINGS: Brain: There is no acute intracranial hemorrhage, mass effect, or edema. No new loss of gray differentiation. There is no extra-axial fluid collection. Patchy confluent areas of hypoattenuation in the supratentorial white matter are nonspecific but probably reflect stable chronic microvascular ischemic changes. Small chronic cortical infarct of the left precentral gyrus. Prominence of the ventricles and sulci reflects stable parenchymal volume loss. Vascular: There is atherosclerotic calcification at the skull base. Skull: Calvarium is unremarkable. Sinuses/Orbits: No acute finding. Other: None. IMPRESSION: No acute intracranial hemorrhage or evidence of acute infarction. Chronic findings detailed above. Electronically Signed   By: Macy Mis M.D.   On: 10/11/2019 18:30   DG Shoulder Left  Result Date: 10/11/2019 CLINICAL DATA:  Left shoulder pain after fall EXAM: LEFT SHOULDER - 2+ VIEW COMPARISON:  12/29/2018 FINDINGS: Prior ORIF of the proximal left humerus via a lateral sideplate and screw fixation construct. Hardware appears intact and unchanged in alignment. There is bridging callus formation over the fracture site. No definite perihardware fracture. Mild arthropathy of the AC in glenohumeral joints. Soft tissues within normal limits. IMPRESSION: Prior ORIF of the proximal left humerus without evidence of hardware complication. No acute fracture or dislocation. Electronically Signed   By: Davina Poke D.O.   On: 10/11/2019 14:57    Cardiac Studies   pending  Patient Profile     86 y.o. female  With dementia and DM2 with delayed presentation of anterior STEMI, so far without CHF or arrhythmia.  Assessment & Plan    In the absence of post-infarction angina, no benefit for invasive evaluation or PCI. Continue IV heparin through tomorrow. Clopidogrel. Carvedilol has  been started. Echo pending. Tailor medical Rx to LVEF     For questions or updates, please contact [place] Please consult [email or web address] for contact info under        Signed, Sanda Klein, MD  10/12/2019, 12:30 PM

## 2019-10-12 NOTE — Progress Notes (Signed)
PROGRESS NOTE    Vickie ESPEY  EGB:[id number] DOB: May 04, 1923 DOA: 10/11/2019 PCP: [name], MD   Brief Narrative:  HPI: Vickie Young is a 86 y.o. female with medical history significant of DM2, HTN, falls.  Pt in to ED with concerns for poor appetite, decreased responsiveness.  This is at the end of a week which began on Monday with her having spastic type fasciculations first of the legs, and then today prior to her being brought to the emergency room was having similar fasciculations in both arms and the legs. Also this week she has been profoundly weak not able to stand up besides going to the bathroom and back. Having a hard time sitting up without assistance. She has fallen at least 3 or 4 times this week once on her right hip and arm. The daughter is concerned because now she is not feeding herself with that arm.  At no time this week has she complained of CP, SOB, orthopnea, edema.  She continues to deny any symptoms here in the ED.   ED Course: In the ED the work up is surprisngly remarkable for an apparent anterior wall STEMI with trops of 1600 and 1800 on repeat!  Pt continues to deny all symptoms, resting comfortably flat on her back.  BP 130/55.  Cards consulted: See Dr. Allison Quarry note.  They plan on medical management and no invasive cath.  Hospitalist asked to admit.  Assessment & Plan:   Principal Problem:   Silent ST elevation myocardial infarction (STEMI) involving left anterior descending (LAD) coronary artery of anterior wall (HCC) Active Problems:   Diabetes mellitus without complication (HCC)   Essential hypertension   Altered mental status   Dementia due to Alzheimer's disease ([place])   Failure to thrive in adult  Silent anterior wall STEMI: Cardiology on board.  No plan for intervention since she remains asymptomatic.  Continue heparin drip per cardiology recommendation until tomorrow.  She is on aspirin, statin and beta-blocker.  Echo  pending.  Essential hypertension: She takes amlodipine, irbesartan and Hyzaar at home.  Blood pressure remains on low normal side.  She is on carvedilol only here.  Home medications are on hold for now.  Type 2 diabetes mellitus: Continue SSI.  Blood sugar controlled.  Alzheimer's dementia: Not on any medication.  She is alert and oriented to self and place but not to time.  DVT prophylaxis: Heparin drip   Code Status: DNR  Family Communication:  None present at bedside.   Status is: Inpatient  Remains inpatient appropriate because:IV treatments appropriate due to intensity of illness or inability to take PO   Dispo: The patient is from: Home              Anticipated d/c is to: Home              Anticipated d/c date is: 2 days              Patient currently is not medically stable to d/c.        Estimated body mass index is 19.68 kg/m as calculated from the following:   Height as of this encounter: 6' (1.829 m).   Weight as of this encounter: 65.8 kg.      Nutritional status:               Consultants:   Cardiology  Procedures:   None  Antimicrobials:  Anti-infectives (From admission, onward)   None  Subjective: Patient seen and examined earlier.  No family member present.  Patient had no complaint patient denied any chest pain or shortness of breath.  She was alert and oriented to self and place but not time.  Objective: Vitals:   10/12/19 0509 10/12/19 0600 10/12/19 0726 10/12/19 1203  BP: (!) 107/50  (!) 110/49 (!) 111/58  Pulse: 73  71 75  Resp: 20  16 16   Temp: 98.6 F (37 C)  97.7 F (36.5 C) 97.8 F (36.6 C)  TempSrc: Oral  Oral Oral  SpO2: 97%  95% 92%  Weight:  65.8 kg    Height:        Intake/Output Summary (Last 24 hours) at 10/12/2019 1325 Last data filed at 10/12/2019 0305 Gross per 24 hour  Intake 1315.18 ml  Output --  Net 1315.18 ml   Filed Weights   10/11/19 1354 10/11/19 2307 10/12/19 0600  Weight: 87.3  kg 64 kg 65.8 kg    Examination:  General exam: Appears calm and comfortable  Respiratory system: Clear to auscultation. Respiratory effort normal. Cardiovascular system: S1 & S2 heard, RRR. No JVD, murmurs, rubs, gallops or clicks. No pedal edema. Gastrointestinal system: Abdomen is nondistended, soft and nontender. No organomegaly or masses felt. Normal bowel sounds heard. Central nervous system: Alert and oriented. No focal neurological deficits. Extremities: Symmetric 5 x 5 power. Skin: No rashes, lesions or ulcers Psychiatry: Judgement and insight appear poor. Mood & affect appropriate.    Data Reviewed: I have personally reviewed following labs and imaging studies  CBC: Recent Labs  Lab 10/11/19 1513 10/11/19 1519 10/12/19 0736  WBC 11.8*  --  8.3  NEUTROABS 9.2*  --   --   HGB 9.1* 10.2* 8.8*  HCT 30.1* 30.0* 27.9*  MCV 93.5  --  88.0  PLT 233  --  295   Basic Metabolic Panel: Recent Labs  Lab 10/11/19 1513 10/11/19 1519  NA 135 137  K 3.5 3.4*  CL 100  --   CO2 21*  --   GLUCOSE 225*  --   BUN 32*  --   CREATININE 1.25*  --   CALCIUM 8.9  --    GFR: Estimated Creatinine Clearance: 28.6 mL/min (A) (by C-G formula based on SCr of 1.25 mg/dL (H)). Liver Function Tests: Recent Labs  Lab 10/11/19 1513  AST 39  ALT 26  ALKPHOS 61  BILITOT 0.9  PROT 7.7  ALBUMIN 3.3*   No results for input(s): LIPASE, AMYLASE in the last 168 hours. Recent Labs  Lab 10/11/19 1513  AMMONIA 10   Coagulation Profile: No results for input(s): INR, PROTIME in the last 168 hours. Cardiac Enzymes: No results for input(s): CKTOTAL, CKMB, CKMBINDEX, TROPONINI in the last 168 hours. BNP (last 3 results) No results for input(s): PROBNP in the last 8760 hours. HbA1C: Recent Labs    10/11/19 2355  HGBA1C 7.2*   CBG: Recent Labs  Lab 10/11/19 1512 10/11/19 2213 10/12/19 0628  GLUCAP 225* 142* 126*   Lipid Profile: Recent Labs    10/11/19 1927  CHOL 145  HDL  33*  LDLCALC 86  TRIG 131  CHOLHDL 4.4   Thyroid Function Tests: Recent Labs    10/11/19 1513  TSH 1.160   Anemia Panel: No results for input(s): VITAMINB12, FOLATE, FERRITIN, TIBC, IRON, RETICCTPCT in the last 72 hours. Sepsis Labs: No results for input(s): PROCALCITON, LATICACIDVEN in the last 168 hours.  Recent Results (from the past 240 hour(s))  Urine culture  Status: Abnormal (Preliminary result)   Collection Time: 10/11/19  3:03 PM   Specimen: Urine, Random  Result Value Ref Range Status   Specimen Description URINE, RANDOM  Final   Special Requests   Final    NONE Performed at [facility], [address] [place], [place] [address]    Culture 60,000 COLONIES/mL ESCHERICHIA COLI (A)  Final   Report Status PENDING  Incomplete  SARS Coronavirus 2 by RT PCR (hospital order, performed in [facility] hospital lab) Nasopharyngeal Nasopharyngeal Swab     Status: None   Collection Time: 10/11/19  3:50 PM   Specimen: Nasopharyngeal Swab  Result Value Ref Range Status   SARS Coronavirus 2 NEGATIVE NEGATIVE Final    Comment: (NOTE) SARS-CoV-2 target nucleic acids are NOT DETECTED.  The SARS-CoV-2 RNA is generally detectable in upper and lower respiratory specimens during the acute phase of infection. The lowest concentration of SARS-CoV-2 viral copies this assay can detect is 250 copies / mL. A negative result does not preclude SARS-CoV-2 infection and should not be used as the sole basis for treatment or other patient management decisions.  A negative result may occur with improper specimen collection / handling, submission of specimen other than nasopharyngeal swab, presence of viral mutation(s) within the areas targeted by this assay, and inadequate number of viral copies (<250 copies / mL). A negative result must be combined with clinical observations, patient history, and epidemiological information.  Fact Sheet for Patients:     StrictlyIdeas.no  Fact Sheet for Healthcare Providers: BankingDealers.co.za  This test is not yet approved or  cleared by the [place] FDA and has been authorized for detection and/or diagnosis of SARS-CoV-2 by FDA under an Emergency Use Authorization (EUA).  This EUA will remain in effect (meaning this test can be used) for the duration of the COVID-19 declaration under Section 564(b)(1) of the Act, 21 U.S.C. section 360bbb-3(b)(1), unless the authorization is terminated or revoked sooner.  Performed at [facility], [place] [address], [place], [place] [address]       Radiology Studies: DG Chest 2 View  Result Date: 10/11/2019 CLINICAL DATA:  86 year old female with history of altered level of consciousness. History of trauma from a fall. Pain. EXAM: CHEST - 2 VIEW COMPARISON:  Chest x-ray 08/06/2019. FINDINGS: Lung volumes are normal. No consolidative airspace disease. No pleural effusions. No pneumothorax. No pulmonary nodule or mass noted. Pulmonary vasculature and the cardiomediastinal silhouette are within normal limits. Atherosclerotic calcifications in the thoracic aorta. Status post left shoulder ORIF. IMPRESSION: 1. No radiographic evidence of acute cardiopulmonary disease. 2. Aortic atherosclerosis. Electronically Signed   By: Vinnie Langton M.D.   On: 10/11/2019 14:54   DG Shoulder Right  Result Date: 10/11/2019 CLINICAL DATA:  Fall, right shoulder pain EXAM: RIGHT SHOULDER - 2+ VIEW COMPARISON:  None. FINDINGS: There is no evidence of fracture or dislocation. Mild arthropathy of the glenohumeral and acromioclavicular joints. Soft tissues are unremarkable. IMPRESSION: No acute osseous abnormality. Mild arthropathy of the right shoulder. Electronically Signed   By: Davina Poke D.O.   On: 10/11/2019 14:54   CT HEAD WO CONTRAST  Result Date: 10/11/2019 CLINICAL DATA:  Altered mental status EXAM: CT HEAD WITHOUT  CONTRAST TECHNIQUE: Contiguous axial images were obtained from the base of the skull through the vertex without intravenous contrast. COMPARISON:  04/26/2019 FINDINGS: Brain: There is no acute intracranial hemorrhage, mass effect, or edema. No new loss of gray differentiation. There is no extra-axial fluid  collection. Patchy confluent areas of hypoattenuation in the supratentorial white matter are nonspecific but probably reflect stable chronic microvascular ischemic changes. Small chronic cortical infarct of the left precentral gyrus. Prominence of the ventricles and sulci reflects stable parenchymal volume loss. Vascular: There is atherosclerotic calcification at the skull base. Skull: Calvarium is unremarkable. Sinuses/Orbits: No acute finding. Other: None. IMPRESSION: No acute intracranial hemorrhage or evidence of acute infarction. Chronic findings detailed above. Electronically Signed   By: Macy Mis M.D.   On: 10/11/2019 18:30   DG Shoulder Left  Result Date: 10/11/2019 CLINICAL DATA:  Left shoulder pain after fall EXAM: LEFT SHOULDER - 2+ VIEW COMPARISON:  12/29/2018 FINDINGS: Prior ORIF of the proximal left humerus via a lateral sideplate and screw fixation construct. Hardware appears intact and unchanged in alignment. There is bridging callus formation over the fracture site. No definite perihardware fracture. Mild arthropathy of the AC in glenohumeral joints. Soft tissues within normal limits. IMPRESSION: Prior ORIF of the proximal left humerus without evidence of hardware complication. No acute fracture or dislocation. Electronically Signed   By: Davina Poke D.O.   On: 10/11/2019 14:57    Scheduled Meds: . carvedilol  3.125 mg Oral BID WC  . clopidogrel  75 mg Oral Daily  . insulin aspart  0-15 Units Subcutaneous TID WC  . insulin aspart  0-5 Units Subcutaneous QHS  . rosuvastatin  20 mg Oral Daily   Continuous Infusions: . heparin 950 Units/hr (10/12/19 1000)     LOS: 1 day     Time spent: 37 minutes   Darliss Cheney, MD [facility]  10/12/2019, 1:25 PM   To contact the attending provider between 7A-7P or the covering provider during after hours 7P-7A, please log into the web site [email or web address].

## 2019-10-13 ENCOUNTER — Inpatient Hospital Stay (HOSPITAL_COMMUNITY): Payer: Medicare Other

## 2019-10-13 DIAGNOSIS — I351 Nonrheumatic aortic (valve) insufficiency: Secondary | ICD-10-CM

## 2019-10-13 HISTORY — PX: TRANSTHORACIC ECHOCARDIOGRAM: SHX275

## 2019-10-13 LAB — URINE CULTURE: Culture: 60000 — AB

## 2019-10-13 LAB — HEPARIN LEVEL (UNFRACTIONATED)
Heparin Unfractionated: 0.26 IU/mL — ABNORMAL LOW (ref 0.30–0.70)
Heparin Unfractionated: 0.26 IU/mL — ABNORMAL LOW (ref 0.30–0.70)

## 2019-10-13 LAB — CBC
HCT: 31.1 % — ABNORMAL LOW (ref 36.0–46.0)
Hemoglobin: 9.8 g/dL — ABNORMAL LOW (ref 12.0–15.0)
MCH: 27.5 pg (ref 26.0–34.0)
MCHC: 31.5 g/dL (ref 30.0–36.0)
MCV: 87.4 fL (ref 80.0–100.0)
Platelets: 258 10*3/uL (ref 150–400)
RBC: 3.56 MIL/uL — ABNORMAL LOW (ref 3.87–5.11)
RDW: 12.3 % (ref 11.5–15.5)
WBC: 7.1 10*3/uL (ref 4.0–10.5)
nRBC: 0 % (ref 0.0–0.2)

## 2019-10-13 LAB — GLUCOSE, CAPILLARY
Glucose-Capillary: 140 mg/dL — ABNORMAL HIGH (ref 70–99)
Glucose-Capillary: 140 mg/dL — ABNORMAL HIGH (ref 70–99)

## 2019-10-13 LAB — BASIC METABOLIC PANEL
Anion gap: 10 (ref 5–15)
BUN: 21 mg/dL (ref 8–23)
CO2: 23 mmol/L (ref 22–32)
Calcium: 8.6 mg/dL — ABNORMAL LOW (ref 8.9–10.3)
Chloride: 102 mmol/L (ref 98–111)
Creatinine, Ser: 1.12 mg/dL — ABNORMAL HIGH (ref 0.44–1.00)
GFR calc Af Amer: 49 mL/min — ABNORMAL LOW (ref >60)
GFR calc non Af Amer: 42 mL/min — ABNORMAL LOW (ref >60)
Glucose, Bld: 141 mg/dL — ABNORMAL HIGH (ref 70–99)
Potassium: 3.3 mmol/L — ABNORMAL LOW (ref 3.5–5.1)
Sodium: 135 mmol/L (ref 135–145)

## 2019-10-13 LAB — ECHOCARDIOGRAM COMPLETE
Height: 72 in
Weight: 2321.6 oz

## 2019-10-13 MED ORDER — CLOPIDOGREL BISULFATE 75 MG PO TABS: 75.0000 mg | ORAL_TABLET | Freq: Every day | ORAL | 0 refills | Status: AC

## 2019-10-13 MED ORDER — POTASSIUM CHLORIDE CRYS ER 20 MEQ PO TBCR
40.0000 meq | EXTENDED_RELEASE_TABLET | ORAL | Status: AC
  Administered 2019-10-13 (×2): 40 meq via ORAL
  Filled 2019-10-13 (×2): qty 2

## 2019-10-13 MED ORDER — ROSUVASTATIN CALCIUM 20 MG PO TABS: 20.0000 mg | ORAL_TABLET | Freq: Every day | ORAL | 0 refills | Status: DC

## 2019-10-13 MED ORDER — CARVEDILOL 3.125 MG PO TABS: 3.1250 mg | ORAL_TABLET | Freq: Two times a day (BID) | ORAL | 0 refills | Status: DC

## 2019-10-13 NOTE — Plan of Care (Signed)
  Problem: Clinical Measurements: Goal: Respiratory complications will improve Outcome: Progressing   

## 2019-10-13 NOTE — Discharge Summary (Addendum)
Physician Discharge Summary  Vickie Young:[id number] DOB: [birth year] DOA: 10/11/2019  PCP: [name], MD  Admit date: 10/11/2019 Discharge date: 10/13/2019  Admitted From: Home Disposition: Home  Recommendations for Outpatient Follow-up:  1. Follow up with PCP in 1-2 weeks 2. Please obtain BMP/CBC in one week 3. Please follow up with your PCP on the following pending results: Unresulted Labs (From admission, onward) Comment          Start     Ordered   10/13/19 1200  Heparin level (unfractionated)  Once-Timed,   TIMED        10/13/19 0436   10/13/19 0932  Basic metabolic panel  Daily,   R      10/12/19 [place]: Yes Equipment/Devices: None  Discharge Condition: Stable CODE STATUS: DNR Diet recommendation: Cardiac/low-sodium  Subjective: Seen and examined.  She remains asymptomatic.  Brief/Interim Summary: Vickie Young a 86 y.o.femalewith medical history significant ofDM2, HTN, falls was brought into ED with concerns for poor appetite, decreased responsiveness which began on Monday (5 days prior to admission) associated with her having spastic type fasciculations first of the legs, and then today prior to her being brought to the emergency room was having similar fasciculations in both arms and the legs. She has fallen at least 3 or 4 times this week once on her right hip and arm.  She had no complaint of chest pain, shortness of breath or orthopnea.In the ED the work up is surprisngly remarkable for an apparent anterior wall STEMI with trops of 1600 and 1800 on repeat.  Cardiology was initially consulted who however recommended to continue supportive and medical management and recommended against intervention.  She was then admitted to hospitalist service.  She was continued on heparin drip for 48 hours per cardiology recommendation.  Cardiology followed patient while inpatient as well.  Multiple medications were adjusted such as stopping  amlodipine and adding Plavix and carvedilol and continuing her aspirin.  Crestor was also added.  Patient remained asymptomatic and alert and partially oriented at her baseline due to her Alzheimer's dementia.  Transthoracic echo showed normal ejection fraction with no wall motion abnormality.  Cardiology has cleared the patient today.  Patient is being discharged in stable condition.  I have discussed with her daughter Morene Rankins over the phone who is in agreement with this  Discharge Diagnoses:  Principal Problem:   Silent ST elevation myocardial infarction (STEMI) involving left anterior descending (LAD) coronary artery of anterior wall [facility]) Active Problems:   Diabetes mellitus without complication ([place])   Essential hypertension   Altered mental status   Dementia due to Alzheimer's disease ([place])   Failure to thrive in adult  Discharge Instructions   Allergies as of 10/13/2019   No Known Allergies     Medication List    STOP taking these medications   amLODipine 5 MG tablet Commonly known as: NORVASC   irbesartan 75 MG tablet Commonly known as: AVAPRO     TAKE these medications   acetaminophen 500 MG tablet Commonly known as: TYLENOL Take 1 tablet (500 mg total) by mouth every 8 (eight) hours as needed for moderate pain.   aspirin EC 81 MG EC tablet Generic drug: aspirin Take 81 mg by mouth daily. Swallow whole.   carvedilol 3.125 MG tablet Commonly known as: COREG Take 1 tablet (3.125 mg total) by mouth 2 (two) times daily with a meal.   clopidogrel  75 MG tablet Commonly known as: PLAVIX Take 1 tablet (75 mg total) by mouth daily. Start taking on: October 14, 2019   Combigan 0.2-0.5 % ophthalmic solution Generic drug: brimonidine-timolol Place 1 drop into both eyes at bedtime.   ferrous sulfate 325 (65 FE) MG tablet Take 1 tablet (325 mg total) by mouth 2 (two) times daily with a meal.   Jardiance 10 MG Tabs tablet Generic drug: empagliflozin Take 10 mg by  mouth daily.   losartan-hydrochlorothiazide 100-12.5 MG tablet Commonly known as: HYZAAR Take 1 tablet by mouth daily.   metFORMIN 500 MG tablet Commonly known as: GLUCOPHAGE Take 500 mg by mouth daily.   mirtazapine 15 MG disintegrating tablet Commonly known as: REMERON SOL-TAB Take 15 mg by mouth at bedtime.   multivitamin capsule Take 1 capsule by mouth daily.   polyethylene glycol 17 g packet Commonly known as: MiraLax Take 17 g by mouth daily.   rosuvastatin 20 MG tablet Commonly known as: CRESTOR Take 1 tablet (20 mg total) by mouth daily. Start taking on: October 14, 2019   Tradjenta 5 MG Tabs tablet Generic drug: linagliptin Take 5 mg by mouth daily.   Travoprost (BAK Free) 0.004 % Soln ophthalmic solution Commonly known as: TRAVATAN Place 1 drop into both eyes at bedtime.       Follow-up Information    Leonie Man, MD Follow up.   Specialty: Cardiology Why: office will call with date and time of appoitment  Contact information: [address] [place] [address] [phone]        [name], MD Follow up in 1 week(s).   Specialty: Internal Medicine Contact information: [place] [address] [phone]              No Known Allergies  Consultations: Cardiology   Procedures/Studies: DG Chest 2 View  Result Date: 10/11/2019 CLINICAL DATA:  86 year old female with history of altered level of consciousness. History of trauma from a fall. Pain. EXAM: CHEST - 2 VIEW COMPARISON:  Chest x-ray 08/06/2019. FINDINGS: Lung volumes are normal. No consolidative airspace disease. No pleural effusions. No pneumothorax. No pulmonary nodule or mass noted. Pulmonary vasculature and the cardiomediastinal silhouette are within normal limits. Atherosclerotic calcifications in the thoracic aorta. Status post left shoulder ORIF. IMPRESSION: 1. No radiographic evidence of acute cardiopulmonary disease. 2.  Aortic atherosclerosis. Electronically Signed   By: Vinnie Langton M.D.   On: 10/11/2019 14:54   DG Shoulder Right  Result Date: 10/11/2019 CLINICAL DATA:  Fall, right shoulder pain EXAM: RIGHT SHOULDER - 2+ VIEW COMPARISON:  None. FINDINGS: There is no evidence of fracture or dislocation. Mild arthropathy of the glenohumeral and acromioclavicular joints. Soft tissues are unremarkable. IMPRESSION: No acute osseous abnormality. Mild arthropathy of the right shoulder. Electronically Signed   By: Davina Poke D.O.   On: 10/11/2019 14:54   CT HEAD WO CONTRAST  Result Date: 10/11/2019 CLINICAL DATA:  Altered mental status EXAM: CT HEAD WITHOUT CONTRAST TECHNIQUE: Contiguous axial images were obtained from the base of the skull through the vertex without intravenous contrast. COMPARISON:  04/26/2019 FINDINGS: Brain: There is no acute intracranial hemorrhage, mass effect, or edema. No new loss of gray differentiation. There is no extra-axial fluid collection. Patchy confluent areas of hypoattenuation in the supratentorial white matter are nonspecific but probably reflect stable chronic microvascular ischemic changes. Small chronic cortical infarct of the left precentral gyrus. Prominence of the ventricles and sulci reflects stable parenchymal volume loss.  Vascular: There is atherosclerotic calcification at the skull base. Skull: Calvarium is unremarkable. Sinuses/Orbits: No acute finding. Other: None. IMPRESSION: No acute intracranial hemorrhage or evidence of acute infarction. Chronic findings detailed above. Electronically Signed   By: Macy Mis M.D.   On: 10/11/2019 18:30   DG Shoulder Left  Result Date: 10/11/2019 CLINICAL DATA:  Left shoulder pain after fall EXAM: LEFT SHOULDER - 2+ VIEW COMPARISON:  12/29/2018 FINDINGS: Prior ORIF of the proximal left humerus via a lateral sideplate and screw fixation construct. Hardware appears intact and unchanged in alignment. There is bridging callus  formation over the fracture site. No definite perihardware fracture. Mild arthropathy of the AC in glenohumeral joints. Soft tissues within normal limits. IMPRESSION: Prior ORIF of the proximal left humerus without evidence of hardware complication. No acute fracture or dislocation. Electronically Signed   By: Davina Poke D.O.   On: 10/11/2019 14:57   ECHOCARDIOGRAM COMPLETE  Result Date: 10/13/2019    ECHOCARDIOGRAM REPORT   Patient Name:   Vickie Young Date of Exam: 10/13/2019 Medical Rec #:  [id number]         Height:       72.0 in Accession #:    [id number]        Weight:       145.1 lb Date of Birth:  [birth year]         BSA:          1.859 m Patient Age:    30 years          BP:           155/81 mmHg Patient Gender: F                 HR:           72 bpm. Exam Location:  Inpatient Procedure: 2D Echo, Cardiac Doppler and Color Doppler Indications:    Palpitations  History:        Patient has no prior history of Echocardiogram examinations.                 Risk Factors:Diabetes and Dyslipidemia.  Sonographer:    Clayton Lefort RDCS (AE) Referring Phys: [place]  1. Left ventricular ejection fraction, by estimation, is 60 to 65%. The left ventricle has normal function. The left ventricle has no regional wall motion abnormalities. There is moderate concentric left ventricular hypertrophy. Left ventricular diastolic parameters are consistent with Grade I diastolic dysfunction (impaired relaxation).  2. Right ventricular systolic function is normal. The right ventricular size is normal. Tricuspid regurgitation signal is inadequate for assessing PA pressure.  3. Left atrial size was mildly dilated.  4. The mitral valve is normal in structure. No evidence of mitral valve regurgitation. No evidence of mitral stenosis.  5. The aortic valve is normal in structure. Aortic valve regurgitation is mild. Mild to moderate aortic valve sclerosis/calcification is present, without any evidence of  aortic stenosis.  6. The inferior vena cava is normal in size with greater than 50% respiratory variability, suggesting right atrial pressure of 3 mmHg. FINDINGS  Left Ventricle: Left ventricular ejection fraction, by estimation, is 60 to 65%. The left ventricle has normal function. The left ventricle has no regional wall motion abnormalities. The left ventricular internal cavity size was normal in size. There is  moderate concentric left ventricular hypertrophy. Left ventricular diastolic parameters are consistent with Grade I diastolic dysfunction (impaired relaxation). Indeterminate filling pressures. Right Ventricle: The right ventricular size  is normal. No increase in right ventricular wall thickness. Right ventricular systolic function is normal. Tricuspid regurgitation signal is inadequate for assessing PA pressure. Left Atrium: Left atrial size was mildly dilated. Right Atrium: Right atrial size was normal in size. Pericardium: There is no evidence of pericardial effusion. Mitral Valve: The mitral valve is normal in structure. There is moderate thickening of the mitral valve leaflet(s). There is moderate calcification of the mitral valve leaflet(s). Normal mobility of the mitral valve leaflets. Moderate to severe mitral annular calcification. No evidence of mitral valve regurgitation. No evidence of mitral valve stenosis. MV peak gradient, 5.0 mmHg. The mean mitral valve gradient is 2.0 mmHg. Tricuspid Valve: The tricuspid valve is normal in structure. Tricuspid valve regurgitation is not demonstrated. No evidence of tricuspid stenosis. Aortic Valve: The aortic valve is normal in structure. Aortic valve regurgitation is mild. Aortic regurgitation PHT measures 754 msec. Mild to moderate aortic valve sclerosis/calcification is present, without any evidence of aortic stenosis. Aortic valve  mean gradient measures 4.0 mmHg. Aortic valve peak gradient measures 6.7 mmHg. Aortic valve area, by VTI measures 2.55  cm. Pulmonic Valve: The pulmonic valve was normal in structure. Pulmonic valve regurgitation is not visualized. No evidence of pulmonic stenosis. Aorta: The aortic root is normal in size and structure. Venous: The inferior vena cava is normal in size with greater than 50% respiratory variability, suggesting right atrial pressure of 3 mmHg. IAS/Shunts: No atrial level shunt detected by color flow Doppler.  LEFT VENTRICLE PLAX 2D LVIDd:         2.90 cm  Diastology LVIDs:         1.90 cm  LV e' lateral:   5.36 cm/s LV PW:         1.50 cm  LV E/e' lateral: 12.5 LV IVS:        1.30 cm  LV e' medial:    3.30 cm/s LVOT diam:     1.90 cm  LV E/e' medial:  20.2 LV SV:         69 LV SV Index:   37 LVOT Area:     2.84 cm  RIGHT VENTRICLE            IVC RV Basal diam:  2.50 cm    IVC diam: 1.50 cm RV S prime:     8.73 cm/s TAPSE (M-mode): 1.8 cm LEFT ATRIUM             Index       RIGHT ATRIUM          Index LA diam:        2.30 cm 1.24 cm/m  RA Area:     9.04 cm LA Vol (A2C):   58.4 ml 31.42 ml/m RA Volume:   13.80 ml 7.42 ml/m LA Vol (A4C):   55.0 ml 29.59 ml/m LA Biplane Vol: 56.7 ml 30.50 ml/m  AORTIC VALVE AV Area (Vmax):    2.68 cm AV Area (Vmean):   2.48 cm AV Area (VTI):     2.55 cm AV Vmax:           129.00 cm/s AV Vmean:          89.400 cm/s AV VTI:            0.269 m AV Peak Grad:      6.7 mmHg AV Mean Grad:      4.0 mmHg LVOT Vmax:         122.00 cm/s LVOT Vmean:  78.200 cm/s LVOT VTI:          0.242 m LVOT/AV VTI ratio: 0.90 AI PHT:            754 msec  AORTA Ao Root diam: 3.30 cm MITRAL VALVE MV Area (PHT): 3.48 cm     SHUNTS MV Peak grad:  5.0 mmHg     Systemic VTI:  0.24 m MV Mean grad:  2.0 mmHg     Systemic Diam: 1.90 cm MV Vmax:       1.12 m/s MV Vmean:      61.0 cm/s MV Decel Time: 218 msec MV E velocity: 66.80 cm/s MV A velocity: 112.00 cm/s MV E/A ratio:  0.60 Mihai Croitoru MD Electronically signed by Sanda Klein MD Signature Date/Time: 10/13/2019/12:23:03 PM    Final        Discharge Exam: Vitals:   10/13/19 0740 10/13/19 1135  BP: (!) 155/81   Pulse: 77   Resp: 18   Temp: 98.8 F (37.1 C) 98.9 F (37.2 C)  SpO2: 98%    Vitals:   10/13/19 0300 10/13/19 0316 10/13/19 0740 10/13/19 1135  BP:  (!) 140/57 (!) 155/81   Pulse:  73 77   Resp:  17 18   Temp:  98.9 F (37.2 C) 98.8 F (37.1 C) 98.9 F (37.2 C)  TempSrc:  Oral Oral Oral  SpO2:  98% 98%   Weight: 65.8 kg     Height:        General: Pt is alert, awake, not in acute distress Cardiovascular: RRR, S1/S2 +, no rubs, no gallops Respiratory: CTA bilaterally, no wheezing, no rhonchi Abdominal: Soft, NT, ND, bowel sounds + Extremities: no edema, no cyanosis    The results of significant diagnostics from this hospitalization (including imaging, microbiology, ancillary and laboratory) are listed below for reference.     Microbiology: Recent Results (from the past 240 hour(s))  Urine culture     Status: Abnormal   Collection Time: 10/11/19  3:03 PM   Specimen: Urine, Random  Result Value Ref Range Status   Specimen Description URINE, RANDOM  Final   Special Requests   Final    NONE Performed at [facility], [address] [place], [place] [address]    Culture 60,000 COLONIES/mL ESCHERICHIA COLI (A)  Final   Report Status 10/13/2019 FINAL  Final   Organism ID, Bacteria ESCHERICHIA COLI (A)  Final      Susceptibility   Escherichia coli - MIC*    AMPICILLIN <=2 SENSITIVE Sensitive     CEFAZOLIN <=4 SENSITIVE Sensitive     CEFTRIAXONE <=0.25 SENSITIVE Sensitive     CIPROFLOXACIN <=0.25 SENSITIVE Sensitive     GENTAMICIN <=1 SENSITIVE Sensitive     IMIPENEM <=0.25 SENSITIVE Sensitive     NITROFURANTOIN <=16 SENSITIVE Sensitive     TRIMETH/SULFA <=20 SENSITIVE Sensitive     AMPICILLIN/SULBACTAM <=2 SENSITIVE Sensitive     PIP/TAZO <=4 SENSITIVE Sensitive     * 60,000 COLONIES/mL ESCHERICHIA COLI  SARS Coronavirus 2 by RT PCR (hospital order, performed in [facility]  hospital lab) Nasopharyngeal Nasopharyngeal Swab     Status: None   Collection Time: 10/11/19  3:50 PM   Specimen: Nasopharyngeal Swab  Result Value Ref Range Status   SARS Coronavirus 2 NEGATIVE NEGATIVE Final    Comment: (NOTE) SARS-CoV-2 target nucleic acids are NOT DETECTED.  The SARS-CoV-2 RNA is generally detectable in upper and lower respiratory specimens during the acute phase of infection. The  lowest concentration of SARS-CoV-2 viral copies this assay can detect is 250 copies / mL. A negative result does not preclude SARS-CoV-2 infection and should not be used as the sole basis for treatment or other patient management decisions.  A negative result may occur with improper specimen collection / handling, submission of specimen other than nasopharyngeal swab, presence of viral mutation(s) within the areas targeted by this assay, and inadequate number of viral copies (<250 copies / mL). A negative result must be combined with clinical observations, patient history, and epidemiological information.  Fact Sheet for Patients:   StrictlyIdeas.no  Fact Sheet for Healthcare Providers: BankingDealers.co.za  This test is not yet approved or  cleared by the [place] FDA and has been authorized for detection and/or diagnosis of SARS-CoV-2 by FDA under an Emergency Use Authorization (EUA).  This EUA will remain in effect (meaning this test can be used) for the duration of the COVID-19 declaration under Section 564(b)(1) of the Act, 21 U.S.C. section 360bbb-3(b)(1), unless the authorization is terminated or revoked sooner.  Performed at [facility], [place] [address], [place], [place] [address]      Labs: BNP (last 3 results) No results for input(s): BNP in the last 8760 hours. Basic Metabolic Panel: Recent Labs  Lab 10/11/19 1513 10/11/19 1519 10/13/19 0341  NA 135 137 135  K 3.5 3.4* 3.3*  CL 100  --  102  CO2 21*   --  23  GLUCOSE 225*  --  141*  BUN 32*  --  21  CREATININE 1.25*  --  1.12*  CALCIUM 8.9  --  8.6*   Liver Function Tests: Recent Labs  Lab 10/11/19 1513  AST 39  ALT 26  ALKPHOS 61  BILITOT 0.9  PROT 7.7  ALBUMIN 3.3*   No results for input(s): LIPASE, AMYLASE in the last 168 hours. Recent Labs  Lab 10/11/19 1513  AMMONIA 10   CBC: Recent Labs  Lab 10/11/19 1513 10/11/19 1519 10/12/19 0736 10/13/19 0341  WBC 11.8*  --  8.3 7.1  NEUTROABS 9.2*  --   --   --   HGB 9.1* 10.2* 8.8* 9.8*  HCT 30.1* 30.0* 27.9* 31.1*  MCV 93.5  --  88.0 87.4  PLT 233  --  278 258   Cardiac Enzymes: No results for input(s): CKTOTAL, CKMB, CKMBINDEX, TROPONINI in the last 168 hours. BNP: Invalid input(s): POCBNP CBG: Recent Labs  Lab 10/11/19 2213 10/12/19 0628 10/12/19 2057 10/13/19 0615 10/13/19 1133  GLUCAP 142* 126* 147* 140* 140*   D-Dimer No results for input(s): DDIMER in the last 72 hours. Hgb A1c Recent Labs    10/11/19 2355  HGBA1C 7.2*   Lipid Profile Recent Labs    10/11/19 1927  CHOL 145  HDL 33*  LDLCALC 86  TRIG 131  CHOLHDL 4.4   Thyroid function studies Recent Labs    10/11/19 1513  TSH 1.160   Anemia work up No results for input(s): VITAMINB12, FOLATE, FERRITIN, TIBC, IRON, RETICCTPCT in the last 72 hours. Urinalysis    Component Value Date/Time   COLORURINE YELLOW 10/11/2019 1503   APPEARANCEUR CLEAR 10/11/2019 1503   LABSPEC 1.025 10/11/2019 1503   PHURINE 5.0 10/11/2019 1503   GLUCOSEU >=500 (A) 10/11/2019 1503   HGBUR NEGATIVE 10/11/2019 1503   BILIRUBINUR NEGATIVE 10/11/2019 1503   KETONESUR NEGATIVE 10/11/2019 1503   PROTEINUR NEGATIVE 10/11/2019 1503   UROBILINOGEN 1.0 08/07/2007 2147   NITRITE NEGATIVE 10/11/2019 1503   LEUKOCYTESUR LARGE (  A) 10/11/2019 1503   Sepsis Labs Invalid input(s): PROCALCITONIN,  WBC,  LACTICIDVEN Microbiology Recent Results (from the past 240 hour(s))  Urine culture     Status: Abnormal    Collection Time: 10/11/19  3:03 PM   Specimen: Urine, Random  Result Value Ref Range Status   Specimen Description URINE, RANDOM  Final   Special Requests   Final    NONE Performed at [facility], [address] [place], [place] [address]    Culture 60,000 COLONIES/mL ESCHERICHIA COLI (A)  Final   Report Status 10/13/2019 FINAL  Final   Organism ID, Bacteria ESCHERICHIA COLI (A)  Final      Susceptibility   Escherichia coli - MIC*    AMPICILLIN <=2 SENSITIVE Sensitive     CEFAZOLIN <=4 SENSITIVE Sensitive     CEFTRIAXONE <=0.25 SENSITIVE Sensitive     CIPROFLOXACIN <=0.25 SENSITIVE Sensitive     GENTAMICIN <=1 SENSITIVE Sensitive     IMIPENEM <=0.25 SENSITIVE Sensitive     NITROFURANTOIN <=16 SENSITIVE Sensitive     TRIMETH/SULFA <=20 SENSITIVE Sensitive     AMPICILLIN/SULBACTAM <=2 SENSITIVE Sensitive     PIP/TAZO <=4 SENSITIVE Sensitive     * 60,000 COLONIES/mL ESCHERICHIA COLI  SARS Coronavirus 2 by RT PCR (hospital order, performed in [facility] hospital lab) Nasopharyngeal Nasopharyngeal Swab     Status: None   Collection Time: 10/11/19  3:50 PM   Specimen: Nasopharyngeal Swab  Result Value Ref Range Status   SARS Coronavirus 2 NEGATIVE NEGATIVE Final    Comment: (NOTE) SARS-CoV-2 target nucleic acids are NOT DETECTED.  The SARS-CoV-2 RNA is generally detectable in upper and lower respiratory specimens during the acute phase of infection. The lowest concentration of SARS-CoV-2 viral copies this assay can detect is 250 copies / mL. A negative result does not preclude SARS-CoV-2 infection and should not be used as the sole basis for treatment or other patient management decisions.  A negative result may occur with improper specimen collection / handling, submission of specimen other than nasopharyngeal swab, presence of viral mutation(s) within the areas targeted by this assay, and inadequate number of viral copies (<250 copies / mL). A negative result must be  combined with clinical observations, patient history, and epidemiological information.  Fact Sheet for Patients:   StrictlyIdeas.no  Fact Sheet for Healthcare Providers: BankingDealers.co.za  This test is not yet approved or  cleared by the [place] FDA and has been authorized for detection and/or diagnosis of SARS-CoV-2 by FDA under an Emergency Use Authorization (EUA).  This EUA will remain in effect (meaning this test can be used) for the duration of the COVID-19 declaration under Section 564(b)(1) of the Act, 21 U.S.C. section 360bbb-3(b)(1), unless the authorization is terminated or revoked sooner.  Performed at [facility], [place] [address] [place], [place] [address]      Time coordinating discharge: Over 30 minutes  SIGNED:   Darliss Cheney, MD  [facility] 10/13/2019, 1:42 PM  If 7PM-7AM, please contact night-coverage [email or web address]

## 2019-10-13 NOTE — Progress Notes (Signed)
Progress Note  Patient Name: Vickie Young Date of Encounter: 10/13/2019  [facility] Cardiologist: No primary care provider on file. [place])  Subjective   She has done well overnight and has not had angina or dyspnea.  Inpatient Medications    Scheduled Meds: . brimonidine  1 drop Both Eyes QHS   And  . timolol  1 drop Both Eyes QHS  . carvedilol  3.125 mg Oral BID WC  . clopidogrel  75 mg Oral Daily  . ferrous sulfate  325 mg Oral BID WC  . insulin aspart  0-15 Units Subcutaneous TID WC  . insulin aspart  0-5 Units Subcutaneous QHS  . mirtazapine  15 mg Oral QHS  . polyethylene glycol  17 g Oral Daily  . potassium chloride  40 mEq Oral Q4H  . rosuvastatin  20 mg Oral Daily   Continuous Infusions: . heparin 750 Units/hr (10/13/19 0457)   PRN Meds: acetaminophen, nitroGLYCERIN, ondansetron (ZOFRAN) IV   Vital Signs    Vitals:   10/13/19 0300 10/13/19 0316 10/13/19 0740 10/13/19 1135  BP:  (!) 140/57 (!) 155/81   Pulse:  73 77   Resp:  17 18   Temp:  98.9 F (37.2 C) 98.8 F (37.1 C) 98.9 F (37.2 C)  TempSrc:  Oral Oral Oral  SpO2:  98% 98%   Weight: 65.8 kg     Height:        Intake/Output Summary (Last 24 hours) at 10/13/2019 1210 Last data filed at 10/13/2019 1045 Gross per 24 hour  Intake 751.05 ml  Output 675 ml  Net 76.05 ml   Last 3 Weights 10/13/2019 10/12/2019 10/11/2019  Weight (lbs) 145 lb 1.6 oz 145 lb 1.6 oz 141 lb  Weight (kg) 65.817 kg 65.817 kg 63.957 kg      Telemetry    Sinus rhythm- Personally Reviewed  ECG    No new tracing- Personally Reviewed  Physical Exam  Appears quite comfortable lying flat in bed GEN: No acute distress.   Neck: No JVD Cardiac: RRR, no murmurs, rubs, or gallops.  Respiratory: Clear to auscultation bilaterally. GI: Soft, nontender, non-distended  MS: No edema; No deformity. Neuro:  Nonfocal  Psych: Normal affect   Labs    High Sensitivity Troponin:   Recent Labs  Lab  10/11/19 1513 10/11/19 1721 10/11/19 1925 10/11/19 2355 10/12/19 0026  TROPONINIHS 1,604* 1,830* 1,636* 989* 1,275*      Chemistry Recent Labs  Lab 10/11/19 1513 10/11/19 1519 10/13/19 0341  NA 135 137 135  K 3.5 3.4* 3.3*  CL 100  --  102  CO2 21*  --  23  GLUCOSE 225*  --  141*  BUN 32*  --  21  CREATININE 1.25*  --  1.12*  CALCIUM 8.9  --  8.6*  PROT 7.7  --   --   ALBUMIN 3.3*  --   --   AST 39  --   --   ALT 26  --   --   ALKPHOS 61  --   --   BILITOT 0.9  --   --   GFRNONAA 37*  --  42*  GFRAA 43*  --  49*  ANIONGAP 14  --  10     Hematology Recent Labs  Lab 10/11/19 1513 10/11/19 1513 10/11/19 1519 10/12/19 0736 10/13/19 0341  WBC 11.8*  --   --  8.3 7.1  RBC 3.22*  --   --  3.17* 3.56*  HGB 9.1*   < > 10.2* 8.8* 9.8*  HCT 30.1*   < > 30.0* 27.9* 31.1*  MCV 93.5  --   --  88.0 87.4  MCH 28.3  --   --  27.8 27.5  MCHC 30.2  --   --  31.5 31.5  RDW 12.2  --   --  12.2 12.3  PLT 233  --   --  278 258   < > = values in this interval not displayed.    BNPNo results for input(s): BNP, PROBNP in the last 168 hours.   DDimer No results for input(s): DDIMER in the last 168 hours.   Radiology    DG Chest 2 View  Result Date: 10/11/2019 CLINICAL DATA:  86 year old female with history of altered level of consciousness. History of trauma from a fall. Pain. EXAM: CHEST - 2 VIEW COMPARISON:  Chest x-ray 08/06/2019. FINDINGS: Lung volumes are normal. No consolidative airspace disease. No pleural effusions. No pneumothorax. No pulmonary nodule or mass noted. Pulmonary vasculature and the cardiomediastinal silhouette are within normal limits. Atherosclerotic calcifications in the thoracic aorta. Status post left shoulder ORIF. IMPRESSION: 1. No radiographic evidence of acute cardiopulmonary disease. 2. Aortic atherosclerosis. Electronically Signed   By: Vinnie Langton M.D.   On: 10/11/2019 14:54   DG Shoulder Right  Result Date: 10/11/2019 CLINICAL DATA:   Fall, right shoulder pain EXAM: RIGHT SHOULDER - 2+ VIEW COMPARISON:  None. FINDINGS: There is no evidence of fracture or dislocation. Mild arthropathy of the glenohumeral and acromioclavicular joints. Soft tissues are unremarkable. IMPRESSION: No acute osseous abnormality. Mild arthropathy of the right shoulder. Electronically Signed   By: Davina Poke D.O.   On: 10/11/2019 14:54   CT HEAD WO CONTRAST  Result Date: 10/11/2019 CLINICAL DATA:  Altered mental status EXAM: CT HEAD WITHOUT CONTRAST TECHNIQUE: Contiguous axial images were obtained from the base of the skull through the vertex without intravenous contrast. COMPARISON:  04/26/2019 FINDINGS: Brain: There is no acute intracranial hemorrhage, mass effect, or edema. No new loss of gray differentiation. There is no extra-axial fluid collection. Patchy confluent areas of hypoattenuation in the supratentorial white matter are nonspecific but probably reflect stable chronic microvascular ischemic changes. Small chronic cortical infarct of the left precentral gyrus. Prominence of the ventricles and sulci reflects stable parenchymal volume loss. Vascular: There is atherosclerotic calcification at the skull base. Skull: Calvarium is unremarkable. Sinuses/Orbits: No acute finding. Other: None. IMPRESSION: No acute intracranial hemorrhage or evidence of acute infarction. Chronic findings detailed above. Electronically Signed   By: Macy Mis M.D.   On: 10/11/2019 18:30   DG Shoulder Left  Result Date: 10/11/2019 CLINICAL DATA:  Left shoulder pain after fall EXAM: LEFT SHOULDER - 2+ VIEW COMPARISON:  12/29/2018 FINDINGS: Prior ORIF of the proximal left humerus via a lateral sideplate and screw fixation construct. Hardware appears intact and unchanged in alignment. There is bridging callus formation over the fracture site. No definite perihardware fracture. Mild arthropathy of the AC in glenohumeral joints. Soft tissues within normal limits. IMPRESSION:  Prior ORIF of the proximal left humerus without evidence of hardware complication. No acute fracture or dislocation. Electronically Signed   By: Davina Poke D.O.   On: 10/11/2019 14:57    Cardiac Studies   Bedside review of echocardiogram shows preserved overall left ventricular systolic function and very subtle regional wall motion abnormalities.  She does have degenerative valve changes involving both the mitral and aortic valve, but without major valvular abnormalities (mild-moderate  AI).  There is no pericardial effusion  Patient Profile     86 y.o. female with late presentation of anterior STEMI, but without postinfarction angina, arrhythmia or heart failure and has preserved left ventricular systolic function.  Back on problems include type 2 diabetes mellitus and dementia  Assessment & Plan    Good news on echocardiogram. No evidence of high risk postinfarction complications. Stop intravenous heparin. Ready to go home from cardiology point of view. [facility] will sign off.   Medication Recommendations:   -Carvedilol 3.125 mg twice daily -Clopidogrel 75 mg once daily -Aspirin 81 mg once daily -Rosuvastatin 20 mg once daily -Resume Jardiance 10 mg once daily after discharge -Resume losartan-hydrochlorothiazide 100/12.5 mg once daily -Stop amlodipine -Irbesartan was also listed on her home meds.  This should not be resumed if she takes losartan Other recommendations (labs, testing, etc):  n/a Follow up as an outpatient:  2 weeks TOC follow-up  For questions or updates, please contact [place] Please consult [email or web address] for contact info under        Signed, Sanda Klein, MD  10/13/2019, 12:10 PM

## 2019-10-13 NOTE — TOC Transition Note (Signed)
Transition of Care [facility]) - CM/SW Discharge Note   Patient Details  Name: Vickie Young MRN: [id number] Date of Birth: 1924/01/12  [facility] of [facility]) CM/SW Contact:  Carles Collet, RN Phone Number: 10/13/2019, 3:42 PM   Clinical Narrative:    Damaris Schooner w patient's daughters at bedside. Discussed DME and HH needs. Discussed provider ratings. DME rollator will be delivered to the home (address verified) family declined waiting for bedside delivery. They will look into bed rails likely from [email or web address] declined any other DME. HH referral accepted by Johnney Killian will call family tonight.     Final next level of care: [place] Barriers to Discharge: No Barriers Identified   Patient Goals and CMS Choice Patient states their goals for this hospitalization and ongoing recovery are:: to go home CMS Medicare.gov Compare Post Acute Care list provided to:: Other (Comment Required) Choice offered to / list presented to : Adult Children  Discharge Placement                       Discharge Plan and Services                DME Arranged: Walker rolling with seat DME Agency: [employer] Date DME Agency Contacted: 10/13/19 Time DME Agency Contacted: [id number] Representative spoke with at DME Agency: [place]: PT [place]: [place] Date [place]: 10/13/19 Time [place]: [phone] Representative spoke with at [place]: cheryl  Social Determinants of Health ([place]) Interventions     Readmission Risk Interventions No flowsheet data found.

## 2019-10-13 NOTE — Progress Notes (Signed)
  Echocardiogram 2D Echocardiogram has been performed.  Vickie Young 10/13/2019, 12:02 PM

## 2019-10-13 NOTE — Progress Notes (Signed)
   10/13/19 1400  PT Recommendation  Follow Up Recommendations Home health PT;Supervision/Assistance - 24 hour  PT equipment Hospital bed (rollator)  Full note to follow.  Recommendations above.  Secure chat to CM.  Thanks.  Rakia Frayne W,PT Acute Rehabilitation Services Pager:  [phone]  Office:  [phone]

## 2019-10-13 NOTE — Discharge Instructions (Signed)
Acute Coronary Syndrome Acute coronary syndrome (ACS) is a serious problem in which there is suddenly not enough blood and oxygen reaching the heart. ACS can result in chest pain or a heart attack. This condition is a medical emergency. If you have any symptoms of this condition, get help right away. What are the causes? This condition may be caused by:  A buildup of fat and cholesterol inside the arteries (atherosclerosis). This is the most common cause. The buildup (plaque) can cause blood vessels in the heart (coronary arteries) to become narrow or blocked, which reduces blood flow to the heart. Plaque can also break off and lead to a clot, which can block an artery and cause a heart attack or stroke.  Sudden tightening of the muscles around the coronary arteries (coronary spasm).  Tearing of a coronary artery (spontaneous coronary artery dissection).  Very low blood pressure (hypotension).  An abnormal heartbeat (arrhythmia).  Other medical conditions that cause a decrease of oxygen to the heart, such as anemiaorrespiratory failure.  Using cocaine or methamphetamine. What increases the risk? The following factors may make you more likely to develop this condition:  Age. The risk for ACS increases as you get older.  History of chest pain, heart attack, peripheral artery disease, or stroke.  Having taken chemotherapy or immune-suppressing medicines.  Being female.  Family history of chest pain, heart disease, or stroke.  Smoking.  Not exercising enough.  Being overweight.  High cholesterol.  High blood pressure (hypertension).  Diabetes.  Excessive alcohol use. What are the signs or symptoms? Common symptoms of this condition include:  Chest pain. The pain may last a long time, or it may stop and come back (recur). It may feel like: ? Crushing or squeezing. ? Tightness, pressure, fullness, or heaviness.  Arm, neck, jaw, or back pain.  Heartburn or  indigestion.  Shortness of breath.  Nausea.  Sudden cold sweats.  Light-headedness.  Dizziness or passing out.  Tiredness (fatigue). Sometimes there are no symptoms. How is this diagnosed? This condition may be diagnosed based on:  Your medical history and symptoms.  Imaging tests, such as: ? An electrocardiogram (ECG). This measures the heart's electrical activity. ? X-rays. ? CT scan. ? A coronary angiogram. For this test, dye is injected into the heart arteries and then X-rays are taken. ? Myocardial perfusion imaging. This test shows how well blood flows through your heart muscle.  Blood tests. These may be repeated at certain time intervals.  Exercise stress testing.  Echocardiogram. This is a test that uses sound waves to produce detailed images of the heart. How is this treated? Treatment for this condition may include:  Oxygen therapy.  Medicines, such as: ? Antiplatelet medicines and blood-thinning medicines, such as aspirin. These help prevent blood clots. ? Medicine that dissolves any blood clots (fibrinolytic therapy). ? Blood pressure medicines. ? Nitroglycerin. This helps widen blood vessels to improve blood flow. ? Pain medicine. ? Cholesterol-lowering medicine.  Surgery, such as: ? Coronary angioplasty with stent placement. This involves placing a small piece of metal that looks like mesh or a spring into a narrow coronary artery. This widens the artery and keeps it open. ? Coronary artery bypass surgery. This involves taking a section of a blood vessel from a different part of your body and placing it on the blocked coronary artery to allow blood to flow around the blockage.  Cardiac rehabilitation. This is a program that includes exercise training, education, and counseling to help you recover.   Follow these instructions at home: Eating and drinking  Eat a heart-healthy diet that includes whole grains, fruits and vegetables, lean proteins, and  low-fat or nonfat dairy products.  Limit how much salt (sodium) you eat as told by your health care provider. Follow instructions from your health care provider about any other eating or drinking restrictions, such as limiting foods that are high in fat and processed sugars.  Use healthy cooking methods such as roasting, grilling, broiling, baking, poaching, steaming, or stir-frying.  Work with a dietitian to follow a heart-healthy eating plan. Medicines  Take over-the-counter and prescription medicines only as told by your health care provider.  Do not take these medicines unless your health care provider approves: ? Vitamin supplements that contain vitamin A or vitamin E. ? NSAIDs, such as ibuprofen, naproxen, or celecoxib. ? Hormone replacement therapy that contains estrogen.  If you are taking blood thinners: ? Talk with your health care provider before you take any medicines that contain aspirin or NSAIDs. These medicines increase your risk for dangerous bleeding. ? Take your medicine exactly as told, at the same time every day. ? Avoid activities that could cause injury or bruising, and follow instructions about how to prevent falls. ? Wear a medical alert bracelet, and carry a card that lists what medicines you take. Activity  Follow your cardiac rehabilitation program. Do exercises as told by your physical therapist.  Ask your health care provider what activities and exercises are safe for you. Follow his or her instructions about lifting, driving, or climbing stairs. Lifestyle  Do not use any products that contain nicotine or tobacco, such as cigarettes, e-cigarettes, and chewing tobacco. If you need help quitting, ask your health care provider.  Do not drink alcohol if your health care provider tells you not to drink.  If you drink alcohol: ? Limit how much you have to 0-1 drink a day. ? Be aware of how much alcohol is in your drink. In the [place] one drink equals one 12 oz  bottle of beer (355 mL), one 5 oz glass of wine (148 mL), or one 1 oz glass of hard liquor (44 mL).  Maintain a healthy weight. If you need to lose weight, work with your health care provider to do so safely. General instructions  Tell all the health care providers who provide care for you about your heart condition, including your dentist. This may affect the medicines or treatment you receive.  Manage any other health conditions you have, such as hypertension or diabetes. These conditions affect your heart.  Pay attention to your mental health. You may be at higher risk for depression. ? Find ways to manage stress. ? Talk to your health care provider about depression screening and treatment.  Keep your vaccinations up to date. ? Get the flu shot (influenza vaccine) every year. ? Get the pneumococcal vaccine if you are age 65 or older.  If directed, monitor your blood pressure at home.  Keep all follow-up visits as told by your health care provider. This is important. Contact a health care provider if you:  Feel overwhelmed or sad.  Have trouble doing your daily activities. Get help right away if you:  Have pain in your chest, neck, arm, jaw, stomach, or back that recurs, and: ? It lasts for more than a few minutes. ? It is not relieved by taking the medicineyour health care provider prescribed.  Have unexplained: ? Heavy sweating. ? Heartburn or indigestion. ? Nausea or vomiting. ?   Shortness of breath. ? Difficulty breathing. ? Fatigue. ? Nervousness or anxiety. ? Weakness. ? Diarrhea. ? Dark stools or blood in your stool.  Have sudden light-headedness or dizziness.  Have blood pressure that is higher than 180/120.  Faint.  Have thoughts about hurting yourself. These symptoms may represent a serious problem that is an emergency. Do not wait to see if the symptoms will go away. Get medical help right away. Call your local emergency services (911 in the [place] Do  not drive yourself to the hospital.  Summary  Acute coronary syndrome (ACS) is when there is not enough blood and oxygen being supplied to the heart. ACS can result in chest pain or a heart attack.  Acute coronary syndrome is a medical emergency. If you have any symptoms of this condition, get help right away.  Treatment includes medicines and procedures to open the blocked arteries and restore blood flow. This information is not intended to replace advice given to you by your health care provider. Make sure you discuss any questions you have with your health care provider. Document Revised: 09/05/2018 Document Reviewed: 04/16/2018 Elsevier Patient Education  2020 Elsevier Inc.  

## 2019-10-13 NOTE — Progress Notes (Signed)
ANTICOAGULATION CONSULT NOTE - Follow Up Consult  Pharmacy Consult for heparin Indication: STEMI  Labs: Recent Labs    10/11/19 1513 10/11/19 1513 10/11/19 1519 10/11/19 1519 10/11/19 1721 10/11/19 1925 10/11/19 2355 10/12/19 0026 10/12/19 0736 10/12/19 1734 10/13/19 0341  HGB 9.1*   < > 10.2*   < >  --   --   --   --  8.8*  --  9.8*  HCT 30.1*   < > 30.0*  --   --   --   --   --  27.9*  --  31.1*  PLT 233  --   --   --   --   --   --   --  278  --  258  HEPARINUNFRC  --   --   --   --   --   --   --   --  0.89* 0.85* 0.26*  CREATININE 1.25*  --   --   --   --   --   --   --   --   --   --   TROPONINIHS 1,604*  --   --   --    < > 1,636* 989* 1,275*  --   --   --    < > = values in this interval not displayed.    Assessment: 86yo female subtherapeutic on heparin after rate changes; no gtt issues or signs of bleeding per RN.  Goal of Therapy:  Heparin level 0.3-0.7 units/ml   Plan:  Will increase heparin gtt by 1 unit/kg/hr to 750 units/hr and check level in 8 hours; plan to end heparin therapy at 8p tonight.    Wynona Neat, PharmD, BCPS  10/13/2019,4:36 AM

## 2019-10-13 NOTE — Evaluation (Signed)
Physical Therapy Evaluation and D/ C Patient Details Name: Vickie Young MRN: [id number] DOB: Jan 21, 1924 Today's Date: 10/13/2019   History of Present Illness  86 y.o. female with medical history significant of DM2, HTN, Alzheimer's dementia,  falls. Pt in to ED with concerns for poor appetite, decreased responsiveness. Pt with STEMI, FTT.   Clinical Impression  Pt admitted with above diagnosis. Pt was able to ambulate on unit with min guard to min assist with walking stick and HHA by PT.  Pt is close to baseline and pt wants to take pt home today.  They would like a hospital bed for ease of getting pt OOB and CM was made aware.  HHPT recommended as well.  Will not follow as pt going home today.   Follow Up Recommendations Home health PT;Supervision/Assistance - 24 hour    Equipment Recommendations  Hospital bed (issued gait belts)    Recommendations for Other Services       Precautions / Restrictions Precautions Precautions: Fall Restrictions Weight Bearing Restrictions: No      Mobility  Bed Mobility Overal bed mobility: Needs Assistance Bed Mobility: Supine to Sit     Supine to sit: Min assist     General bed mobility comments: daughter reports she has to have help getting in/out of bed at baseline  Transfers Overall transfer level: Needs assistance Equipment used:  (walking stick) Transfers: Sit to/from Stand Sit to Stand: Min assist;Min guard         General transfer comment: Pt able to use her walking stick in right hand and stand up with slight assit to power up. Family says they are always with pt.   Ambulation/Gait Ambulation/Gait assistance: Min assist Gait Distance (Feet): 110 Feet Assistive device:  (walking stick) Gait Pattern/deviations: Decreased stride length;Step-through pattern;Shuffle;Drifts right/left;Staggering right;Staggering left;Trunk flexed   Gait velocity interpretation: <1.31 ft/sec, indicative of household ambulator General Gait  Details: Pt reaching for furniture with her free hand unless PT offered a hand for her to steady with.  Pt able to ambulate with min assist with gait belt in place.  Pt with no significant LOB.   Stairs            Wheelchair Mobility    Modified Rankin (Stroke Patients Only)       Balance Overall balance assessment: Needs assistance Sitting-balance support: No upper extremity supported;Feet supported Sitting balance-Leahy Scale: Fair     Standing balance support: During functional activity;Single extremity supported Standing balance-Leahy Scale: Poor Standing balance comment: pt really relies on bil UE support.  Family states that walker does not work in house and they like the walking sticks.                              Pertinent Vitals/Pain Pain Assessment: No/denies pain    Home Living Family/patient expects to be discharged to:: Private residence Living Arrangements: Children Available Help at Discharge: Family;Available 24 hours/day Type of Home: House Home Access: Stairs to enter   [employer] of Steps: 1 Home Layout: One level Home Equipment: Cane - single point;Bedside commode;Walker - 2 wheels;Shower seat;Wheelchair - manual (several walking sticks ) Additional Comments: daughters present and pt is going home with one of her daughters and they all provide 24 hour care.      Prior Function Level of Independence: Independent with assistive device(s)   Gait / Transfers Assistance Needed: used cane' furniture walked  ADL's / Homemaking Assistance Needed:  Family assisting with bathing; pt ablet o dress and feed herself        Hand Dominance   Dominant Hand: Right    Extremity/Trunk Assessment   Upper Extremity Assessment Upper Extremity Assessment: Defer to OT evaluation    Lower Extremity Assessment Lower Extremity Assessment: Generalized weakness    Cervical / Trunk Assessment Cervical / Trunk Assessment: Kyphotic   Communication   Communication: HOH  Cognition Arousal/Alertness: Awake/alert Behavior During Therapy: WFL for tasks assessed/performed Overall Cognitive Status: History of cognitive impairments - at baseline                                 General Comments: per daughter pt is at baseline      General Comments General comments (skin integrity, edema, etc.): daughters present and they were educated on use of gait belt and issued gait belts for each daughter.     Exercises     Assessment/Plan    PT Assessment Patent does not need any further PT services;All further PT needs can be met in the next venue of care  PT Problem List Decreased activity tolerance;Decreased balance;Decreased mobility;Decreased knowledge of use of DME;Decreased safety awareness;Decreased knowledge of precautions       PT Treatment Interventions DME instruction;Gait training;Functional mobility training;Therapeutic activities;Therapeutic exercise;Balance training;Patient/family education    PT Goals (Current goals can be found in the Care Plan section)  Acute Rehab PT Goals Patient Stated Goal: family is for pt to return home PT Goal Formulation: All assessment and education complete, DC therapy    Frequency     Barriers to discharge        Co-evaluation               AM-PAC PT "6 Clicks" Mobility  Outcome Measure Help needed turning from your back to your side while in a flat bed without using bedrails?: A Little Help needed moving from lying on your back to sitting on the side of a flat bed without using bedrails?: A Little Help needed moving to and from a bed to a chair (including a wheelchair)?: A Little Help needed standing up from a chair using your arms (e.g., wheelchair or bedside chair)?: A Little Help needed to walk in hospital room?: A Little Help needed climbing 3-5 steps with a railing? : A Little 6 Click Score: 18    End of Session Equipment Utilized During  Treatment: Gait belt Activity Tolerance: Patient limited by fatigue Patient left: in chair;with call bell/phone within reach;with chair alarm set Nurse Communication: Mobility status PT Visit Diagnosis: Unsteadiness on feet (R26.81);Muscle weakness (generalized) (M62.81)    Time: 8016-5537 PT Time Calculation (min) (ACUTE ONLY): 46 min   Charges:   PT Evaluation $PT Eval Moderate Complexity: 1 Mod PT Treatments $Gait Training: 8-22 mins $Self Care/Home Management: 8-22        Theodor Mustin W,PT Acute Rehabilitation Services Pager:  [phone]  Office:  [place] 10/13/2019, 4:31 PM

## 2019-10-14 ENCOUNTER — Telehealth: Payer: Self-pay | Admitting: General Practice

## 2019-10-14 NOTE — Telephone Encounter (Signed)
Patient has TOC appt scheduled with Coletta Memos on 07/074/21 at 2:15 P.m.

## 2019-10-15 DIAGNOSIS — F0280 Dementia in other diseases classified elsewhere without behavioral disturbance: Secondary | ICD-10-CM | POA: Diagnosis not present

## 2019-10-15 DIAGNOSIS — N390 Urinary tract infection, site not specified: Secondary | ICD-10-CM | POA: Diagnosis not present

## 2019-10-15 DIAGNOSIS — I213 ST elevation (STEMI) myocardial infarction of unspecified site: Secondary | ICD-10-CM | POA: Diagnosis not present

## 2019-10-15 DIAGNOSIS — I10 Essential (primary) hypertension: Secondary | ICD-10-CM | POA: Diagnosis not present

## 2019-10-16 DIAGNOSIS — I10 Essential (primary) hypertension: Secondary | ICD-10-CM | POA: Diagnosis not present

## 2019-10-16 DIAGNOSIS — F0280 Dementia in other diseases classified elsewhere without behavioral disturbance: Secondary | ICD-10-CM | POA: Diagnosis not present

## 2019-10-16 DIAGNOSIS — I2102 ST elevation (STEMI) myocardial infarction involving left anterior descending coronary artery: Secondary | ICD-10-CM | POA: Diagnosis not present

## 2019-10-16 DIAGNOSIS — E1121 Type 2 diabetes mellitus with diabetic nephropathy: Secondary | ICD-10-CM | POA: Diagnosis not present

## 2019-10-16 DIAGNOSIS — G309 Alzheimer's disease, unspecified: Secondary | ICD-10-CM | POA: Diagnosis not present

## 2019-10-16 DIAGNOSIS — R296 Repeated falls: Secondary | ICD-10-CM | POA: Diagnosis not present

## 2019-10-16 DIAGNOSIS — D649 Anemia, unspecified: Secondary | ICD-10-CM | POA: Diagnosis not present

## 2019-10-16 DIAGNOSIS — E785 Hyperlipidemia, unspecified: Secondary | ICD-10-CM | POA: Diagnosis not present

## 2019-10-16 DIAGNOSIS — I5189 Other ill-defined heart diseases: Secondary | ICD-10-CM | POA: Diagnosis not present

## 2019-10-17 NOTE — Telephone Encounter (Signed)
Patient contacted regarding discharge from [facility] on 10/13/2019.  Patient understands to follow up with provider J. Cleaver NP on October 23, 2019 at 2:15pm at [facility]. Patient understands discharge instructions? YES Patient understands medications and regiment? YES Patient understands to bring all medications to this visit? YES  Ask patient:  Are you enrolled in [place]

## 2019-10-22 NOTE — Progress Notes (Signed)
Cardiology Clinic Note   Patient Name: Vickie Young Date of Encounter: 10/23/2019  Primary Care Provider:  [name], MD Primary Cardiologist:  Glenetta Hew, MD  Patient Profile    Vickie Young 86 year old female presents to the clinic today for a follow-up evaluation of her angina and dyspnea.  Past Medical History    Past Medical History:  Diagnosis Date  . Breast cancer in female [facility])    remote  . Closed fracture of left distal radius 12/28/2018  . Closed fracture of left proximal humerus 12/28/2018  . Diabetes mellitus without complication ([place])   . Essential hypertension 12/28/2018  . Glaucoma    Past Surgical History:  Procedure Laterality Date  . herniated disc     remote  . MASTECTOMY     remote  . ORIF HUMERUS FRACTURE Left 12/28/2018   Procedure: OPEN REDUCTION INTERNAL FIXATION (ORIF) PROXIMAL HUMERUS FRACTURE;  Surgeon: Marchia Bond, MD;  Location: [place];  Service: Orthopedics;  Laterality: Left;  . ORIF WRIST FRACTURE Left 12/28/2018   Procedure: OPEN REDUCTION INTERNAL FIXATION (ORIF) WRIST FRACTURE;  Surgeon: Marchia Bond, MD;  Location: [place];  Service: Orthopedics;  Laterality: Left;    Allergies  No Known Allergies  History of Present Illness    [name] has a PMH of essential hypertension, STEMI, diabetes mellitus, Alzheimer's, glaucoma, altered mental status.  She was admitted to [facility] on 10/11/2019 through 10/13/2019 with poor appetite and found to have a silent STEMI involving her LAD.  Cardiology was consulted.  A bedside echocardiogram showed overall preserved LV systolic function and very stable regional wall motion abnormalities.  She was noted to have degenerative valve changes involving both mitral and aortic valve, however without major valvular abnormalities, mild to moderate AI.  No pericardial effusions noted.  With the absence of postinfarction angina, no benefit to invasive evaluation or PCI was warranted.   Medical management was felt to be the best option for her continued care.  IV heparin was discontinued on 6/27 and she was sent home on carvedilol, clopidogrel, aspirin, rosuvastatin, Jardiance, losartan, hydrochlorothiazide, irbesartan and her amlodipine was stopped.  She presents to the clinic today for follow-up evaluation with her daughter and states she has been doing well except for right shoulder pain and constipation.  She states that on Monday physical therapy will be coming to the house to do physical therapy with her 2 times per week.  She also states that she has been having trouble with poor appetite and constipation.  She has been giving her black cherry juice which is helped with her constipation.  She states that she will on a soft food and does not hydrate well.  I explained silent STEMI and the area of her heart that had blockage.  We also reviewed her echocardiogram and medications.  I will have her follow-up with Dr. Ellyn Hack in 3 months.  Today she does not have chest pain, shortness of breath, lower extremity edema,  melena, hematuria, hemoptysis, diaphoresis, weakness.    Home Medications    Prior to Admission medications   Medication Sig Start Date End Date Taking? Authorizing Provider  acetaminophen (TYLENOL) 500 MG tablet Take 1 tablet (500 mg total) by mouth every 8 (eight) hours as needed for moderate pain. 01/01/19 01/01/20  Thurnell Lose, MD  aspirin (ASPIRIN EC) 81 MG EC tablet Take 81 mg by mouth daily. Swallow whole.    [provider]  carvedilol (COREG) 3.125 MG tablet Take  1 tablet (3.125 mg total) by mouth 2 (two) times daily with a meal. 10/13/19 11/12/19  Darliss Cheney, MD  clopidogrel (PLAVIX) 75 MG tablet Take 1 tablet (75 mg total) by mouth daily. 10/14/19 11/13/19  Darliss Cheney, MD  COMBIGAN 0.2-0.5 % ophthalmic solution Place 1 drop into both eyes at bedtime.  11/10/14   [provider]  ferrous sulfate 325 (65 FE) MG tablet Take 1 tablet  (325 mg total) by mouth 2 (two) times daily with a meal. 01/01/19   Thurnell Lose, MD  JARDIANCE 10 MG TABS tablet Take 10 mg by mouth daily. 09/26/19   [provider]  losartan-hydrochlorothiazide (HYZAAR) 100-12.5 MG tablet Take 1 tablet by mouth daily. 09/30/19   [provider]  metFORMIN (GLUCOPHAGE) 500 MG tablet Take 500 mg by mouth daily.  09/18/14   [provider]  mirtazapine (REMERON SOL-TAB) 15 MG disintegrating tablet Take 15 mg by mouth at bedtime. 09/26/19   [provider]  Multiple Vitamin (MULTIVITAMIN) capsule Take 1 capsule by mouth daily.    [provider]  polyethylene glycol (MIRALAX) 17 g packet Take 17 g by mouth daily. 01/01/19   Thurnell Lose, MD  rosuvastatin (CRESTOR) 20 MG tablet Take 1 tablet (20 mg total) by mouth daily. 10/14/19 11/13/19  Darliss Cheney, MD  TRADJENTA 5 MG TABS tablet Take 5 mg by mouth daily. 09/05/14   [provider]  Travoprost, BAK Free, (TRAVATAN) 0.004 % SOLN ophthalmic solution Place 1 drop into both eyes at bedtime.    [provider]    Family History    No family history on file. has no family status information on file.   Social History    Social History   Socioeconomic History  . Marital status: Widowed    Spouse name: Not on file  . Number of children: Not on file  . Years of education: Not on file  . Highest education level: Not on file  Occupational History  . Occupation: retired  Tobacco Use  . Smoking status: Never Smoker  . Smokeless tobacco: Current User  Substance and Sexual Activity  . Alcohol use: No  . Drug use: No  . Sexual activity: Not on file  Other Topics Concern  . Not on file  Social History Narrative  . Not on file   Social Determinants of Health   Financial Resource Strain:   . Difficulty of Paying Living Expenses:   Food Insecurity:   . Worried About Charity fundraiser in the Last Year:   . Arboriculturist in the Last Year:    Transportation Needs:   . Film/video editor (Medical):   Marland Kitchen Lack of Transportation (Non-Medical):   Physical Activity:   . Days of Exercise per Week:   . Minutes of Exercise per Session:   Stress:   . Feeling of Stress :   Social Connections:   . Frequency of Communication with Friends and Family:   . Frequency of Social Gatherings with Friends and Family:   . Attends Religious Services:   . Active Member of Clubs or Organizations:   . Attends Archivist Meetings:   Marland Kitchen Marital Status:   Intimate Partner Violence:   . Fear of Current or Ex-Partner:   . Emotionally Abused:   Marland Kitchen Physically Abused:   . Sexually Abused:      Review of Systems    General:  No chills, fever, night sweats or weight changes.  Cardiovascular:  No chest pain, dyspnea on exertion, edema, orthopnea, palpitations, paroxysmal nocturnal dyspnea. Dermatological: No rash, lesions/masses Respiratory: No cough, dyspnea Urologic: No hematuria, dysuria Abdominal:   No nausea, vomiting, diarrhea, bright red blood per rectum, melena, or hematemesis Neurologic:  No visual changes, wkns, changes in mental status. All other systems reviewed and are otherwise negative except as noted above.  Physical Exam    VS:  BP 116/62   Pulse 82   Ht 6' (1.829 m)   Wt 133 lb 9.6 oz (60.6 kg)   SpO2 99%   BMI 18.12 kg/m  , BMI Body mass index is 18.12 kg/m. GEN: Well nourished, well developed, in no acute distress. HEENT: normal. Neck: Supple, no JVD, carotid bruits, or masses. Cardiac: RRR, no murmurs, rubs, or gallops. No clubbing, cyanosis, edema.  Radials/DP/PT 2+ and equal bilaterally.  Respiratory:  Respirations regular and unlabored, clear to auscultation bilaterally. GI: Soft, nontender, nondistended, BS + x 4. MS: no deformity or atrophy. Skin: warm and dry, no rash. Neuro:  Strength and sensation are intact. Psych: Normal affect.  Accessory Clinical Findings    ECG personally reviewed by me  today-none today.  EKG 10/11/2019 Sinus rhythm PACs LBBB 74 bpm  Echocardiogram 10/13/2019 IMPRESSIONS    1. Left ventricular ejection fraction, by estimation, is 60 to 65%. The  left ventricle has normal function. The left ventricle has no regional  wall motion abnormalities. There is moderate concentric left ventricular  hypertrophy. Left ventricular  diastolic parameters are consistent with Grade I diastolic dysfunction  (impaired relaxation).  2. Right ventricular systolic function is normal. The right ventricular  size is normal. Tricuspid regurgitation signal is inadequate for assessing  PA pressure.  3. Left atrial size was mildly dilated.  4. The mitral valve is normal in structure. No evidence of mitral valve  regurgitation. No evidence of mitral stenosis.  5. The aortic valve is normal in structure. Aortic valve regurgitation is  mild. Mild to moderate aortic valve sclerosis/calcification is present,  without any evidence of aortic stenosis.  6. The inferior vena cava is normal in size with greater than 50%  respiratory variability, suggesting right atrial pressure of 3 mmHg.   Assessment & Plan   1.  Silent STEMI-no chest pain today.  LAD/anterior wall, late presentation, absence of postinfarction angina, no benefit to invasive evaluation or PCI was warranted.  Medical management recommended. Continue aspirin, carvedilol, Plavix, Hyzaar, rosuvastatin Heart healthy low-sodium diet-salty 6 given Increase physical activity as tolerated  Essential hypertension-BP today 116/62.  Continue carvedilol, Hyzaar Heart healthy low-sodium diet-salty 6 given Increase physical activity as tolerated  Diabetes mellitus-A1c 7.2 on 10/11/2019 Continue Tradjenta, Jardiance, Metformin Heart healthy low-sodium diet-salty 6 given Increase physical activity as tolerated Followed by PCP  Dementia due to Alzheimer's disease/failure to thrive -presents with her daughter today who is  her primary caregiver.. Managed by PCP   Disposition follow-up with Dr. Ellyn Hack in 3 months.  Jossie Ng. Antavius Sperbeck NP-C    10/23/2019, 3:08 PM [place] Office [phone] Fax [phone]

## 2019-10-23 ENCOUNTER — Ambulatory Visit (INDEPENDENT_AMBULATORY_CARE_PROVIDER_SITE_OTHER): Payer: Medicare Other | Admitting: General Practice

## 2019-10-23 ENCOUNTER — Encounter: Payer: Self-pay | Admitting: General Practice

## 2019-10-23 ENCOUNTER — Other Ambulatory Visit: Payer: Self-pay

## 2019-10-23 VITALS — BP 116/62 | HR 82 | Ht 72.0 in | Wt 133.6 lb

## 2019-10-23 DIAGNOSIS — G309 Alzheimer's disease, unspecified: Secondary | ICD-10-CM | POA: Diagnosis not present

## 2019-10-23 DIAGNOSIS — I2102 ST elevation (STEMI) myocardial infarction involving left anterior descending coronary artery: Secondary | ICD-10-CM

## 2019-10-23 DIAGNOSIS — E119 Type 2 diabetes mellitus without complications: Secondary | ICD-10-CM | POA: Diagnosis not present

## 2019-10-23 DIAGNOSIS — F0280 Dementia in other diseases classified elsewhere without behavioral disturbance: Secondary | ICD-10-CM

## 2019-10-23 DIAGNOSIS — I10 Essential (primary) hypertension: Secondary | ICD-10-CM | POA: Diagnosis not present

## 2019-10-23 NOTE — Patient Instructions (Addendum)
Medication Instructions:  The current medical regimen is effective;  continue present plan and medications as directed. Please refer to the Current Medication list given to you today. *If you need a refill on your cardiac medications before your next appointment, please call your pharmacy*  Special Instructions INCREASE FIBER IN DIET  PLEASE INCREASE PHYSICAL ACTIVITY AS TOLERATED  Follow-Up: Your next appointment:  3 month(s)  In Person with Glenetta Hew, MD  At [facility], you and your health needs are our priority.  As part of our continuing mission to provide you with exceptional heart care, we have created designated Provider Care Teams.  These Care Teams include your primary Cardiologist (physician) and Advanced Practice Providers (APPs -  Physician Assistants and Nurse Practitioners) who all work together to provide you with the care you need, when you need it.

## 2019-10-25 DIAGNOSIS — G309 Alzheimer's disease, unspecified: Secondary | ICD-10-CM | POA: Diagnosis not present

## 2019-10-25 DIAGNOSIS — F0280 Dementia in other diseases classified elsewhere without behavioral disturbance: Secondary | ICD-10-CM | POA: Diagnosis not present

## 2019-10-25 DIAGNOSIS — I10 Essential (primary) hypertension: Secondary | ICD-10-CM | POA: Diagnosis not present

## 2019-10-25 DIAGNOSIS — E1121 Type 2 diabetes mellitus with diabetic nephropathy: Secondary | ICD-10-CM | POA: Diagnosis not present

## 2019-10-25 DIAGNOSIS — I5189 Other ill-defined heart diseases: Secondary | ICD-10-CM | POA: Diagnosis not present

## 2019-10-25 DIAGNOSIS — I2102 ST elevation (STEMI) myocardial infarction involving left anterior descending coronary artery: Secondary | ICD-10-CM | POA: Diagnosis not present

## 2019-10-28 ENCOUNTER — Encounter: Payer: Self-pay | Admitting: Neurology

## 2019-10-28 ENCOUNTER — Other Ambulatory Visit: Payer: Self-pay

## 2019-10-28 ENCOUNTER — Ambulatory Visit (INDEPENDENT_AMBULATORY_CARE_PROVIDER_SITE_OTHER): Payer: Medicare Other | Admitting: Neurology

## 2019-10-28 VITALS — BP 132/63 | HR 71 | Wt 143.0 lb

## 2019-10-28 DIAGNOSIS — I249 Acute ischemic heart disease, unspecified: Secondary | ICD-10-CM

## 2019-10-28 DIAGNOSIS — I10 Essential (primary) hypertension: Secondary | ICD-10-CM | POA: Diagnosis not present

## 2019-10-28 DIAGNOSIS — E1121 Type 2 diabetes mellitus with diabetic nephropathy: Secondary | ICD-10-CM | POA: Diagnosis not present

## 2019-10-28 DIAGNOSIS — F0390 Unspecified dementia without behavioral disturbance: Secondary | ICD-10-CM

## 2019-10-28 DIAGNOSIS — I5189 Other ill-defined heart diseases: Secondary | ICD-10-CM | POA: Diagnosis not present

## 2019-10-28 DIAGNOSIS — I2102 ST elevation (STEMI) myocardial infarction involving left anterior descending coronary artery: Secondary | ICD-10-CM | POA: Diagnosis not present

## 2019-10-28 DIAGNOSIS — G309 Alzheimer's disease, unspecified: Secondary | ICD-10-CM | POA: Diagnosis not present

## 2019-10-28 DIAGNOSIS — F0280 Dementia in other diseases classified elsewhere without behavioral disturbance: Secondary | ICD-10-CM | POA: Diagnosis not present

## 2019-10-28 NOTE — Patient Instructions (Addendum)
MRI of the brain w/wo contrast FDG PET Scan Blood work   Dementia Dementia is a condition that affects the way the brain functions. It often affects memory and thinking. Usually, dementia gets worse with time and cannot be reversed (progressive dementia). There are many types of dementia, including:  Alzheimer's disease. This type is the most common.  Vascular dementia. This type may happen as the result of a stroke.  Lewy body dementia. This type may happen to people who have Parkinson's disease.  Frontotemporal dementia. This type is caused by damage to nerve cells (neurons) in certain parts of the brain. Some people may be affected by more than one type of dementia. This is called mixed dementia. What are the causes? Dementia is caused by damage to cells in the brain. The area of the brain and the types of cells damaged determine the type of dementia. Usually, this damage is irreversible or cannot be undone. Some examples of irreversible causes include:  Conditions that affect the blood vessels of the brain, such as diabetes, heart disease, or blood vessel disease.  Genetic mutations. In some cases, changes in the brain may be caused by another condition and can be reversed or slowed. Some examples of reversible causes include:  Injury to the brain.  Certain medicines.  Infection, such as meningitis.  Metabolic problems, such as vitamin B12 deficiency or thyroid disease.  Pressure on the brain, such as from a tumor or blood clot. What are the signs or symptoms? Symptoms of dementia depend on the type of dementia. Common signs of dementia include problems with remembering, thinking, problem solving, decision making, and communicating. These signs develop slowly or get worse with time. This may include:  Problems remembering things.  Having trouble taking a bath or putting clothes on.  Forgetting appointments.  Forgetting to pay bills.  Difficulty planning and preparing  meals.  Having trouble speaking.  Getting lost easily. How is this diagnosed? This condition is diagnosed by a specialist (neurologist). It is diagnosed based on the history of your symptoms, your medical history, a physical exam, and tests. Tests may include:  Tests to evaluate brain function, such as memory tests, cognitive tests, and other tests.  Lab tests, such as blood or urine tests.  Imaging tests, such as a CT scan, a PET scan, or an MRI.  Genetic testing. This may be done if other family members have a diagnosis of certain types of dementia. Your health care provider will talk with you and your family, friends, or caregivers about your history and symptoms. How is this treated?  Treatment for this condition depends on the cause of the dementia. Progressive dementias, such as Alzheimer's disease, cannot be cured, but there may be treatments that help to manage symptoms. Treatment might involve taking medicines that may help to:  Control the dementia.  Slow down the progression of the dementia.  Manage symptoms. In some cases, treating the cause of your dementia can improve symptoms, reverse symptoms, or slow down how quickly your dementia becomes worse. Your health care provider can direct you to support groups, organizations, and other health care providers who can help with decisions about your care. Follow these instructions at home: Medicines  Take over-the-counter and prescription medicines only as told by your health care provider.  Use a pill organizer or pill reminder to help you manage your medicines.  Avoid taking medicines that can affect thinking, such as pain medicines or sleeping medicines. Lifestyle  Make healthy lifestyle choices. ?  Be physically active as told by your health care provider. ? Do not use any products that contain nicotine or tobacco, such as cigarettes, e-cigarettes, and chewing tobacco. If you need help quitting, ask your health care  provider. ? Do not drink alcohol. ? Practice stress-management techniques when you get stressed. ? Spend time with other people.  Make sure to get quality sleep. These tips can help you get a good night's rest: ? Avoid napping during the day. ? Keep your sleeping area dark and cool. ? Avoid exercising during the few hours before you go to bed. ? Avoid caffeine products in the evening. Eating and drinking  Drink enough fluid to keep your urine pale yellow.  Eat a healthy diet. General instructions   Work with your health care provider to determine what you need help with and what your safety needs are.  Talk with your health care provider about whether it is safe for you to drive.  If you were given a bracelet that identifies you as a person with memory loss or tracks your location, make sure to wear it at all times.  Work with your family to make important decisions, such as advance directives, medical power of attorney, or a living will.  Keep all follow-up visits as told by your health care provider. This is important. Where to find more information  Alzheimer's Association: CapitalMile.co.nz  National Institute on Aging: DVDEnthusiasts.nl  World Health Organization: RoleLink.com.br Contact a health care provider if:  You have any new or worsening symptoms.  You have problems with choking or swallowing. Get help right away if:  You feel depressed or sad, or feel that you want to harm yourself.  Your family members become concerned for your safety. If you ever feel like you may hurt yourself or others, or have thoughts about taking your own life, get help right away. You can go to your nearest emergency department or call:  Your local emergency services (911 in the [place]  A suicide crisis helpline, such as the [place] at (254)811-5275. This is open 24 hours a day. Summary  Dementia is a condition that affects the way the brain  functions. Dementia often affects memory and thinking.  Usually, dementia gets worse with time and cannot be reversed (progressive dementia).  Treatment for this condition depends on the cause of the dementia.  Work with your health care provider to determine what you need help with and what your safety needs are.  Your health care provider can direct you to support groups, organizations, and other health care providers who can help with decisions about your care. This information is not intended to replace advice given to you by your health care provider. Make sure you discuss any questions you have with your health care provider. Document Revised: 06/19/2018 Document Reviewed: 06/19/2018 Elsevier Patient Education  [place].

## 2019-10-28 NOTE — Progress Notes (Signed)
[facility]    Provider:  Dr Jaynee Eagles Requesting Provider: [name], MD Primary Care Provider:  [name], MD  CC:  dementia  HPI:  Vickie Young is a 86 y.o. female here as requested by [name], MD for dementia. PMHx neuropathy, MI, glaucoma, HTN, diabetes, breast cancer, failure to thrive, DM2, HTN, falls, poor appetite, spastic fasciculations of the legs, arms and legs, pain in the right arm, she was admitted recently in the hospital. . She is here with her daughter who would like her assessed for dementia. Mini-Mental status exam is a 87, had a silent MI recently, she believes memory has been tested but no formal diagnosis has been made. She also said the patient is uncontrollable jerky spastic motions from time to time, has trouble finishing sentence, forgets, at first it was just the left hand, could not do anything with it or control it until gone. Another time it was the right arm. Here with her daughter Vickie Young who is her live-in caretaker and provides most information.  Patient cannot provide any history. Daughter would like a diagnosis. She noticed a pronounced decline in memory in the last year. 2-3 years ago she could read parts of the paper but since then a decline. Memory changes ongoing for many years, slowly progressive, she had a fall and the memory changes were accelerated after she was incapacitated she in was in the hospital for 4-5 days and then went to rehab. She was away for a month and never returned to baseline. Phiscially she declines as well, worse in the last several months. At this time patient is significantly impaired, she needs help with ADLs and IADLs and daughter is caretaker. Unknown if she has associated mood disorder.   Reviewed notes, labs and imaging from outside physicians, which showed:  CT head 10/11/2019: personally reviewed images and agree with the following:  COMPARISON:  04/26/2019  FINDINGS: Brain: There is no  acute intracranial hemorrhage, mass effect, or edema. No new loss of gray differentiation. There is no extra-axial fluid collection. Patchy confluent areas of hypoattenuation in the supratentorial white matter are nonspecific but probably reflect stable chronic microvascular ischemic changes. Small chronic cortical infarct of the left precentral gyrus. Prominence of the ventricles and sulci reflects stable parenchymal volume loss.  Vascular: There is atherosclerotic calcification at the skull base.  Skull: Calvarium is unremarkable.  Sinuses/Orbits: No acute finding.  Other: None.  IMPRESSION: No acute intracranial hemorrhage or evidence of acute infarction. Chronic findings detailed above.   Review of Systems: Patient complains of symptoms per HPI as well as the following symptoms: memory loss, physical decline. Pertinent negatives and positives per HPI. All others negative.   Social History   Socioeconomic History  . Marital status: Widowed    Spouse name: Not on file  . Number of children: 6  . Years of education: Not on file  . Highest education level: High school graduate  Occupational History  . Occupation: retired  Tobacco Use  . Smoking status: Never Smoker  . Smokeless tobacco: Current User    Types: Chew  Substance and Sexual Activity  . Alcohol use: No  . Drug use: No  . Sexual activity: Not on file  Other Topics Concern  . Not on file  Social History Narrative   Lives at home with daughter   Caffeine: drinks decaf coffee occasionally   Social Determinants of Health   Financial Resource Strain:   . Difficulty of Paying Living Expenses:  Food Insecurity:   . Worried About Charity fundraiser in the Last Year:   . Arboriculturist in the Last Year:   Transportation Needs:   . Film/video editor (Medical):   Marland Kitchen Lack of Transportation (Non-Medical):   Physical Activity:   . Days of Exercise per Week:   . Minutes of Exercise per Session:     Stress:   . Feeling of Stress :   Social Connections:   . Frequency of Communication with Friends and Family:   . Frequency of Social Gatherings with Friends and Family:   . Attends Religious Services:   . Active Member of Clubs or Organizations:   . Attends Archivist Meetings:   Marland Kitchen Marital Status:   Intimate Partner Violence:   . Fear of Current or Ex-Partner:   . Emotionally Abused:   Marland Kitchen Physically Abused:   . Sexually Abused:     Family History  Problem Relation Age of Onset  . Heart attack Mother   . Stroke Father        not sure, but daughter thinks   . Lung cancer Son   . Lung cancer Daughter   . Cancer Sister   . Dementia Neg Hx     Past Medical History:  Diagnosis Date  . Breast cancer in female [facility])    remote  . Closed fracture of left distal radius 12/28/2018  . Closed fracture of left proximal humerus 12/28/2018  . Diabetes ([place])   . Diabetes mellitus without complication ([place])   . Essential hypertension 12/28/2018  . Glaucoma   . Heart attack ([place])   . Neuropathy     Patient Active Problem List   Diagnosis Date Noted  . Altered mental status 10/11/2019  . Dementia due to Alzheimer's disease ([place]) 10/11/2019  . Failure to thrive in adult 10/11/2019  . Silent ST elevation myocardial infarction (STEMI) involving left anterior descending (LAD) coronary artery of anterior wall ([place]) 10/11/2019  . Humerus fracture 12/29/2018  . Closed fracture of left proximal humerus 12/28/2018  . Closed fracture of left distal radius 12/28/2018  . Essential hypertension 12/28/2018  . Glaucoma   . Diabetes mellitus without complication [facility])     Past Surgical History:  Procedure Laterality Date  . herniated disc     remote  . MASTECTOMY     remote  . ORIF HUMERUS FRACTURE Left 12/28/2018   Procedure: OPEN REDUCTION INTERNAL FIXATION (ORIF) PROXIMAL HUMERUS FRACTURE;  Surgeon: Marchia Bond, MD;  Location: [place];  Service: Orthopedics;  Laterality: Left;   . ORIF WRIST FRACTURE Left 12/28/2018   Procedure: OPEN REDUCTION INTERNAL FIXATION (ORIF) WRIST FRACTURE;  Surgeon: Marchia Bond, MD;  Location: [place];  Service: Orthopedics;  Laterality: Left;    Current Outpatient Medications  Medication Sig Dispense Refill  . acetaminophen (TYLENOL) 500 MG tablet Take 1 tablet (500 mg total) by mouth every 8 (eight) hours as needed for moderate pain. 20 tablet 0  . aspirin (ASPIRIN EC) 81 MG EC tablet Take 81 mg by mouth daily. Swallow whole.    . carvedilol (COREG) 3.125 MG tablet Take 1 tablet (3.125 mg total) by mouth 2 (two) times daily with a meal. 60 tablet 0  . clopidogrel (PLAVIX) 75 MG tablet Take 1 tablet (75 mg total) by mouth daily. 30 tablet 0  . COMBIGAN 0.2-0.5 % ophthalmic solution Place 1 drop into both eyes at bedtime.   4  . ferrous sulfate 325 (65 FE) MG  tablet Take 1 tablet (325 mg total) by mouth 2 (two) times daily with a meal.  3  . JARDIANCE 10 MG TABS tablet Take 10 mg by mouth daily.    Marland Kitchen losartan-hydrochlorothiazide (HYZAAR) 100-12.5 MG tablet Take 1 tablet by mouth daily.    . metFORMIN (GLUCOPHAGE) 500 MG tablet Take 500 mg by mouth daily.   3  . Multiple Vitamin (MULTIVITAMIN) capsule Take 1 capsule by mouth daily.    . rosuvastatin (CRESTOR) 20 MG tablet Take 1 tablet (20 mg total) by mouth daily. 30 tablet 0  . TRADJENTA 5 MG TABS tablet Take 5 mg by mouth daily.  99  . Travoprost, BAK Free, (TRAVATAN) 0.004 % SOLN ophthalmic solution Place 1 drop into both eyes at bedtime.     No current facility-administered medications for this visit.    Allergies as of 10/28/2019  . (No Known Allergies)    Vitals: BP 132/63 (BP Location: Left Arm, Patient Position: Sitting)   Pulse 71   Wt 143 lb (64.9 kg)   BMI 19.39 kg/m  Last Weight:  Wt Readings from Last 1 Encounters:  10/28/19 143 lb (64.9 kg)   Last Height:   Ht Readings from Last 1 Encounters:  10/23/19 6' (1.829 m)     Physical exam: Exam: Gen: NAD,  frail older woman                 CV: RRR, no MRG. No Carotid Bruits. No peripheral edema, warm, nontender Eyes: Conjunctivae clear without exudates or hemorrhage  Neuro: Detailed Neurologic Exam  Speech:    Speech is normal; fluent, not spontaneous with impaired comprehension.  Cognition: MMSE - Mini Mental State Exam 10/28/2019  Orientation to time 0  Orientation to Place 1  Registration 3  Attention/ Calculation 0  Recall 0  Language- name 2 objects 2  Language- repeat 1  Language- follow 3 step command 1  Language- read & follow direction 1  Write a sentence 0  Copy design 0  Total score 9    Cranial Nerves:    The pupils are equal, round, and reactive to light. Attempted fundoscopy could not visualize due to small pupils. Blinks to threat on the right, not on the left. Extraocular movements are intact. Trigeminal sensation is intact. The face is symmetric. The palate elevates in the midline. Hearing impaired to voice. Voice is normal. Shoulder shrug is normal. The tongue has normal motion without fasciculations.   Coordination: She does not appear tohave any ataxia or dysmetria   Gait: Cannot stand without assistance  Motor Observation:    Frail, generalized atrophy  Tone:    No increased tone appreciated    Posture:    Posture is slumped in wheelchair    Strength: Not moving right arm due to pain in the shoulder (they are seeing ortho tomorrow), all other extremities are anti-gravity without drift     Sensation: intact to LT in all extremities     Reflex Exam:  DTR's:    Deep tendon reflexes in the upper and lower extremities are symmetrical bilaterally.  Not brisk. Toes:    The toes are equivocal bilaterally.   Clonus:    Clonus is absent.    Assessment/Plan:  86 y.o. female here as requested by [name], MD for dementia. PMHx neuropathy, MI, glaucoma, HTN, diabetes, breast cancer, failure to thrive, DM2, HTN, falls, poor appetite, spastic  fasciculations of the legs, arms and legs, pain in the right arm.  -Patient  with advanced dementia MMSE 9 out of 30.  I discussed with daughter that it is very difficult at this point in her advanced state to differentiate between the different types of dementia, she does appear to have significant white matter changes on CT of the head indicating possible vascular origin however cannot rule out Alzheimer's.  Too advanced for any sort of formal memory testing.  Any medication we may try will be unlikely to make any sort of clinical difference.  However patient's daughter would  still like to go forward with a work-up to get a better diagnosis of what kind of dementia patient has.  I explained it could be a mixed type dementia and again at this late stage medications are and not likely to make any sort of clinical difference.  We can order an MRI of the brain to evaluate for vascular changes and atrophy patterns and we can also try to order an FDG PET scan if insurance will approve to help differentiate between the different types of dementia patient could have.  Orders Placed This Encounter  Procedures  . MR BRAIN W WO CONTRAST  . NM PET Metabolic Brain  . Vitamin B1  . B12 and Folate Panel  . Methylmalonic acid, serum  . Homocysteine  . TSH  . RPR    Cc: [name], MD,    Sarina Ill, MD  [facility] [address] [place], [place] [address]  Phone [phone] Fax [phone]

## 2019-10-29 ENCOUNTER — Encounter: Payer: Self-pay | Admitting: Neurology

## 2019-10-29 ENCOUNTER — Telehealth: Payer: Self-pay | Admitting: Neurology

## 2019-10-29 NOTE — Telephone Encounter (Signed)
Medicare/cigna order sent to GI. They will obtain the auth for cigna and reach out to the patient to schedule.  

## 2019-10-30 DIAGNOSIS — E1121 Type 2 diabetes mellitus with diabetic nephropathy: Secondary | ICD-10-CM | POA: Diagnosis not present

## 2019-10-30 DIAGNOSIS — I2102 ST elevation (STEMI) myocardial infarction involving left anterior descending coronary artery: Secondary | ICD-10-CM | POA: Diagnosis not present

## 2019-10-30 DIAGNOSIS — G309 Alzheimer's disease, unspecified: Secondary | ICD-10-CM | POA: Diagnosis not present

## 2019-10-30 DIAGNOSIS — F0280 Dementia in other diseases classified elsewhere without behavioral disturbance: Secondary | ICD-10-CM | POA: Diagnosis not present

## 2019-10-30 DIAGNOSIS — I5189 Other ill-defined heart diseases: Secondary | ICD-10-CM | POA: Diagnosis not present

## 2019-10-30 DIAGNOSIS — I10 Essential (primary) hypertension: Secondary | ICD-10-CM | POA: Diagnosis not present

## 2019-10-31 ENCOUNTER — Telehealth: Payer: Self-pay | Admitting: Neurology

## 2019-10-31 LAB — B12 AND FOLATE PANEL
Folate: >20 ng/mL (ref >3.0)
Vitamin B-12: 1135 pg/mL (ref 232–1245)

## 2019-10-31 LAB — VITAMIN B1: Thiamine: 187.9 nmol/L (ref 66.5–200.0)

## 2019-10-31 LAB — HOMOCYSTEINE: Homocysteine: 20.6 umol/L (ref 0.0–21.3)

## 2019-10-31 LAB — METHYLMALONIC ACID, SERUM: Methylmalonic Acid: 216 nmol/L (ref 0–378)

## 2019-10-31 LAB — RPR: RPR Ser Ql: NONREACTIVE

## 2019-10-31 LAB — TSH: TSH: 1.45 u[IU]/mL (ref 0.450–4.500)

## 2019-11-04 DIAGNOSIS — I10 Essential (primary) hypertension: Secondary | ICD-10-CM | POA: Diagnosis not present

## 2019-11-04 DIAGNOSIS — G309 Alzheimer's disease, unspecified: Secondary | ICD-10-CM | POA: Diagnosis not present

## 2019-11-04 DIAGNOSIS — I2102 ST elevation (STEMI) myocardial infarction involving left anterior descending coronary artery: Secondary | ICD-10-CM | POA: Diagnosis not present

## 2019-11-04 DIAGNOSIS — F0280 Dementia in other diseases classified elsewhere without behavioral disturbance: Secondary | ICD-10-CM | POA: Diagnosis not present

## 2019-11-05 ENCOUNTER — Other Ambulatory Visit: Payer: Self-pay | Admitting: Student

## 2019-11-05 NOTE — Telephone Encounter (Signed)
Error

## 2019-11-07 DIAGNOSIS — I10 Essential (primary) hypertension: Secondary | ICD-10-CM | POA: Diagnosis not present

## 2019-11-07 DIAGNOSIS — E11319 Type 2 diabetes mellitus with unspecified diabetic retinopathy without macular edema: Secondary | ICD-10-CM | POA: Diagnosis not present

## 2019-11-07 DIAGNOSIS — D649 Anemia, unspecified: Secondary | ICD-10-CM | POA: Diagnosis not present

## 2019-11-07 DIAGNOSIS — E1121 Type 2 diabetes mellitus with diabetic nephropathy: Secondary | ICD-10-CM | POA: Diagnosis not present

## 2019-11-07 DIAGNOSIS — I213 ST elevation (STEMI) myocardial infarction of unspecified site: Secondary | ICD-10-CM | POA: Diagnosis not present

## 2019-11-07 DIAGNOSIS — F0390 Unspecified dementia without behavioral disturbance: Secondary | ICD-10-CM | POA: Diagnosis not present

## 2019-11-08 DIAGNOSIS — R790 Abnormal level of blood mineral: Secondary | ICD-10-CM | POA: Diagnosis not present

## 2019-11-09 ENCOUNTER — Ambulatory Visit
Admission: RE | Admit: 2019-11-09 | Discharge: 2019-11-09 | Disposition: A | Discharge status: Home / Self Care | Payer: Medicare Other | Source: Ambulatory Visit | Attending: Neurology | Admitting: Neurology

## 2019-11-09 DIAGNOSIS — F0390 Unspecified dementia without behavioral disturbance: Secondary | ICD-10-CM

## 2019-11-09 MED ORDER — GADOBENATE DIMEGLUMINE 529 MG/ML IV SOLN
13.0000 mL | Freq: Once | INTRAVENOUS | Status: AC | PRN
  Administered 2019-11-09: 13 mL via INTRAVENOUS

## 2019-11-11 DIAGNOSIS — M25511 Pain in right shoulder: Secondary | ICD-10-CM | POA: Diagnosis not present

## 2019-11-12 ENCOUNTER — Telehealth: Payer: Self-pay | Admitting: *Deleted

## 2019-11-12 NOTE — Telephone Encounter (Signed)
-----   Message from Melvenia Beam, MD sent at 11/10/2019  9:15 AM EDT ----- MRI of the brain shows significant/advanced cerebrovascular disease such as blocked small arteries and strokes indicative of VASCULAR DEMENTIA. However there are also signs of possible ALZHEIMER's dementia. It is not uncommon to have multiple types of dementia and I suspect Vickie Young may have both. We can discuss at next appointment but the brain MRI is consistent with both Vascular and Alzheimer's components of Dementia.

## 2019-11-12 NOTE — Telephone Encounter (Signed)
Called pt's daughter [name] (on [place]) and LVM asking for call back during office hours to discuss pt's MRI results. Left office number in message.

## 2019-11-14 NOTE — Telephone Encounter (Addendum)
Spoke with pt's daughter Vickie Young. We discussed the MRI brain results from Dr. Jaynee Eagles. She had multiple questions which we discussed. She will be in touch with GI about getting a copy of the CD. Her sister is POA. FDG pet scan was ordered and is pending authorization. I let her know I would send a message to staff to check on status. She asked about getting copies of the report. I let her know I would send a message to medical records about this. She initially asked about being placed on wait list for the f/u with Dr Jaynee Eagles but when we discussed FDG pet scan pending she decided to hold on being placed on wait list. She asked if Dr. Jaynee Eagles knew of any appetite stimulants. I suggested she call PCP to discuss as this is not prescribed/managed here but she stated she wanted to know if Dr. Jaynee Eagles had an opinion on one and not to prescribe. I let her know I would ask and call her back. She verbalized appreciation for the call.  Update: spoke with Dr. Jaynee Eagles. She had nothing to add about appetite stimulants. Defer to PCP.

## 2019-11-15 ENCOUNTER — Ambulatory Visit: Payer: Medicare Other | Admitting: Cardiology

## 2019-11-15 DIAGNOSIS — E785 Hyperlipidemia, unspecified: Secondary | ICD-10-CM | POA: Diagnosis not present

## 2019-11-15 DIAGNOSIS — F0280 Dementia in other diseases classified elsewhere without behavioral disturbance: Secondary | ICD-10-CM | POA: Diagnosis not present

## 2019-11-15 DIAGNOSIS — G309 Alzheimer's disease, unspecified: Secondary | ICD-10-CM | POA: Diagnosis not present

## 2019-11-15 DIAGNOSIS — D649 Anemia, unspecified: Secondary | ICD-10-CM | POA: Diagnosis not present

## 2019-11-15 DIAGNOSIS — R296 Repeated falls: Secondary | ICD-10-CM | POA: Diagnosis not present

## 2019-11-15 DIAGNOSIS — I5189 Other ill-defined heart diseases: Secondary | ICD-10-CM | POA: Diagnosis not present

## 2019-11-15 DIAGNOSIS — I2102 ST elevation (STEMI) myocardial infarction involving left anterior descending coronary artery: Secondary | ICD-10-CM | POA: Diagnosis not present

## 2019-11-15 DIAGNOSIS — E1121 Type 2 diabetes mellitus with diabetic nephropathy: Secondary | ICD-10-CM | POA: Diagnosis not present

## 2019-11-15 DIAGNOSIS — I10 Essential (primary) hypertension: Secondary | ICD-10-CM | POA: Diagnosis not present

## 2019-11-18 NOTE — Telephone Encounter (Signed)
I called Vickie Young (on [place]) back and LVM (ok per DPR) advising Dr. Jaynee Eagles does not know of any appetite stimulants and advises they speak with pt's primary care doctor. Left office number in message if she has further questions. Also let her know I had messaged staff about getting MRI report to them and checking status on FDG pet scan.

## 2019-11-21 ENCOUNTER — Telehealth: Payer: Self-pay | Admitting: Cardiology

## 2019-11-21 MED ORDER — CARVEDILOL 3.125 MG PO TABS: 3.1250 mg | ORAL_TABLET | Freq: Two times a day (BID) | ORAL | 3 refills | Status: AC

## 2019-11-21 MED ORDER — ROSUVASTATIN CALCIUM 20 MG PO TABS: 20.0000 mg | ORAL_TABLET | Freq: Every day | ORAL | 3 refills | Status: AC

## 2019-11-21 MED ORDER — CLOPIDOGREL BISULFATE 75 MG PO TABS
75.0000 mg | ORAL_TABLET | Freq: Every day | ORAL | 3 refills | Status: DC
Start: 2019-11-21 — End: 2020-01-30

## 2019-11-21 NOTE — Telephone Encounter (Signed)
Spoke with pt daughter, aware refills sent to the pharmacy.

## 2019-11-21 NOTE — Telephone Encounter (Signed)
°  Daughter is calling because patient was placed on carvedilol (COREG) 3.125 MG tablet, rosuvastatin (CRESTOR) 20 MG tablet and Clopidogrel 75 mg while she was in the hospital. The prescriptions are expiring and they would like to know if the patient needs to continue on these medications. If so, they will need refills sent to [facility] on [address] They are requesting 90 day supply. Please contact daughter Vickie Young to let her know the decision on these medications.

## 2019-11-21 NOTE — Telephone Encounter (Signed)
Called and spoke to daughter with all details.

## 2019-11-21 NOTE — Telephone Encounter (Signed)
Called patient and left her a message asking her to call me back Patient's mother is scheduled for a PET scn at [facility] 08/13/201 arrive at 8:30 am  NPO 6 hours before  Hold Diabetes RX if patient is taking . Patient can bring and take after.  Patient can take her other RX's with water before apt.  If apt date does not work [phone] Reschedule .

## 2019-11-26 DIAGNOSIS — M25511 Pain in right shoulder: Secondary | ICD-10-CM | POA: Diagnosis not present

## 2019-11-29 ENCOUNTER — Other Ambulatory Visit: Payer: Self-pay

## 2019-11-29 ENCOUNTER — Ambulatory Visit (HOSPITAL_COMMUNITY)
Admission: RE | Admit: 2019-11-29 | Discharge: 2019-11-29 | Disposition: A | Discharge status: Home / Self Care | Payer: Medicare Other | Source: Ambulatory Visit | Attending: Neurology | Admitting: Neurology

## 2019-11-29 DIAGNOSIS — G3189 Other specified degenerative diseases of nervous system: Secondary | ICD-10-CM | POA: Diagnosis not present

## 2019-11-29 DIAGNOSIS — F0390 Unspecified dementia without behavioral disturbance: Secondary | ICD-10-CM | POA: Diagnosis not present

## 2019-11-29 DIAGNOSIS — G319 Degenerative disease of nervous system, unspecified: Secondary | ICD-10-CM | POA: Diagnosis not present

## 2019-11-29 LAB — GLUCOSE, CAPILLARY: Glucose-Capillary: 233 mg/dL — ABNORMAL HIGH (ref 70–99)

## 2019-11-29 MED ORDER — FLUDEOXYGLUCOSE F - 18 (FDG) INJECTION
9.0000 | Freq: Once | INTRAVENOUS | Status: AC | PRN
  Administered 2019-11-29: 9 via INTRAVENOUS

## 2019-12-04 ENCOUNTER — Telehealth: Payer: Self-pay | Admitting: Neurology

## 2019-12-04 NOTE — Telephone Encounter (Signed)
Patient's daughter is calling and she would like to come in for sooner apt to go over PET scan results with Dr. Jaynee Eagles.  Foloow up is scheduled  In October.

## 2019-12-05 NOTE — Telephone Encounter (Signed)
I called the pt's daughter [name] (on [place]) back and LVM (ok per [facility]) offering a video visit next Monday 8/23 @ 11:00 AM. I offered video per Dr Jaynee Eagles due to concerns with covid and delta variant. I asked for a call back to let us know. Left office number and hours in message.

## 2019-12-05 NOTE — Telephone Encounter (Signed)
Spoke with Dr Jaynee Eagles. We can offer a video visit so patient does not have to come in due to the delta variant of COVID19. We currently have an opening next Monday 8/23 AM.

## 2019-12-05 NOTE — Telephone Encounter (Signed)
Spoke with Dr. Jaynee Eagles and she is fine with pt coming in. I called Barbara back. Pt scheduled for in-office appt this coming Monday 12/09/19 at 11:00 AM arrival about 15 minutes early. She verbalized appreciation for the call and we agreed that October follow up should be canceled since pt is being seen for the same reason early.

## 2019-12-05 NOTE — Telephone Encounter (Signed)
pt's daughter called back, message from [facility] was read to her.  Daughter declined virtual and is asking for a call from [place], [place] on Monday

## 2019-12-09 ENCOUNTER — Encounter: Payer: Self-pay | Admitting: Neurology

## 2019-12-09 ENCOUNTER — Telehealth: Payer: Self-pay | Admitting: Neurology

## 2019-12-09 ENCOUNTER — Ambulatory Visit (INDEPENDENT_AMBULATORY_CARE_PROVIDER_SITE_OTHER): Payer: Medicare Other | Admitting: Neurology

## 2019-12-09 VITALS — BP 112/50 | HR 67 | Ht 69.0 in | Wt 137.0 lb

## 2019-12-09 DIAGNOSIS — F0150 Vascular dementia without behavioral disturbance: Secondary | ICD-10-CM

## 2019-12-09 DIAGNOSIS — I249 Acute ischemic heart disease, unspecified: Secondary | ICD-10-CM

## 2019-12-09 DIAGNOSIS — E1121 Type 2 diabetes mellitus with diabetic nephropathy: Secondary | ICD-10-CM | POA: Diagnosis not present

## 2019-12-09 DIAGNOSIS — F0280 Dementia in other diseases classified elsewhere without behavioral disturbance: Secondary | ICD-10-CM | POA: Diagnosis not present

## 2019-12-09 DIAGNOSIS — I5189 Other ill-defined heart diseases: Secondary | ICD-10-CM | POA: Diagnosis not present

## 2019-12-09 DIAGNOSIS — E854 Organ-limited amyloidosis: Secondary | ICD-10-CM | POA: Diagnosis not present

## 2019-12-09 DIAGNOSIS — G309 Alzheimer's disease, unspecified: Secondary | ICD-10-CM

## 2019-12-09 DIAGNOSIS — I680 Cerebral amyloid angiopathy: Secondary | ICD-10-CM

## 2019-12-09 DIAGNOSIS — I2102 ST elevation (STEMI) myocardial infarction involving left anterior descending coronary artery: Secondary | ICD-10-CM | POA: Diagnosis not present

## 2019-12-09 DIAGNOSIS — I10 Essential (primary) hypertension: Secondary | ICD-10-CM | POA: Diagnosis not present

## 2019-12-09 MED ORDER — CITALOPRAM HYDROBROMIDE 10 MG PO TABS: 10.0000 mg | ORAL_TABLET | Freq: Every day | ORAL | 6 refills | Status: AC

## 2019-12-09 NOTE — Patient Instructions (Addendum)
For constipation: try the "prune juice cocktail". Mix 1/2 cup applesauce, 2 tablespoons of miller's bran, 4-6 oz. prune juice. Store in Multimedia programmer. Take a tablespoonful per day at first, gradually increasing until you find the amount that works best. Most people find this mixture tasty.     Citalopram tablets What is this medicine? CITALOPRAM (sye TAL oh pram) is a medicine for depression. This medicine may be used for other purposes; ask your health care provider or pharmacist if you have questions. COMMON BRAND NAME(S): Celexa What should I tell my health care provider before I take this medicine? They need to know if you have any of these conditions:  bleeding disorders  bipolar disorder or a family history of bipolar disorder  glaucoma  heart disease  history of irregular heartbeat  kidney disease  liver disease  low levels of magnesium or potassium in the blood  receiving electroconvulsive therapy  seizures  suicidal thoughts, plans, or attempt; a previous suicide attempt by you or a family member  take medicines that treat or prevent blood clots  thyroid disease  an unusual or allergic reaction to citalopram, escitalopram, other medicines, foods, dyes, or preservatives  pregnant or trying to become pregnant  breast-feeding How should I use this medicine? Take this medicine by mouth with a glass of water. Follow the directions on the prescription label. You can take it with or without food. Take your medicine at regular intervals. Do not take your medicine more often than directed. Do not stop taking this medicine suddenly except upon the advice of your doctor. Stopping this medicine too quickly may cause serious side effects or your condition may worsen. A special MedGuide will be given to you by the pharmacist with each prescription and refill. Be sure to read this information carefully each time. Talk to your pediatrician regarding the use of this medicine in  children. Special care may be needed. Patients over 76 years old may have a stronger reaction and need a smaller dose. Overdosage: If you think you have taken too much of this medicine contact a poison control center or emergency room at once. NOTE: This medicine is only for you. Do not share this medicine with others. What if I miss a dose? If you miss a dose, take it as soon as you can. If it is almost time for your next dose, take only that dose. Do not take double or extra doses. What may interact with this medicine? Do not take this medicine with any of the following medications:  certain medicines for fungal infections like fluconazole, itraconazole, ketoconazole, posaconazole, voriconazole  cisapride  dronedarone  escitalopram  linezolid  MAOIs like Carbex, Eldepryl, Marplan, Nardil, and Parnate  methylene blue (injected into a vein)  pimozide  thioridazine This medicine may also interact with the following medications:  alcohol  amphetamines  aspirin and aspirin-like medicines  carbamazepine  certain medicines for depression, anxiety, or psychotic disturbances  certain medicines for infections like chloroquine, clarithromycin, erythromycin, furazolidone, isoniazid, pentamidine  certain medicines for migraine headaches like almotriptan, eletriptan, frovatriptan, naratriptan, rizatriptan, sumatriptan, zolmitriptan  certain medicines for sleep  certain medicines that treat or prevent blood clots like dalteparin, enoxaparin, warfarin  cimetidine  diuretics  dofetilide  fentanyl  lithium  methadone  metoprolol  NSAIDs, medicines for pain and inflammation, like ibuprofen or naproxen  omeprazole  other medicines that prolong the QT interval (cause an abnormal heart rhythm)  procarbazine  rasagiline  supplements like St. John's wort, kava kava, valerian  tramadol  tryptophan  ziprasidone This list may not describe all possible interactions.  Give your health care provider a list of all the medicines, herbs, non-prescription drugs, or dietary supplements you use. Also tell them if you smoke, drink alcohol, or use illegal drugs. Some items may interact with your medicine. What should I watch for while using this medicine? Tell your doctor if your symptoms do not get better or if they get worse. Visit your doctor or health care professional for regular checks on your progress. Because it may take several weeks to see the full effects of this medicine, it is important to continue your treatment as prescribed by your doctor. Patients and their families should watch out for new or worsening thoughts of suicide or depression. Also watch out for sudden changes in feelings such as feeling anxious, agitated, panicky, irritable, hostile, aggressive, impulsive, severely restless, overly excited and hyperactive, or not being able to sleep. If this happens, especially at the beginning of treatment or after a change in dose, call your health care professional. Dennis Bast may get drowsy or dizzy. Do not drive, use machinery, or do anything that needs mental alertness until you know how this medicine affects you. Do not stand or sit up quickly, especially if you are an older patient. This reduces the risk of dizzy or fainting spells. Alcohol may interfere with the effect of this medicine. Avoid alcoholic drinks. Your mouth may get dry. Chewing sugarless gum or sucking hard candy, and drinking plenty of water will help. Contact your doctor if the problem does not go away or is severe. What side effects may I notice from receiving this medicine? Side effects that you should report to your doctor or health care professional as soon as possible:  allergic reactions like skin rash, itching or hives, swelling of the face, lips, or tongue  anxious  black, tarry stools  breathing problems  changes in vision  chest pain  confusion  elevated mood, decreased need  for sleep, racing thoughts, impulsive behavior  eye pain  fast, irregular heartbeat  feeling faint or lightheaded, falls  feeling agitated, angry, or irritable  hallucination, loss of contact with reality  loss of balance or coordination  loss of memory  painful or prolonged erections  restlessness, pacing, inability to keep still  seizures  stiff muscles  suicidal thoughts or other mood changes  trouble sleeping  unusual bleeding or bruising  unusually weak or tired  vomiting Side effects that usually do not require medical attention (report to your doctor or health care professional if they continue or are bothersome):  change in appetite or weight  change in sex drive or performance  dizziness  headache  increased sweating  indigestion, nausea  tremors This list may not describe all possible side effects. Call your doctor for medical advice about side effects. You may report side effects to FDA at 1-800-FDA-1088. Where should I keep my medicine? Keep out of reach of children. Store at room temperature between 15 and 30 degrees C (59 and 86 degrees F). Throw away any unused medicine after the expiration date. NOTE: This sheet is a summary. It may not cover all possible information. If you have questions about this medicine, talk to your doctor, pharmacist, or health care provider.  2020 Elsevier/Gold Standard (2018-03-26 09:05:36)

## 2019-12-09 NOTE — Progress Notes (Signed)
[facility]    Provider:  Dr Jaynee Eagles Requesting Provider: [name], MD Primary Care Provider:  [name], MD  CC:  Dementia, Mixed Alzheimer's and Vascular  Interval history 12/09/2019: Here for follow up, PET Scan showed probable Alzheimer's disease. MRI of the brain had signs of both Vascular dementia and Alzheimer's dementia with lacunar infarcts and cerebral amyloid angiopathy.  She is advanced, MMSE 9/30, I do not think medications such as Aricept or Namenda at this point will be clinically effective and may only cause side effects. We discussed dementia and Alzheimer's disease as well as vascular dementia, microhemorrhages due to amyloid in the brain which is associated with alzheimers disease. We reviewed images together. She has crying spells. They come with worsening frequency, she will descend into crying spells, inability to sleep. She will wake up every 3 hours and become more pronounced. She is trying melatonin. She has chronic constipation. She is on ASA and plavix and discussed increased bleeding risk with her microangiopathy in the brain and microhemorrhages but she also had MI will email her cardiologist. Can have the crying fits very often. She refuses to eat sometimes and she can cry at things likenot washing her hands. Sometimes she has difficulty moving her legs. Discussed apraxia, pseudobukbar affect, answered all questions.   HPI:  Vickie Young is a 86 y.o. female here as requested by [name], MD for dementia. PMHx neuropathy, MI, glaucoma, HTN, diabetes, breast cancer, failure to thrive, DM2, HTN, falls, poor appetite, spastic fasciculations of the legs, arms and legs, pain in the right arm, she was admitted recently in the hospital. . She is here with her daughter who would like her assessed for dementia. Mini-Mental status exam is a 37, had a silent MI recently, she believes memory has been tested but no formal diagnosis has been made. She  also said the patient is uncontrollable jerky spastic motions from time to time, has trouble finishing sentence, forgets, at first it was just the left hand, could not do anything with it or control it until gone. Another time it was the right arm. Here with her daughter [name] who is her live-in caretaker and provides most information.  Patient cannot provide any history. Daughter would like a diagnosis. She noticed a pronounced decline in memory in the last year. 2-3 years ago she could read parts of the paper but since then a decline. Memory changes ongoing for many years, slowly progressive, she had a fall and the memory changes were accelerated after she was incapacitated she in was in the hospital for 4-5 days and then went to rehab. She was away for a month and never returned to baseline. Phiscially she declines as well, worse in the last several months. At this time patient is significantly impaired, she needs help with ADLs and IADLs and daughter is caretaker. Unknown if she has associated mood disorder.   Reviewed notes, labs and imaging from outside physicians, which showed:  CT head 10/11/2019: personally reviewed images and agree with the following:  COMPARISON:  04/26/2019  FINDINGS: Brain: There is no acute intracranial hemorrhage, mass effect, or edema. No new loss of gray differentiation. There is no extra-axial fluid collection. Patchy confluent areas of hypoattenuation in the supratentorial white matter are nonspecific but probably reflect stable chronic microvascular ischemic changes. Small chronic cortical infarct of the left precentral gyrus. Prominence of the ventricles and sulci reflects stable parenchymal volume loss.  Vascular: There is atherosclerotic calcification at the skull base.  Skull: Calvarium is unremarkable.  Sinuses/Orbits: No acute finding.  Other: None.  IMPRESSION: No acute intracranial hemorrhage or evidence of acute infarction. Chronic  findings detailed above.   Review of Systems: Patient complains of symptoms per HPI as well as the following symptoms: memory loss, physical decline. Pertinent negatives and positives per HPI. All others negative.   Social History   Socioeconomic History   Marital status: Widowed    Spouse name: Not on file   Number of children: 6   Years of education: Not on file   Highest education level: High school graduate  Occupational History   Occupation: retired  Tobacco Use   Smoking status: Never Smoker   Smokeless tobacco: Current User    Types: Chew  Substance and Sexual Activity   Alcohol use: No   Drug use: No   Sexual activity: Not on file  Other Topics Concern   Not on file  Social History Narrative   Lives at home. Her daughter [name] lives with her.    Caffeine: drinks decaf coffee occasionally   Social Determinants of Health   Financial Resource Strain:    Difficulty of Paying Living Expenses: Not on file  Food Insecurity:    Worried About Charity fundraiser in the Last Year: Not on file   [employer] of Food in the Last Year: Not on file  Transportation Needs:    Lack of Transportation (Medical): Not on file   Lack of Transportation (Non-Medical): Not on file  Physical Activity:    Days of Exercise per Week: Not on file   Minutes of Exercise per Session: Not on file  Stress:    Feeling of Stress : Not on file  Social Connections:    Frequency of Communication with Friends and Family: Not on file   Frequency of Social Gatherings with Friends and Family: Not on file   Attends Religious Services: Not on file   Active Member of Clubs or Organizations: Not on file   Attends Archivist Meetings: Not on file   Marital Status: Not on file  Intimate Partner Violence:    Fear of Current or Ex-Partner: Not on file   Emotionally Abused: Not on file   Physically Abused: Not on file   Sexually Abused: Not on file    Family History   Problem Relation Age of Onset   Heart attack Mother    Stroke Father        not sure, but daughter thinks    Lung cancer Son    Lung cancer Daughter    Cancer Sister    Dementia Neg Hx     Past Medical History:  Diagnosis Date   Breast cancer in female [facility])    remote   Closed fracture of left distal radius 12/28/2018   Closed fracture of left proximal humerus 12/28/2018   Diabetes ([place])    Diabetes mellitus without complication ([place])    Essential hypertension 12/28/2018   Glaucoma    Heart attack ([place])    Neuropathy     Patient Active Problem List   Diagnosis Date Noted   Sporadic cerebral amyloid angiopathy ([place]) 12/09/2019   Altered mental status 10/11/2019   Mixed Alzheimer's and vascular dementia ([place]) 10/11/2019   Failure to thrive in adult 10/11/2019   Silent ST elevation myocardial infarction (STEMI) involving left anterior descending (LAD) coronary artery of anterior wall ([place]) 10/11/2019   Humerus fracture 12/29/2018   Closed fracture of left proximal humerus 12/28/2018  Closed fracture of left distal radius 12/28/2018   Essential hypertension 12/28/2018   Glaucoma    Diabetes mellitus without complication [facility])     Past Surgical History:  Procedure Laterality Date   herniated disc     remote   MASTECTOMY     remote   ORIF HUMERUS FRACTURE Left 12/28/2018   Procedure: OPEN REDUCTION INTERNAL FIXATION (ORIF) PROXIMAL HUMERUS FRACTURE;  Surgeon: Marchia Bond, MD;  Location: [place];  Service: Orthopedics;  Laterality: Left;   ORIF WRIST FRACTURE Left 12/28/2018   Procedure: OPEN REDUCTION INTERNAL FIXATION (ORIF) WRIST FRACTURE;  Surgeon: Marchia Bond, MD;  Location: [place];  Service: Orthopedics;  Laterality: Left;    Current Outpatient Medications  Medication Sig Dispense Refill   acetaminophen (TYLENOL) 500 MG tablet Take 1 tablet (500 mg total) by mouth every 8 (eight) hours as needed for moderate pain. 20 tablet 0    aspirin (ASPIRIN EC) 81 MG EC tablet Take 81 mg by mouth daily. Swallow whole.     carvedilol (COREG) 3.125 MG tablet Take 1 tablet (3.125 mg total) by mouth 2 (two) times daily with a meal. 180 tablet 3   clopidogrel (PLAVIX) 75 MG tablet Take 1 tablet (75 mg total) by mouth daily. 90 tablet 3   COMBIGAN 0.2-0.5 % ophthalmic solution Place 1 drop into both eyes at bedtime.   4   ferrous sulfate 325 (65 FE) MG tablet Take 1 tablet (325 mg total) by mouth 2 (two) times daily with a meal.  3   JARDIANCE 10 MG TABS tablet Take 10 mg by mouth daily.     losartan-hydrochlorothiazide (HYZAAR) 100-12.5 MG tablet Take 1 tablet by mouth daily.     metFORMIN (GLUCOPHAGE) 500 MG tablet Take 500 mg by mouth daily.   3   Multiple Vitamin (MULTIVITAMIN) capsule Take 1 capsule by mouth daily.     rosuvastatin (CRESTOR) 20 MG tablet Take 1 tablet (20 mg total) by mouth daily. 90 tablet 3   TRADJENTA 5 MG TABS tablet Take 5 mg by mouth daily.  99   Travoprost, BAK Free, (TRAVATAN) 0.004 % SOLN ophthalmic solution Place 1 drop into both eyes at bedtime.     citalopram (CELEXA) 10 MG tablet Take 1 tablet (10 mg total) by mouth daily. 30 tablet 6   No current facility-administered medications for this visit.    Allergies as of 12/09/2019   (No Known Allergies)    Vitals: BP (!) 112/50 (BP Location: Left Arm, Patient Position: Sitting)    Pulse 67    Ht 5\' 9"  (1.753 m)    Wt 137 lb (62.1 kg)    BMI 20.23 kg/m  Last Weight:  Wt Readings from Last 1 Encounters:  12/09/19 137 lb (62.1 kg)   Last Height:   Ht Readings from Last 1 Encounters:  12/09/19 5\' 9"  (1.753 m)     Physical exam: Exam: Gen: NAD, frail older woman                 CV: RRR, no MRG. No Carotid Bruits. No peripheral edema, warm, nontender Eyes: Conjunctivae clear without exudates or hemorrhage  Neuro: Detailed Neurologic Exam  Speech:    Speech is normal; fluent, not spontaneous with impaired comprehension.    Cognition: MMSE - Mini Mental State Exam 10/28/2019  Orientation to time 0  Orientation to Place 1  Registration 3  Attention/ Calculation 0  Recall 0  Language- name 2 objects 2  Language- repeat 1  Language- follow 3 step command 1  Language- read & follow direction 1  Write a sentence 0  Copy design 0  Total score 9    Cranial Nerves:    The pupils are equal, round, and reactive to light. Attempted fundoscopy could not visualize due to small pupils. Blinks to threat on the right, not on the left. Extraocular movements are intact. Trigeminal sensation is intact. The face is symmetric. The palate elevates in the midline. Hearing impaired to voice. Voice is normal. Shoulder shrug is normal. The tongue has normal motion without fasciculations.   Coordination: She does not appear tohave any ataxia or dysmetria   Gait: Cannot stand without assistance  Motor Observation:    Frail, generalized atrophy  Tone:    No increased tone appreciated    Posture:    Posture is slumped in wheelchair    Strength: Not moving right arm due to pain in the shoulder (they are seeing ortho tomorrow), all other extremities are anti-gravity without drift     Sensation: intact to LT in all extremities     Reflex Exam:  DTR's:    Deep tendon reflexes in the upper and lower extremities are symmetrical bilaterally.  Not brisk. Toes:    The toes are equivocal bilaterally.   Clonus:    Clonus is absent.    Assessment/Plan:  86 y.o. female here as requested by [name], MD for dementia. PMHx neuropathy, MI, glaucoma, HTN, diabetes, breast cancer, failure to thrive, DM2, HTN, falls, poor appetite, spastic fasciculations of the legs, arms and legs, pain in the right arm.  Work-up consistent with mixed dementia Alzheimer's and vascular.  Today we discussed the different types of dementia and Alzheimer's versus vascular or other, I answered multiple questions and provided multiple forms of  information, printed out articles on cerebral amyloid angiopathy, vascular dementia, Alzheimer's dementia and also provided websites and answered all questions.   -Patient here today for follow-up.  We reviewed MRI of the brain images together with daughter and the multiple findings including cerebral amyloid angiopathy and atrophy consistent with Alzheimer's disease but also chronic extensive microvascular ischemic changes with lacunar infarcts.  We reviewed the imaging, answered questions, consistent with mixed dementia.  We discussed things that can happen such as gait apraxia, risk for bleeding due to the microangiopathy, will discuss with cardiology patient is currently on ASA and Plavix and this puts her at significant bleeding risk.  - She may have pseudobulbar affect vs dementia will try Celexa. Also discussed Nudexta.   -Patient with advanced dementia MMSE 9 out of 30.  I discussed with daughter that it is very difficult at this point in her advanced state to differentiate between the different types of dementia but it does appear she has mixed vascular and alzheimers. Too advanced for any sort of formal memory testing.    - Any medication we may try will be unlikely to make any sort of clinical difference and may cause side effects. We discussed Donepezil and Namenda and will hold off for now.  - For constipation: try the "prune juice cocktail". Mix 1/2 cup applesauce, 2 tablespoons of miller's bran, 4-6 oz. prune juice. Store in Multimedia programmer. Take a tablespoonful per day at first, gradually increasing until you find the amount that works best. Most people find this mixture tasty.   Meds ordered this encounter  Medications   citalopram (CELEXA) 10 MG tablet    Sig: Take 1 tablet (10 mg total) by mouth daily.  Dispense:  30 tablet    Refill:  6  '  Cc: [name], MD,    Sarina Ill, MD  [facility] [address] [place], [place]  [address]  Phone [phone] Fax [phone]  I spent over 60  minutes of face-to-face and non-face-to-face time with patient on the  1. Mixed Alzheimer's and vascular dementia ([place])   2. cerebral amyloid angiopathy in Alzheimer's disease ([place])    diagnosis.  This included previsit chart review, lab review, study review, order entry, electronic health record documentation, patient education on the different diagnostic and therapeutic options, counseling and coordination of care, risks and benefits of management, compliance, or risk factor reduction

## 2019-12-10 NOTE — Telephone Encounter (Signed)
Please advise her I spoke to cardiology and at this time we recommend stopping the aspirin and plavix due to the micro-hemmorhages in the brain (cerebral amyloid angiopathy) we discussed in detail yesterday.

## 2019-12-10 NOTE — Telephone Encounter (Signed)
I called the pt's daughter [name], on [place]) and LVM (ok per DPR) advising her that Dr. Jaynee Eagles spoke to cardiology and at this time they recommend stopping the aspirin and plavix due to the micro-hemmorhages in the brain (cerebral amyloid angiopathy) that were discussed in detail yesterday. I left the office number for her to call back with questions. I will also try to reach her tomorrow to make sure she got the message.

## 2019-12-11 DIAGNOSIS — I10 Essential (primary) hypertension: Secondary | ICD-10-CM | POA: Diagnosis not present

## 2019-12-11 DIAGNOSIS — G309 Alzheimer's disease, unspecified: Secondary | ICD-10-CM | POA: Diagnosis not present

## 2019-12-11 DIAGNOSIS — F0280 Dementia in other diseases classified elsewhere without behavioral disturbance: Secondary | ICD-10-CM | POA: Diagnosis not present

## 2019-12-11 DIAGNOSIS — I5189 Other ill-defined heart diseases: Secondary | ICD-10-CM | POA: Diagnosis not present

## 2019-12-11 DIAGNOSIS — I2102 ST elevation (STEMI) myocardial infarction involving left anterior descending coronary artery: Secondary | ICD-10-CM | POA: Diagnosis not present

## 2019-12-11 DIAGNOSIS — E1121 Type 2 diabetes mellitus with diabetic nephropathy: Secondary | ICD-10-CM | POA: Diagnosis not present

## 2019-12-11 NOTE — Telephone Encounter (Signed)
I called the pt's daughter [name] again and left a detailed message as noted below from 12/10/19. I will try to reach her once more tomorrow and if no response I will send a letter.

## 2019-12-11 NOTE — Telephone Encounter (Deleted)
Noted I faxed the approval information to pt's pharmacy. Received a receipt of confirmation.

## 2019-12-12 ENCOUNTER — Ambulatory Visit: Payer: POS | Admitting: Neurology

## 2019-12-12 NOTE — Telephone Encounter (Signed)
The pt's daughter, [name], called me back and confirmed she received my message. The patient has stopped the Plavix and Aspirin. Her last dose of each was Monday 12/09/19.

## 2019-12-15 DIAGNOSIS — R296 Repeated falls: Secondary | ICD-10-CM | POA: Diagnosis not present

## 2019-12-15 DIAGNOSIS — G309 Alzheimer's disease, unspecified: Secondary | ICD-10-CM | POA: Diagnosis not present

## 2019-12-15 DIAGNOSIS — E785 Hyperlipidemia, unspecified: Secondary | ICD-10-CM | POA: Diagnosis not present

## 2019-12-15 DIAGNOSIS — Z7984 Long term (current) use of oral hypoglycemic drugs: Secondary | ICD-10-CM | POA: Diagnosis not present

## 2019-12-15 DIAGNOSIS — I10 Essential (primary) hypertension: Secondary | ICD-10-CM | POA: Diagnosis not present

## 2019-12-15 DIAGNOSIS — F0280 Dementia in other diseases classified elsewhere without behavioral disturbance: Secondary | ICD-10-CM | POA: Diagnosis not present

## 2019-12-15 DIAGNOSIS — I2102 ST elevation (STEMI) myocardial infarction involving left anterior descending coronary artery: Secondary | ICD-10-CM | POA: Diagnosis not present

## 2019-12-15 DIAGNOSIS — I5189 Other ill-defined heart diseases: Secondary | ICD-10-CM | POA: Diagnosis not present

## 2019-12-15 DIAGNOSIS — D649 Anemia, unspecified: Secondary | ICD-10-CM | POA: Diagnosis not present

## 2019-12-15 DIAGNOSIS — E1121 Type 2 diabetes mellitus with diabetic nephropathy: Secondary | ICD-10-CM | POA: Diagnosis not present

## 2019-12-16 DIAGNOSIS — E1121 Type 2 diabetes mellitus with diabetic nephropathy: Secondary | ICD-10-CM | POA: Diagnosis not present

## 2019-12-16 DIAGNOSIS — I10 Essential (primary) hypertension: Secondary | ICD-10-CM | POA: Diagnosis not present

## 2019-12-16 DIAGNOSIS — F0280 Dementia in other diseases classified elsewhere without behavioral disturbance: Secondary | ICD-10-CM | POA: Diagnosis not present

## 2019-12-16 DIAGNOSIS — G309 Alzheimer's disease, unspecified: Secondary | ICD-10-CM | POA: Diagnosis not present

## 2019-12-16 DIAGNOSIS — I2102 ST elevation (STEMI) myocardial infarction involving left anterior descending coronary artery: Secondary | ICD-10-CM | POA: Diagnosis not present

## 2019-12-16 DIAGNOSIS — I5189 Other ill-defined heart diseases: Secondary | ICD-10-CM | POA: Diagnosis not present

## 2019-12-18 DIAGNOSIS — I10 Essential (primary) hypertension: Secondary | ICD-10-CM | POA: Diagnosis not present

## 2019-12-18 DIAGNOSIS — I5189 Other ill-defined heart diseases: Secondary | ICD-10-CM | POA: Diagnosis not present

## 2019-12-18 DIAGNOSIS — F0280 Dementia in other diseases classified elsewhere without behavioral disturbance: Secondary | ICD-10-CM | POA: Diagnosis not present

## 2019-12-18 DIAGNOSIS — I2102 ST elevation (STEMI) myocardial infarction involving left anterior descending coronary artery: Secondary | ICD-10-CM | POA: Diagnosis not present

## 2019-12-18 DIAGNOSIS — G309 Alzheimer's disease, unspecified: Secondary | ICD-10-CM | POA: Diagnosis not present

## 2019-12-18 DIAGNOSIS — E1121 Type 2 diabetes mellitus with diabetic nephropathy: Secondary | ICD-10-CM | POA: Diagnosis not present

## 2019-12-30 DIAGNOSIS — G309 Alzheimer's disease, unspecified: Secondary | ICD-10-CM | POA: Diagnosis not present

## 2019-12-30 DIAGNOSIS — I2102 ST elevation (STEMI) myocardial infarction involving left anterior descending coronary artery: Secondary | ICD-10-CM | POA: Diagnosis not present

## 2019-12-30 DIAGNOSIS — I10 Essential (primary) hypertension: Secondary | ICD-10-CM | POA: Diagnosis not present

## 2019-12-30 DIAGNOSIS — I5189 Other ill-defined heart diseases: Secondary | ICD-10-CM | POA: Diagnosis not present

## 2019-12-30 DIAGNOSIS — E1121 Type 2 diabetes mellitus with diabetic nephropathy: Secondary | ICD-10-CM | POA: Diagnosis not present

## 2019-12-30 DIAGNOSIS — F0280 Dementia in other diseases classified elsewhere without behavioral disturbance: Secondary | ICD-10-CM | POA: Diagnosis not present

## 2019-12-31 DIAGNOSIS — I5189 Other ill-defined heart diseases: Secondary | ICD-10-CM | POA: Diagnosis not present

## 2019-12-31 DIAGNOSIS — I2102 ST elevation (STEMI) myocardial infarction involving left anterior descending coronary artery: Secondary | ICD-10-CM | POA: Diagnosis not present

## 2019-12-31 DIAGNOSIS — G309 Alzheimer's disease, unspecified: Secondary | ICD-10-CM | POA: Diagnosis not present

## 2019-12-31 DIAGNOSIS — R358 Other polyuria: Secondary | ICD-10-CM | POA: Diagnosis not present

## 2019-12-31 DIAGNOSIS — E1121 Type 2 diabetes mellitus with diabetic nephropathy: Secondary | ICD-10-CM | POA: Diagnosis not present

## 2019-12-31 DIAGNOSIS — F0280 Dementia in other diseases classified elsewhere without behavioral disturbance: Secondary | ICD-10-CM | POA: Diagnosis not present

## 2019-12-31 DIAGNOSIS — I10 Essential (primary) hypertension: Secondary | ICD-10-CM | POA: Diagnosis not present

## 2020-01-06 DIAGNOSIS — G309 Alzheimer's disease, unspecified: Secondary | ICD-10-CM | POA: Diagnosis not present

## 2020-01-06 DIAGNOSIS — I10 Essential (primary) hypertension: Secondary | ICD-10-CM | POA: Diagnosis not present

## 2020-01-06 DIAGNOSIS — I5189 Other ill-defined heart diseases: Secondary | ICD-10-CM | POA: Diagnosis not present

## 2020-01-06 DIAGNOSIS — I2102 ST elevation (STEMI) myocardial infarction involving left anterior descending coronary artery: Secondary | ICD-10-CM | POA: Diagnosis not present

## 2020-01-06 DIAGNOSIS — F0280 Dementia in other diseases classified elsewhere without behavioral disturbance: Secondary | ICD-10-CM | POA: Diagnosis not present

## 2020-01-06 DIAGNOSIS — E1121 Type 2 diabetes mellitus with diabetic nephropathy: Secondary | ICD-10-CM | POA: Diagnosis not present

## 2020-01-07 DIAGNOSIS — I10 Essential (primary) hypertension: Secondary | ICD-10-CM | POA: Diagnosis not present

## 2020-01-07 DIAGNOSIS — G309 Alzheimer's disease, unspecified: Secondary | ICD-10-CM | POA: Diagnosis not present

## 2020-01-07 DIAGNOSIS — I2102 ST elevation (STEMI) myocardial infarction involving left anterior descending coronary artery: Secondary | ICD-10-CM | POA: Diagnosis not present

## 2020-01-07 DIAGNOSIS — E1121 Type 2 diabetes mellitus with diabetic nephropathy: Secondary | ICD-10-CM | POA: Diagnosis not present

## 2020-01-07 DIAGNOSIS — I5189 Other ill-defined heart diseases: Secondary | ICD-10-CM | POA: Diagnosis not present

## 2020-01-07 DIAGNOSIS — F0280 Dementia in other diseases classified elsewhere without behavioral disturbance: Secondary | ICD-10-CM | POA: Diagnosis not present

## 2020-01-09 DIAGNOSIS — F0280 Dementia in other diseases classified elsewhere without behavioral disturbance: Secondary | ICD-10-CM | POA: Diagnosis not present

## 2020-01-09 DIAGNOSIS — E1121 Type 2 diabetes mellitus with diabetic nephropathy: Secondary | ICD-10-CM | POA: Diagnosis not present

## 2020-01-09 DIAGNOSIS — I2102 ST elevation (STEMI) myocardial infarction involving left anterior descending coronary artery: Secondary | ICD-10-CM | POA: Diagnosis not present

## 2020-01-09 DIAGNOSIS — I10 Essential (primary) hypertension: Secondary | ICD-10-CM | POA: Diagnosis not present

## 2020-01-09 DIAGNOSIS — G309 Alzheimer's disease, unspecified: Secondary | ICD-10-CM | POA: Diagnosis not present

## 2020-01-09 DIAGNOSIS — I5189 Other ill-defined heart diseases: Secondary | ICD-10-CM | POA: Diagnosis not present

## 2020-01-10 DIAGNOSIS — R399 Unspecified symptoms and signs involving the genitourinary system: Secondary | ICD-10-CM | POA: Diagnosis not present

## 2020-01-10 DIAGNOSIS — N390 Urinary tract infection, site not specified: Secondary | ICD-10-CM | POA: Diagnosis not present

## 2020-01-13 DIAGNOSIS — E1121 Type 2 diabetes mellitus with diabetic nephropathy: Secondary | ICD-10-CM | POA: Diagnosis not present

## 2020-01-13 DIAGNOSIS — I10 Essential (primary) hypertension: Secondary | ICD-10-CM | POA: Diagnosis not present

## 2020-01-13 DIAGNOSIS — G309 Alzheimer's disease, unspecified: Secondary | ICD-10-CM | POA: Diagnosis not present

## 2020-01-13 DIAGNOSIS — I5189 Other ill-defined heart diseases: Secondary | ICD-10-CM | POA: Diagnosis not present

## 2020-01-13 DIAGNOSIS — I2102 ST elevation (STEMI) myocardial infarction involving left anterior descending coronary artery: Secondary | ICD-10-CM | POA: Diagnosis not present

## 2020-01-13 DIAGNOSIS — N390 Urinary tract infection, site not specified: Secondary | ICD-10-CM | POA: Diagnosis not present

## 2020-01-13 DIAGNOSIS — F0280 Dementia in other diseases classified elsewhere without behavioral disturbance: Secondary | ICD-10-CM | POA: Diagnosis not present

## 2020-01-14 DIAGNOSIS — R296 Repeated falls: Secondary | ICD-10-CM | POA: Diagnosis not present

## 2020-01-14 DIAGNOSIS — D649 Anemia, unspecified: Secondary | ICD-10-CM | POA: Diagnosis not present

## 2020-01-14 DIAGNOSIS — F0280 Dementia in other diseases classified elsewhere without behavioral disturbance: Secondary | ICD-10-CM | POA: Diagnosis not present

## 2020-01-14 DIAGNOSIS — E1121 Type 2 diabetes mellitus with diabetic nephropathy: Secondary | ICD-10-CM | POA: Diagnosis not present

## 2020-01-14 DIAGNOSIS — I5189 Other ill-defined heart diseases: Secondary | ICD-10-CM | POA: Diagnosis not present

## 2020-01-14 DIAGNOSIS — E785 Hyperlipidemia, unspecified: Secondary | ICD-10-CM | POA: Diagnosis not present

## 2020-01-14 DIAGNOSIS — Z7984 Long term (current) use of oral hypoglycemic drugs: Secondary | ICD-10-CM | POA: Diagnosis not present

## 2020-01-14 DIAGNOSIS — I10 Essential (primary) hypertension: Secondary | ICD-10-CM | POA: Diagnosis not present

## 2020-01-14 DIAGNOSIS — G309 Alzheimer's disease, unspecified: Secondary | ICD-10-CM | POA: Diagnosis not present

## 2020-01-14 DIAGNOSIS — I2102 ST elevation (STEMI) myocardial infarction involving left anterior descending coronary artery: Secondary | ICD-10-CM | POA: Diagnosis not present

## 2020-01-20 DIAGNOSIS — E1121 Type 2 diabetes mellitus with diabetic nephropathy: Secondary | ICD-10-CM | POA: Diagnosis not present

## 2020-01-20 DIAGNOSIS — G309 Alzheimer's disease, unspecified: Secondary | ICD-10-CM | POA: Diagnosis not present

## 2020-01-20 DIAGNOSIS — I2102 ST elevation (STEMI) myocardial infarction involving left anterior descending coronary artery: Secondary | ICD-10-CM | POA: Diagnosis not present

## 2020-01-20 DIAGNOSIS — I10 Essential (primary) hypertension: Secondary | ICD-10-CM | POA: Diagnosis not present

## 2020-01-20 DIAGNOSIS — F0280 Dementia in other diseases classified elsewhere without behavioral disturbance: Secondary | ICD-10-CM | POA: Diagnosis not present

## 2020-01-20 DIAGNOSIS — I5189 Other ill-defined heart diseases: Secondary | ICD-10-CM | POA: Diagnosis not present

## 2020-01-23 ENCOUNTER — Ambulatory Visit: Payer: Self-pay | Admitting: Neurology

## 2020-01-23 DIAGNOSIS — G309 Alzheimer's disease, unspecified: Secondary | ICD-10-CM | POA: Diagnosis not present

## 2020-01-23 DIAGNOSIS — I5189 Other ill-defined heart diseases: Secondary | ICD-10-CM | POA: Diagnosis not present

## 2020-01-23 DIAGNOSIS — I2102 ST elevation (STEMI) myocardial infarction involving left anterior descending coronary artery: Secondary | ICD-10-CM | POA: Diagnosis not present

## 2020-01-23 DIAGNOSIS — I10 Essential (primary) hypertension: Secondary | ICD-10-CM | POA: Diagnosis not present

## 2020-01-23 DIAGNOSIS — F0280 Dementia in other diseases classified elsewhere without behavioral disturbance: Secondary | ICD-10-CM | POA: Diagnosis not present

## 2020-01-23 DIAGNOSIS — E1121 Type 2 diabetes mellitus with diabetic nephropathy: Secondary | ICD-10-CM | POA: Diagnosis not present

## 2020-01-27 DIAGNOSIS — E1121 Type 2 diabetes mellitus with diabetic nephropathy: Secondary | ICD-10-CM | POA: Diagnosis not present

## 2020-01-27 DIAGNOSIS — I2102 ST elevation (STEMI) myocardial infarction involving left anterior descending coronary artery: Secondary | ICD-10-CM | POA: Diagnosis not present

## 2020-01-27 DIAGNOSIS — I10 Essential (primary) hypertension: Secondary | ICD-10-CM | POA: Diagnosis not present

## 2020-01-27 DIAGNOSIS — I5189 Other ill-defined heart diseases: Secondary | ICD-10-CM | POA: Diagnosis not present

## 2020-01-27 DIAGNOSIS — F0280 Dementia in other diseases classified elsewhere without behavioral disturbance: Secondary | ICD-10-CM | POA: Diagnosis not present

## 2020-01-27 DIAGNOSIS — G309 Alzheimer's disease, unspecified: Secondary | ICD-10-CM | POA: Diagnosis not present

## 2020-01-28 DIAGNOSIS — R634 Abnormal weight loss: Secondary | ICD-10-CM | POA: Diagnosis not present

## 2020-01-28 DIAGNOSIS — K5900 Constipation, unspecified: Secondary | ICD-10-CM | POA: Diagnosis not present

## 2020-01-28 DIAGNOSIS — I951 Orthostatic hypotension: Secondary | ICD-10-CM | POA: Diagnosis not present

## 2020-01-28 DIAGNOSIS — R350 Frequency of micturition: Secondary | ICD-10-CM | POA: Diagnosis not present

## 2020-01-28 DIAGNOSIS — F0150 Vascular dementia without behavioral disturbance: Secondary | ICD-10-CM | POA: Diagnosis not present

## 2020-01-28 DIAGNOSIS — D509 Iron deficiency anemia, unspecified: Secondary | ICD-10-CM | POA: Diagnosis not present

## 2020-01-30 ENCOUNTER — Encounter: Payer: Self-pay | Admitting: Cardiology

## 2020-01-30 ENCOUNTER — Ambulatory Visit (INDEPENDENT_AMBULATORY_CARE_PROVIDER_SITE_OTHER): Payer: Medicare Other | Admitting: Cardiology

## 2020-01-30 ENCOUNTER — Other Ambulatory Visit: Payer: Self-pay

## 2020-01-30 VITALS — BP 116/50 | HR 62 | Ht 68.0 in | Wt 135.4 lb

## 2020-01-30 DIAGNOSIS — E876 Hypokalemia: Secondary | ICD-10-CM | POA: Diagnosis not present

## 2020-01-30 DIAGNOSIS — I249 Acute ischemic heart disease, unspecified: Secondary | ICD-10-CM

## 2020-01-30 DIAGNOSIS — R350 Frequency of micturition: Secondary | ICD-10-CM | POA: Diagnosis not present

## 2020-01-30 DIAGNOSIS — I10 Essential (primary) hypertension: Secondary | ICD-10-CM | POA: Diagnosis not present

## 2020-01-30 DIAGNOSIS — E854 Organ-limited amyloidosis: Secondary | ICD-10-CM | POA: Diagnosis not present

## 2020-01-30 DIAGNOSIS — I680 Cerebral amyloid angiopathy: Secondary | ICD-10-CM | POA: Diagnosis not present

## 2020-01-30 DIAGNOSIS — I2102 ST elevation (STEMI) myocardial infarction involving left anterior descending coronary artery: Secondary | ICD-10-CM | POA: Diagnosis not present

## 2020-01-30 DIAGNOSIS — N3020 Other chronic cystitis without hematuria: Secondary | ICD-10-CM | POA: Diagnosis not present

## 2020-01-30 MED ORDER — LOSARTAN POTASSIUM 100 MG PO TABS
100.0000 mg | ORAL_TABLET | Freq: Every day | ORAL | 3 refills | Status: AC
Start: 2020-01-30 — End: 2020-04-29

## 2020-01-30 NOTE — Patient Instructions (Addendum)
Medication Instructions:   stop taking Losartan - hydrochlorothiazide  Start ing Losartan 100 mg one tablet daily    *If you need a refill on your cardiac medications before your next appointment, please call your pharmacy*   Lab Work: Not needed    Testing/Procedures: Not needed   Follow-Up: At [facility], you and your health needs are our priority.  As part of our continuing mission to provide you with exceptional heart care, we have created designated Provider Care Teams.  These Care Teams include your primary Cardiologist (physician) and Advanced Practice Providers (APPs -  Physician Assistants and Nurse Practitioners) who all work together to provide you with the care you need, when you need it.    Your next appointment:   6 month(s) April 2022  The format for your next appointment:   In Person  Provider:   Glenetta Hew, MD

## 2020-01-30 NOTE — Progress Notes (Signed)
Primary Care Provider: [name], MD Cardiologist: Vickie Hew, MD Electrophysiologist: None  Clinic Note: Chief Complaint  Patient presents with  . Follow-up    4 months ->  . Code STEMI     elevated troponin in the setting of multivessel status/possible stroke.  Thought to be stress related..  Echo had normal wall motion and normal EF.  Would argue against acute MI.    HPI:    Vickie Young is a 86 y.o. female with a PMH notable for Alzheimer's dementia, hypertension and diabetes mellitus, with relatively recent silent STEMI who presents today for 63-month follow-up.Marland Kitchen  Recent Hospitalizations:   June 25-27, 2021: Admitted with decreased p.o. intake with poor appetite/altered mental status.  Was having spastic type fasciculations in the legs.  Had noted at least 3 or 4 times falling in the last week or so.  Had notable bruising on the hips and arms.  No complaint of chest pain or dyspnea.  No orthopnea.  EKG suggests anterior ST elevations with troponin up to 1200.  We recommended conservative management 48 hours of heparin and addition of aspirin Plavix along with carvedilol and statin.  She had no symptoms during entire hospital stay.  Echo was normal.  No plans for invasive evaluation.  Vickie Young was last seen on October 23, 2019 by Vickie Sera, NP for hospital follow-up.  No further angina.  Remains on aspirin, Plavix (DAPT discontinued per neurology), carvedilol, Hyzaar, and simvastatin.  Recommended increase activity.  Remains on combination of Jardiance and Tradjenta and Metformin for diabetes.   Reviewed  CV studies:    The following studies were reviewed today: (if available, images/films reviewed: From Epic Chart or Care Everywhere) . Echo 10/13/2019: EF 60 to 65%.  No R WMA.  Moderate concentric LVH.  GR 1 DD.  Mild LA dilation.  Mild-moderate aortic sclerosis but no stenosis..  Interval History:   Vickie Young is here today overall really with no  major complaints.  Her daughter is at accompanying her.  She is the main caregiver and provides 95% of the history since [place] is really not verbal..    She says that the episode that happened that led to her hospitalization was preceded by a having a lot of stress in the house where the patient's son (who was actually they are at the hospital with her) have been involved in a fight with his wife, and apparently as is the usual case, his normal recourse in this situation is to go temporarily moved in with his mother puts her under significant mental stress because he has habits that unsettled her.  This was not going to be allowed by the daughter (his sister) who is here with him today as well as another sibling.  There was some infighting going on between the siblings and their mother overheard this.  She had awakened to come out into the living room because of the "commotion".  She was therefore witnessed to the negative energy between her children and this got her quite upset.  Shortly thereafter she then started having her minimal responsiveness/neurologic change.  She had had several falls that week and has been noticing reduced energy.  There was no sensation whatsoever of chest pain.  Vickie Young did Young well during the hospital with no major issues.  There is no sign of any recurrent anginal symptoms.  Despite having elevated troponin levels, her echocardiogram was essentially normal.  She was treated with a short course of  IV heparin, but the considerations was that it was probably stress related.  Per discussion with the patient's daughter via telephone and her son, we made the decision to avoid aggressive invasive or noninvasive evaluation i.e. no stress test or cath, in light of the normal echocardiogram. Since discharge, the only thing that Vickie Young has been having an issue with his constipation has borderline impaction.  She is due to see GI.  The other thing that is noted is that she had maybe  been a little more fatigued and tired with less energy.  She is not noticing any significant dyspnea or orthopnea or PND.  No significant edema.  Again, very difficult history because the patient herself is not answering.  CV Review of Symptoms (Summary) Cardiovascular ROS: positive for - dyspnea on exertion and Less energy.  Constipation negative for - chest pain, edema, irregular heartbeat, orthopnea, palpitations, paroxysmal nocturnal dyspnea, rapid heart rate, shortness of breath or Syncope/near syncope or TIA/amaurosis fugax.  Claudication-having probably that she does not walk enough to note claudication)  The patient does not have symptoms concerning for COVID-19 infection (fever, chills, cough, or new shortness of breath).   REVIEWED OF SYSTEMS   Review of Systems  Constitutional: Positive for malaise/fatigue and weight loss.  HENT: Negative for nosebleeds.   Respiratory: Negative for shortness of breath.        Does have intermittent spells of rapid breathing.  Gastrointestinal: Positive for abdominal pain, constipation and nausea.  Genitourinary: Negative for hematuria.  Musculoskeletal: Positive for joint pain. Negative for falls.  Neurological: Positive for dizziness (This is why she is in a wheelchair.). Negative for seizures.  Psychiatric/Behavioral: Positive for memory loss.       Dementia.  Some emotional lability.   I have reviewed and (if needed) personally updated the patient's problem list, medications, allergies, past medical and surgical history, social and family history.   PAST MEDICAL HISTORY   Past Medical History:  Diagnosis Date  . Breast cancer in female [facility])    remote  . Closed fracture of left distal radius 12/28/2018  . Closed fracture of left proximal humerus 12/28/2018  . Dementia due to Alzheimer's disease ([place])   . Diabetes mellitus without complication ([place])   . Essential hypertension 12/28/2018  . Glaucoma   . Heart attack ([place]) 09/2019    Suggested by elevated troponin in setting of altered mental status.  (Significant social stress) normal echocardiogram no wall motion normality.  No chest pain.  Marland Kitchen Neuropathy     PAST SURGICAL HISTORY   Past Surgical History:  Procedure Laterality Date  . herniated disc     remote  . MASTECTOMY     remote  . ORIF HUMERUS FRACTURE Left 12/28/2018   Procedure: OPEN REDUCTION INTERNAL FIXATION (ORIF) PROXIMAL HUMERUS FRACTURE;  Surgeon: Marchia Bond, MD;  Location: [facility];  Service: Orthopedics;  Laterality: Left;  . ORIF WRIST FRACTURE Left 12/28/2018   Procedure: OPEN REDUCTION INTERNAL FIXATION (ORIF) WRIST FRACTURE;  Surgeon: Marchia Bond, MD;  Location: [facility];  Service: Orthopedics;  Laterality: Left;  . TRANSTHORACIC ECHOCARDIOGRAM  10/13/2019   : EF 60 to 65%.  No R WMA.  Moderate concentric LVH.  GR 1 DD.  Mild LA dilation.  Mild-moderate aortic sclerosis but no stenosis..    Immunization History  Administered Date(s) Administered  . Fluad Quad(high Dose 65+) 12/30/2018  . Tdap 12/28/2018    MEDICATIONS/ALLERGIES   Current Meds  Medication Sig  . carvedilol (COREG) 3.125 MG tablet  Take 1 tablet (3.125 mg total) by mouth 2 (two) times daily with a meal.  . citalopram (CELEXA) 10 MG tablet Take 1 tablet (10 mg total) by mouth daily.  . COMBIGAN 0.2-0.5 % ophthalmic solution Place 1 drop into both eyes at bedtime.   . Continuous Blood Gluc Sensor (FREESTYLE LIBRE 2 SENSOR) MISC USE AS DIRECTED EVERY 14 DAYS TO CHECK BLOOD GLUCOSE  . ferrous sulfate 325 (65 FE) MG tablet Take 1 tablet (325 mg total) by mouth 2 (two) times daily with a meal.  . glucose blood test strip Contour Test Strips  USE AS DIRECTED EVERY DAY  . JARDIANCE 10 MG TABS tablet Take 10 mg by mouth daily.  . metFORMIN (GLUCOPHAGE) 500 MG tablet Take 500 mg by mouth daily.   . Multiple Vitamin (MULTIVITAMIN) capsule Take 1 capsule by mouth daily.  . rosuvastatin (CRESTOR) 20 MG tablet Take 1 tablet (20 mg  total) by mouth daily.  . TRADJENTA 5 MG TABS tablet Take 5 mg by mouth daily.  . Travoprost, BAK Free, (TRAVATAN) 0.004 % SOLN ophthalmic solution Place 1 drop into both eyes at bedtime.  . [DISCONTINUED] losartan-hydrochlorothiazide (HYZAAR) 100-12.5 MG tablet Take 1 tablet by mouth daily.    No Known Allergies  SOCIAL HISTORY/FAMILY HISTORY   Reviewed in Epic:  Pertinent findings: Terrisa lives at home, her daughter and primary caregiver accompanies her.  She lives with Vickie Young and takes care of her.  There are also other siblings that are involved as well.  Santita herself has Alzheimer's dementia and glaucoma with poor vision.  She also has poor hearing.  OBJCTIVE -PE, EKG, labs   Wt Readings from Last 3 Encounters:  01/30/20 135 lb 6.4 oz (61.4 kg)  12/09/19 137 lb (62.1 kg)  10/28/19 143 lb (64.9 kg)    Physical Exam: BP (!) 116/50   Pulse 62   Ht 5\' 8"  (1.727 m)   Wt 135 lb 6.4 oz (61.4 kg)   BMI 20.59 kg/m  Physical Exam Vitals reviewed.  Constitutional:      General: She is not in acute distress.    Appearance: She is not ill-appearing (Appears frail and elderly.) or toxic-appearing.     Comments: Seated in wheelchair with blanket.  Defers despite every cancer question to the daughter.  Sometimes stares blankly.  Neck:     Vascular: Carotid bruit (Soft bilateral carotid bruits versus radiated murmur.) present.  Cardiovascular:     Rate and Rhythm: Normal rate and regular rhythm.     Pulses: Normal pulses.     Heart sounds: Murmur (1-2/6 SEM at RUSB.->carotid.) heard.  No friction rub. Gallop present.   Pulmonary:     Effort: Pulmonary effort is normal. No respiratory distress.     Breath sounds: Normal breath sounds.  Chest:     Chest wall: Tenderness (She does have some sternal tenderness.) present.  Abdominal:     General: Bowel sounds are normal. There is no distension.     Palpations: There is no mass.     Hernia: No hernia is present.  Musculoskeletal:         General: Swelling (Trivial bilateral LE) present. Normal range of motion.     Cervical back: Neck supple.     Comments: Rolled in on wheelchair.  Unable to acess gait.  Skin:    Findings: No bruising.  Neurological:     General: No focal deficit present.     Mental Status: She is alert.  Comments: Not really verbally responsive.  Therefore unable to tell awareness or orientation.  She does respond to her name (only when her daughter talks to her)  Psychiatric:     Comments: Blunted affect.  Does not necessarily respond to questions.  Daughter answers all questions.  Unable to assess judgment or mood.  She seems very subdued.     Adult ECG Report  Rate: 62;  Rhythm: normal sinus rhythm and Left axis deviation.  LVH with repolarization changes.  Cannot exclude anterior MI, age undetermined;   Narrative Interpretation: Stable EKG  Recent Labs:    Lab Results  Component Value Date   CHOL 145 10/11/2019   HDL 33 (L) 10/11/2019   LDLCALC 86 10/11/2019   TRIG 131 10/11/2019   CHOLHDL 4.4 10/11/2019   Lab Results  Component Value Date   CREATININE 1.12 (H) 10/13/2019   BUN 21 10/13/2019   NA 135 10/13/2019   K 3.3 (L) 10/13/2019   CL 102 10/13/2019   CO2 23 10/13/2019   Lab Results  Component Value Date   TSH 1.450 10/28/2019    ASSESSMENT/PLAN    Problem List Items Addressed This Visit    Silent ST elevation myocardial infarction (STEMI) involving left anterior descending (LAD) coronary artery of anterior wall (HCC) - Primary (Chronic)    This was suggested by the EKG with no symptoms, however echocardiogram 100% would not corroborate this.  There is normal anterior motion with no wall motion normality and normal EF.  I suspect she probably did have an MI, but it was likely demand ischemia versus stress-induced.  She had significant stress going on.  Plan: Was on DAPT, but per neurology this was discontinued because of her Alzheimer's.  At STEMI continue statin  and ARB along with carvedilol.  She is on Jardiance which is cardioprotective.  No recurrent anginal symptoms.  No heart failure symptoms and normal EF by echo.      Relevant Medications   losartan (COZAAR) 100 MG tablet   Essential hypertension (Chronic)    Blood pressure looks great today on the current dose of Hyzaar and carvedilol.  A little leery of her potassium being 3.3.  Also a little leery of her be able dehydrated.  Daughter notes that she occasionally feels dizzy.  DC HCTZ and simply treat with losartan 100 mg daily.      Relevant Medications   losartan (COZAAR) 100 MG tablet   Other Relevant Orders   EKG 12-Lead (Completed)   Sporadic cerebral amyloid angiopathy ([place])    Per neurology, we have stopped DAPT because of this.      Relevant Medications   losartan (COZAAR) 100 MG tablet   Hypokalemia due to loss of potassium    Borderline hypokalemia with a potassium of 3.3.  Just to avoid the potential of hyponatremia and/or hypokalemia becoming worse, will have her stop the HCTZ component of the medication and simply continue losartan alone.          COVID-19 Education: The signs and symptoms of COVID-19 were discussed with the patient and how to seek care for testing (follow up with PCP or arrange E-visit).   The importance of social distancing and COVID-19 vaccination was discussed today.  The patient is practicing social distancing & Masking.   I spent a total of 53minutes with the patient spent in direct patient consultation.  Additional time spent with chart review  / charting (studies, outside notes, etc): 14  We spent a long time  trying to figure out just what happened leading up to the episode but also talking about plans going forward.  We decided Young much to avoid any invasive evaluation.  We will simply discontinue medical management.  We reviewed the echo results and what that entails. Total Time: 52 min   Current medicines are reviewed at length  with the patient today.  (+/- concerns) n/a  This visit occurred during the SARS-CoV-2 public health emergency.  Safety protocols were in place, including screening questions prior to the visit, additional usage of staff PPE, and extensive cleaning of exam room while observing appropriate contact time as indicated for disinfecting solutions.  Notice: This dictation was prepared with Dragon dictation along with smaller phrase technology. Any transcriptional errors that result from this process are unintentional and may not be corrected upon review.  Patient Instructions / Medication Changes & Studies & Tests Ordered   Patient Instructions  Medication Instructions:   stop taking Losartan - hydrochlorothiazide  Start ing Losartan 100 mg one tablet daily    *If you need a refill on your cardiac medications before your next appointment, please call your pharmacy*   Lab Work: Not needed    Testing/Procedures: Not needed   Follow-Up: At [facility], you and your health needs are our priority.  As part of our continuing mission to provide you with exceptional heart care, we have created designated Provider Care Teams.  These Care Teams include your primary Cardiologist (physician) and Advanced Practice Providers (APPs -  Physician Assistants and Nurse Practitioners) who all work together to provide you with the care you need, when you need it.    Your next appointment:   6 month(s) April 2022  The format for your next appointment:   In Person  Provider:   Glenetta Hew, MD       Studies Ordered:   Orders Placed This Encounter  Procedures  . EKG 12-Lead     Vickie Young, M.D., M.S. Interventional Cardiologist   Pager # [phone] Phone # [phone] [address] [place], [place] [address]   Thank you for choosing Heartcare at [facility]!!

## 2020-02-03 DIAGNOSIS — K5900 Constipation, unspecified: Secondary | ICD-10-CM | POA: Diagnosis not present

## 2020-02-03 DIAGNOSIS — K7689 Other specified diseases of liver: Secondary | ICD-10-CM | POA: Diagnosis not present

## 2020-02-03 DIAGNOSIS — I700 Atherosclerosis of aorta: Secondary | ICD-10-CM | POA: Diagnosis not present

## 2020-02-03 DIAGNOSIS — R634 Abnormal weight loss: Secondary | ICD-10-CM | POA: Diagnosis not present

## 2020-02-03 DIAGNOSIS — D509 Iron deficiency anemia, unspecified: Secondary | ICD-10-CM | POA: Diagnosis not present

## 2020-02-03 DIAGNOSIS — K5901 Slow transit constipation: Secondary | ICD-10-CM | POA: Diagnosis not present

## 2020-02-03 DIAGNOSIS — N281 Cyst of kidney, acquired: Secondary | ICD-10-CM | POA: Diagnosis not present

## 2020-02-04 DIAGNOSIS — F0280 Dementia in other diseases classified elsewhere without behavioral disturbance: Secondary | ICD-10-CM | POA: Diagnosis not present

## 2020-02-04 DIAGNOSIS — G309 Alzheimer's disease, unspecified: Secondary | ICD-10-CM | POA: Diagnosis not present

## 2020-02-04 DIAGNOSIS — I5189 Other ill-defined heart diseases: Secondary | ICD-10-CM | POA: Diagnosis not present

## 2020-02-04 DIAGNOSIS — I2102 ST elevation (STEMI) myocardial infarction involving left anterior descending coronary artery: Secondary | ICD-10-CM | POA: Diagnosis not present

## 2020-02-04 DIAGNOSIS — I10 Essential (primary) hypertension: Secondary | ICD-10-CM | POA: Diagnosis not present

## 2020-02-04 DIAGNOSIS — E1121 Type 2 diabetes mellitus with diabetic nephropathy: Secondary | ICD-10-CM | POA: Diagnosis not present

## 2020-02-07 ENCOUNTER — Encounter: Payer: Self-pay | Admitting: Cardiology

## 2020-02-07 DIAGNOSIS — N3020 Other chronic cystitis without hematuria: Secondary | ICD-10-CM | POA: Diagnosis not present

## 2020-02-07 DIAGNOSIS — E876 Hypokalemia: Secondary | ICD-10-CM | POA: Insufficient documentation

## 2020-02-07 NOTE — Assessment & Plan Note (Signed)
I think this is all triggered by social stress.  The whole seen was triggered by social stress.  I would be reluctant to be overly aggressive treating.

## 2020-02-07 NOTE — Assessment & Plan Note (Signed)
Per neurology, we have stopped DAPT because of this.

## 2020-02-07 NOTE — Assessment & Plan Note (Signed)
Borderline hypokalemia with a potassium of 3.3.  Just to avoid the potential of hyponatremia and/or hypokalemia becoming worse, will have her stop the HCTZ component of the medication and simply continue losartan alone.

## 2020-02-07 NOTE — Assessment & Plan Note (Signed)
Blood pressure looks great today on the current dose of Hyzaar and carvedilol.  A little leery of her potassium being 3.3.  Also a little leery of her be able dehydrated.  Daughter notes that she occasionally feels dizzy.  DC HCTZ and simply treat with losartan 100 mg daily.

## 2020-02-07 NOTE — Assessment & Plan Note (Signed)
This was suggested by the EKG with no symptoms, however echocardiogram 100% would not corroborate this.  There is normal anterior motion with no wall motion normality and normal EF.  I suspect she probably did have an MI, but it was likely demand ischemia versus stress-induced.  She had significant stress going on.  Plan: Was on DAPT, but per neurology this was discontinued because of her Alzheimer's.  At STEMI continue statin and ARB along with carvedilol.  She is on Jardiance which is cardioprotective.  No recurrent anginal symptoms.  No heart failure symptoms and normal EF by echo.

## 2020-02-10 DIAGNOSIS — K5904 Chronic idiopathic constipation: Secondary | ICD-10-CM | POA: Diagnosis not present

## 2020-02-10 DIAGNOSIS — D509 Iron deficiency anemia, unspecified: Secondary | ICD-10-CM | POA: Diagnosis not present

## 2020-03-24 DIAGNOSIS — Z23 Encounter for immunization: Secondary | ICD-10-CM | POA: Diagnosis not present

## 2020-03-25 DIAGNOSIS — N390 Urinary tract infection, site not specified: Secondary | ICD-10-CM | POA: Diagnosis not present

## 2020-03-25 DIAGNOSIS — R300 Dysuria: Secondary | ICD-10-CM | POA: Diagnosis not present

## 2020-03-30 ENCOUNTER — Telehealth: Payer: Self-pay | Admitting: Neurology

## 2020-03-30 ENCOUNTER — Other Ambulatory Visit: Payer: Self-pay | Admitting: *Deleted

## 2020-03-30 DIAGNOSIS — R519 Headache, unspecified: Secondary | ICD-10-CM

## 2020-03-30 NOTE — Telephone Encounter (Signed)
I spoke with [name], pt's daughter and discussed the options and suggestions from Dr Jaynee Eagles. [name] opted at this time to have Dr Jaynee Eagles order the CT head. We discussed s/s of bleeding in brain including but not limited to decreased LOC, increased pain, seizure, slurred speech, weakness, etc. She will continue to monitor and if she feels the patient should go to ER she will not hesitate. We discussed [name] will receive a separate call to schedule CT when auth complete and will also receive a call when results are available. She verbalized appreciation. Her questions were answered.

## 2020-03-30 NOTE — Telephone Encounter (Signed)
Spoke with Dr Jaynee Eagles and placed v.o. for CT head WO contrast.

## 2020-03-30 NOTE — Telephone Encounter (Signed)
I would order a CT of the head to rule out a bleed. But if she is on so much pain the quickest thing to do is go to the emergency room, we have to get CT of the head approved and scheduled. And if she is on palliative care I would dicuss with the team treating her pain more aggressively. Let me know about the CT of the head or if they prefer to go to the ED if she is in such agony that she yells out.

## 2020-03-30 NOTE — Telephone Encounter (Signed)
Pt.'s daughter [name] is on [place]. She states she has an emergent request for Dr. Jaynee Eagles & would like to speak with her regarding mom. Please advise.

## 2020-03-30 NOTE — Telephone Encounter (Signed)
I called Vickie Young back and had to LVM (as requested prn by daughter). I advised her I would place a hold and notify our imaging dept until I speak with her. Left office number and hours in message.

## 2020-03-30 NOTE — Telephone Encounter (Signed)
Medicare/cigna order sent to GI. They will reach out to the patient to schedule.

## 2020-03-30 NOTE — Telephone Encounter (Signed)
Pt's daughter, Ritisha Deitrick, do not order CT Scan. Will give details when you call.

## 2020-03-30 NOTE — Telephone Encounter (Signed)
Spoke with the pt's daughter. She stated that since last month the patient has been experiencing headaches that come in episodes randomly at anytime of the day or night. The location of the headaches are R temple, forehead, or across top of head. The episodes can last 25 minutes or even hours. The patient moans and wails and may repeat "alright" or "oh Lord" multiple times. She has recorded the crying out. She is given Tylenol rapid release 500 mg (most recently received 1500 mg) up to 6 a day. The headache episodes are almost daily. The daughter states in the last few weeks she has also worsened in her mobility and cannot stand on her own. Since October she has had episodes of checking out, vacant looks at a short duration. This usually happens while sitting up. The daughter expresses concern for mini strokes and requests an MRI brain. She also noted the patient is now on palliative care and is seen by the nurse every 2 weeks. Their next meeting is today at 2:30 pm. The palliative nurse is aware of the episodes. I let her know I would discuss with Dr Jaynee Eagles.

## 2020-03-31 NOTE — Telephone Encounter (Signed)
Tried to reach [place] again. LVM asking for call back to discuss pt's CT head. Advised since the scan had already been ordered, the order was sent to GI. Left office number in message for return call.

## 2020-04-01 NOTE — Telephone Encounter (Signed)
Noted, thank you

## 2020-04-01 NOTE — Telephone Encounter (Signed)
Tried to reach [place] again but did not receive an answer.

## 2020-04-03 ENCOUNTER — Telehealth: Payer: Self-pay | Admitting: Neurology

## 2020-04-03 DIAGNOSIS — E785 Hyperlipidemia, unspecified: Secondary | ICD-10-CM | POA: Diagnosis not present

## 2020-04-03 DIAGNOSIS — H409 Unspecified glaucoma: Secondary | ICD-10-CM | POA: Diagnosis not present

## 2020-04-03 DIAGNOSIS — I69318 Other symptoms and signs involving cognitive functions following cerebral infarction: Secondary | ICD-10-CM | POA: Diagnosis not present

## 2020-04-03 DIAGNOSIS — N3281 Overactive bladder: Secondary | ICD-10-CM | POA: Diagnosis not present

## 2020-04-03 DIAGNOSIS — F32A Depression, unspecified: Secondary | ICD-10-CM | POA: Diagnosis not present

## 2020-04-03 DIAGNOSIS — F0150 Vascular dementia without behavioral disturbance: Secondary | ICD-10-CM | POA: Diagnosis not present

## 2020-04-03 DIAGNOSIS — Z681 Body mass index (BMI) 19 or less, adult: Secondary | ICD-10-CM | POA: Diagnosis not present

## 2020-04-03 DIAGNOSIS — I2510 Atherosclerotic heart disease of native coronary artery without angina pectoris: Secondary | ICD-10-CM | POA: Diagnosis not present

## 2020-04-03 DIAGNOSIS — I10 Essential (primary) hypertension: Secondary | ICD-10-CM | POA: Diagnosis not present

## 2020-04-03 DIAGNOSIS — Z87440 Personal history of urinary (tract) infections: Secondary | ICD-10-CM | POA: Diagnosis not present

## 2020-04-03 NOTE — Telephone Encounter (Signed)
Pt's daughter, [place] (on [place]) called, have scheduled CT Scan for Monday.

## 2020-04-05 DIAGNOSIS — I10 Essential (primary) hypertension: Secondary | ICD-10-CM | POA: Diagnosis not present

## 2020-04-05 DIAGNOSIS — E785 Hyperlipidemia, unspecified: Secondary | ICD-10-CM | POA: Diagnosis not present

## 2020-04-05 DIAGNOSIS — F32A Depression, unspecified: Secondary | ICD-10-CM | POA: Diagnosis not present

## 2020-04-05 DIAGNOSIS — I2510 Atherosclerotic heart disease of native coronary artery without angina pectoris: Secondary | ICD-10-CM | POA: Diagnosis not present

## 2020-04-05 DIAGNOSIS — F0150 Vascular dementia without behavioral disturbance: Secondary | ICD-10-CM | POA: Diagnosis not present

## 2020-04-05 DIAGNOSIS — I69318 Other symptoms and signs involving cognitive functions following cerebral infarction: Secondary | ICD-10-CM | POA: Diagnosis not present

## 2020-04-06 ENCOUNTER — Ambulatory Visit
Admission: RE | Admit: 2020-04-06 | Discharge: 2020-04-06 | Disposition: A | Discharge status: Home / Self Care | Payer: Medicare Other | Source: Ambulatory Visit | Attending: Neurology | Admitting: Neurology

## 2020-04-06 DIAGNOSIS — R519 Headache, unspecified: Secondary | ICD-10-CM

## 2020-04-06 NOTE — Telephone Encounter (Signed)
Noted thanks °

## 2020-04-07 DIAGNOSIS — I2510 Atherosclerotic heart disease of native coronary artery without angina pectoris: Secondary | ICD-10-CM | POA: Diagnosis not present

## 2020-04-07 DIAGNOSIS — I10 Essential (primary) hypertension: Secondary | ICD-10-CM | POA: Diagnosis not present

## 2020-04-07 DIAGNOSIS — F0150 Vascular dementia without behavioral disturbance: Secondary | ICD-10-CM | POA: Diagnosis not present

## 2020-04-07 DIAGNOSIS — I69318 Other symptoms and signs involving cognitive functions following cerebral infarction: Secondary | ICD-10-CM | POA: Diagnosis not present

## 2020-04-07 DIAGNOSIS — F32A Depression, unspecified: Secondary | ICD-10-CM | POA: Diagnosis not present

## 2020-04-07 DIAGNOSIS — E785 Hyperlipidemia, unspecified: Secondary | ICD-10-CM | POA: Diagnosis not present

## 2020-04-08 ENCOUNTER — Telehealth: Payer: Self-pay | Admitting: *Deleted

## 2020-04-08 NOTE — Telephone Encounter (Addendum)
I called the pt's daughter Vickie Young and discussed the pt's CT head results. She verbalized understanding and appreciation. She updated me that the patient has now been transitioned to home hospice. She cannot ambulate, sit up, or feed herself. She can only have some baby food/puree and is having difficulty swallowing. They are now focusing on her comfort. She yells and is not communicating with family. She currently has an appt w/ Megan NP for 04/27/20 at 11:00. [name] said she would be in touch after [place]. May switch to [place] verbalized appreciation for the call and her questions were answered.   Dr Jaynee Eagles aware pt is now on hospice.

## 2020-04-08 NOTE — Telephone Encounter (Signed)
-----   Message from Melvenia Beam, MD sent at 04/06/2020  5:07 PM EST ----- No acute findings and no definite change compared to the 11/09/2019 CT.

## 2020-04-10 DIAGNOSIS — I10 Essential (primary) hypertension: Secondary | ICD-10-CM | POA: Diagnosis not present

## 2020-04-10 DIAGNOSIS — I69318 Other symptoms and signs involving cognitive functions following cerebral infarction: Secondary | ICD-10-CM | POA: Diagnosis not present

## 2020-04-10 DIAGNOSIS — F32A Depression, unspecified: Secondary | ICD-10-CM | POA: Diagnosis not present

## 2020-04-10 DIAGNOSIS — E785 Hyperlipidemia, unspecified: Secondary | ICD-10-CM | POA: Diagnosis not present

## 2020-04-10 DIAGNOSIS — F0150 Vascular dementia without behavioral disturbance: Secondary | ICD-10-CM | POA: Diagnosis not present

## 2020-04-10 DIAGNOSIS — I2510 Atherosclerotic heart disease of native coronary artery without angina pectoris: Secondary | ICD-10-CM | POA: Diagnosis not present

## 2020-04-13 DIAGNOSIS — F32A Depression, unspecified: Secondary | ICD-10-CM | POA: Diagnosis not present

## 2020-04-13 DIAGNOSIS — I2510 Atherosclerotic heart disease of native coronary artery without angina pectoris: Secondary | ICD-10-CM | POA: Diagnosis not present

## 2020-04-13 DIAGNOSIS — I10 Essential (primary) hypertension: Secondary | ICD-10-CM | POA: Diagnosis not present

## 2020-04-13 DIAGNOSIS — F0150 Vascular dementia without behavioral disturbance: Secondary | ICD-10-CM | POA: Diagnosis not present

## 2020-04-13 DIAGNOSIS — I69318 Other symptoms and signs involving cognitive functions following cerebral infarction: Secondary | ICD-10-CM | POA: Diagnosis not present

## 2020-04-13 DIAGNOSIS — E785 Hyperlipidemia, unspecified: Secondary | ICD-10-CM | POA: Diagnosis not present

## 2020-04-16 DIAGNOSIS — E785 Hyperlipidemia, unspecified: Secondary | ICD-10-CM | POA: Diagnosis not present

## 2020-04-16 DIAGNOSIS — I2510 Atherosclerotic heart disease of native coronary artery without angina pectoris: Secondary | ICD-10-CM | POA: Diagnosis not present

## 2020-04-16 DIAGNOSIS — I10 Essential (primary) hypertension: Secondary | ICD-10-CM | POA: Diagnosis not present

## 2020-04-16 DIAGNOSIS — F32A Depression, unspecified: Secondary | ICD-10-CM | POA: Diagnosis not present

## 2020-04-16 DIAGNOSIS — I69318 Other symptoms and signs involving cognitive functions following cerebral infarction: Secondary | ICD-10-CM | POA: Diagnosis not present

## 2020-04-16 DIAGNOSIS — F0150 Vascular dementia without behavioral disturbance: Secondary | ICD-10-CM | POA: Diagnosis not present

## 2020-04-17 DIAGNOSIS — I2510 Atherosclerotic heart disease of native coronary artery without angina pectoris: Secondary | ICD-10-CM | POA: Diagnosis not present

## 2020-04-17 DIAGNOSIS — I10 Essential (primary) hypertension: Secondary | ICD-10-CM | POA: Diagnosis not present

## 2020-04-17 DIAGNOSIS — F32A Depression, unspecified: Secondary | ICD-10-CM | POA: Diagnosis not present

## 2020-04-17 DIAGNOSIS — I69318 Other symptoms and signs involving cognitive functions following cerebral infarction: Secondary | ICD-10-CM | POA: Diagnosis not present

## 2020-04-17 DIAGNOSIS — F0150 Vascular dementia without behavioral disturbance: Secondary | ICD-10-CM | POA: Diagnosis not present

## 2020-04-17 DIAGNOSIS — E785 Hyperlipidemia, unspecified: Secondary | ICD-10-CM | POA: Diagnosis not present

## 2020-04-18 DIAGNOSIS — F32A Depression, unspecified: Secondary | ICD-10-CM | POA: Diagnosis not present

## 2020-04-18 DIAGNOSIS — Z681 Body mass index (BMI) 19 or less, adult: Secondary | ICD-10-CM | POA: Diagnosis not present

## 2020-04-18 DIAGNOSIS — I2510 Atherosclerotic heart disease of native coronary artery without angina pectoris: Secondary | ICD-10-CM | POA: Diagnosis not present

## 2020-04-18 DIAGNOSIS — F0150 Vascular dementia without behavioral disturbance: Secondary | ICD-10-CM | POA: Diagnosis not present

## 2020-04-18 DIAGNOSIS — N3281 Overactive bladder: Secondary | ICD-10-CM | POA: Diagnosis not present

## 2020-04-18 DIAGNOSIS — H409 Unspecified glaucoma: Secondary | ICD-10-CM | POA: Diagnosis not present

## 2020-04-18 DIAGNOSIS — I69318 Other symptoms and signs involving cognitive functions following cerebral infarction: Secondary | ICD-10-CM | POA: Diagnosis not present

## 2020-04-18 DIAGNOSIS — I10 Essential (primary) hypertension: Secondary | ICD-10-CM | POA: Diagnosis not present

## 2020-04-18 DIAGNOSIS — E785 Hyperlipidemia, unspecified: Secondary | ICD-10-CM | POA: Diagnosis not present

## 2020-04-18 DIAGNOSIS — Z87440 Personal history of urinary (tract) infections: Secondary | ICD-10-CM | POA: Diagnosis not present

## 2020-04-19 DIAGNOSIS — E785 Hyperlipidemia, unspecified: Secondary | ICD-10-CM | POA: Diagnosis not present

## 2020-04-19 DIAGNOSIS — I2510 Atherosclerotic heart disease of native coronary artery without angina pectoris: Secondary | ICD-10-CM | POA: Diagnosis not present

## 2020-04-19 DIAGNOSIS — F32A Depression, unspecified: Secondary | ICD-10-CM | POA: Diagnosis not present

## 2020-04-19 DIAGNOSIS — F0150 Vascular dementia without behavioral disturbance: Secondary | ICD-10-CM | POA: Diagnosis not present

## 2020-04-19 DIAGNOSIS — I10 Essential (primary) hypertension: Secondary | ICD-10-CM | POA: Diagnosis not present

## 2020-04-19 DIAGNOSIS — I69318 Other symptoms and signs involving cognitive functions following cerebral infarction: Secondary | ICD-10-CM | POA: Diagnosis not present

## 2020-04-20 ENCOUNTER — Telehealth: Payer: Self-pay | Admitting: Neurology

## 2020-04-20 DIAGNOSIS — I2510 Atherosclerotic heart disease of native coronary artery without angina pectoris: Secondary | ICD-10-CM | POA: Diagnosis not present

## 2020-04-20 DIAGNOSIS — F32A Depression, unspecified: Secondary | ICD-10-CM | POA: Diagnosis not present

## 2020-04-20 DIAGNOSIS — I69318 Other symptoms and signs involving cognitive functions following cerebral infarction: Secondary | ICD-10-CM | POA: Diagnosis not present

## 2020-04-20 DIAGNOSIS — I10 Essential (primary) hypertension: Secondary | ICD-10-CM | POA: Diagnosis not present

## 2020-04-20 DIAGNOSIS — F0150 Vascular dementia without behavioral disturbance: Secondary | ICD-10-CM | POA: Diagnosis not present

## 2020-04-20 DIAGNOSIS — E785 Hyperlipidemia, unspecified: Secondary | ICD-10-CM | POA: Diagnosis not present

## 2020-04-20 NOTE — Telephone Encounter (Signed)
[place],barbara(daughter)called to report pt passed this morning.  She would like a call from [facility] when she is able.

## 2020-04-21 NOTE — Telephone Encounter (Signed)
Called Vickie Young back and LVM (ok per DPR) offering my condolences. Advised we will be open until 5 pm. Will try to reach her again tomorrow if I do not hear from her today.

## 2020-04-22 NOTE — Telephone Encounter (Signed)
I was able to speak with Vickie Young. She said the funeral is on Sunday. The patient was under home hospice care x 3 weeks. Vickie Young wanted to be sure Dr Lucia Gaskins was notified of her sincere appreciation for the care her mother received here. She had the "best feeling of support" from our office and felt that things were attended to quickly. She verbalized appreciation for the call.   She confirmed the address is still the same as we will send out a sympathy card.

## 2020-04-27 ENCOUNTER — Ambulatory Visit: Payer: Medicare Other | Admitting: Adult Health

## 2020-05-19 DEATH — deceased

## 2021-08-26 IMAGING — DX DG WRIST 2V*L*
1 series · 2 of 2 positions shown · non-contrast
Comparison: None.

CLINICAL DATA: Arm injury.  Pain

EXAM:
LEFT WRIST - 2 VIEW

[Series 1: wrist · 0.14mm/px · 2 of 2 slices shown]
[im 1/2]
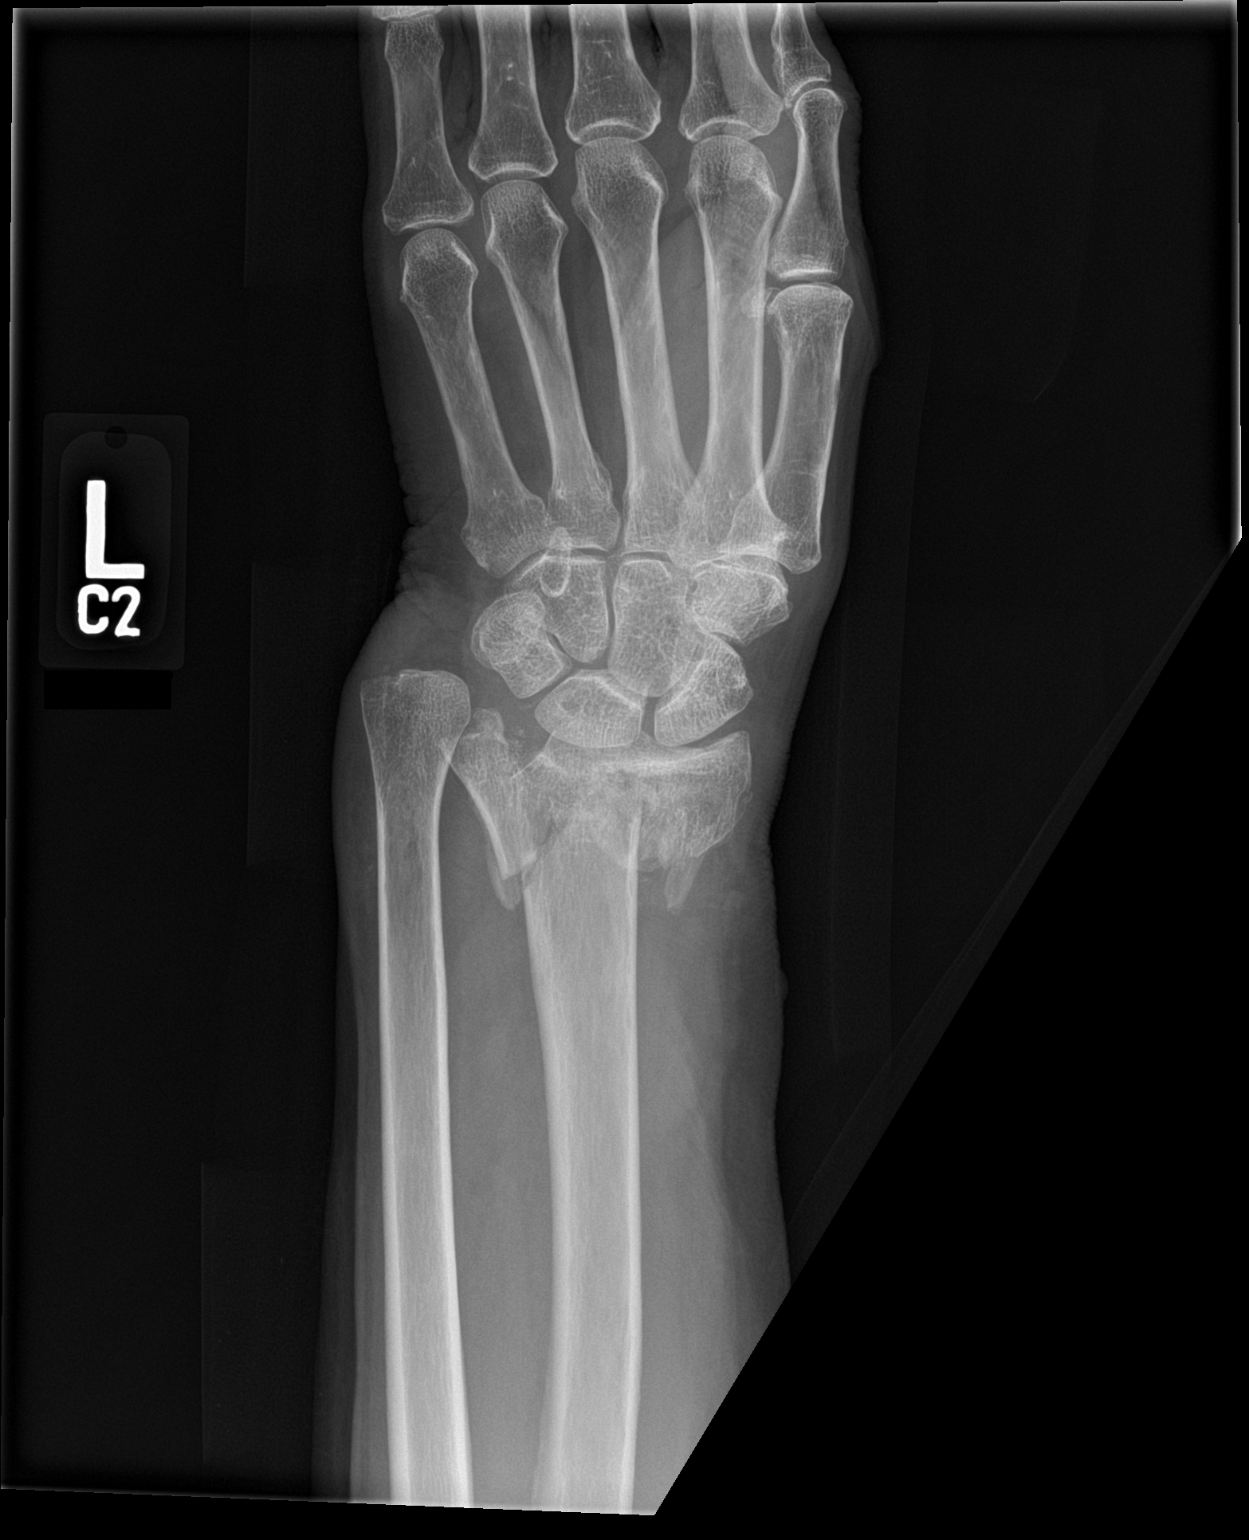
[im 2/2]
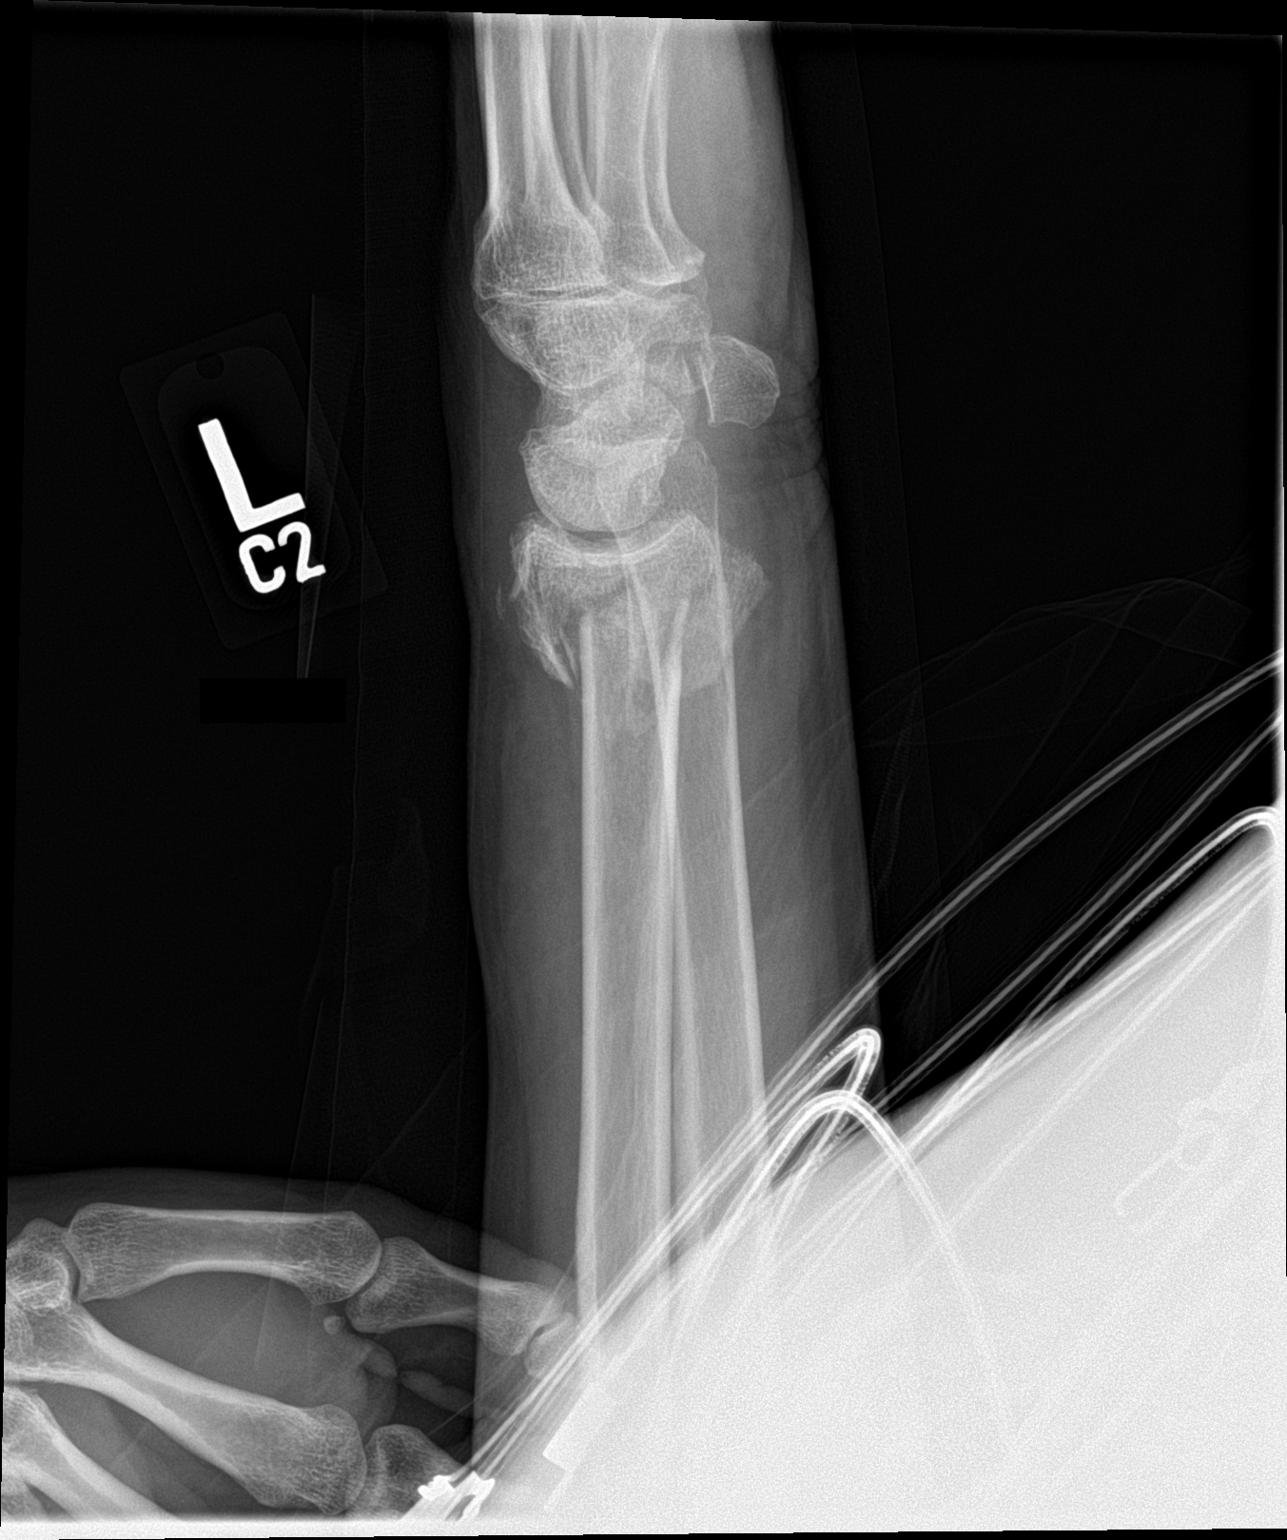

[2 of 2 positions shown; findings below may reference images not displayed]

FINDINGS: Severely comminuted fracture distal radius. Fracture extends into
the radiocarpal joint with multiple displaced fragments.
Foreshortening of the distal radius. Possible tiny avulsion fracture
of the ulnar styloid. No fracture in the carpal bones.
IMPRESSION: Severely comminuted fracture distal radius.

## 2021-08-27 IMAGING — DX DG SHOULDER 1V*L*
4 series · 8 of 10 positions shown · non-contrast
Comparison: Shoulder radiograph 12/28/2018

CLINICAL DATA: Postoperative pain.

EXAM:
LEFT SHOULDER - 1 VIEW

[Series 1: shoulder · 0.14mm/px · 2 of 3 slices shown (1 of 4)]
[im 1/3]
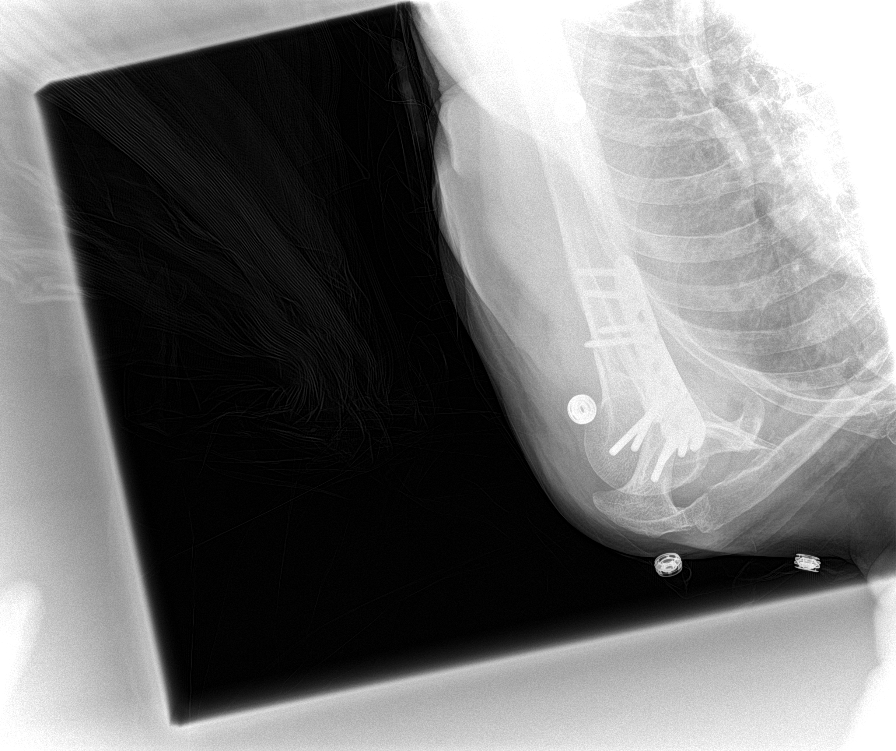
[im 3/3]
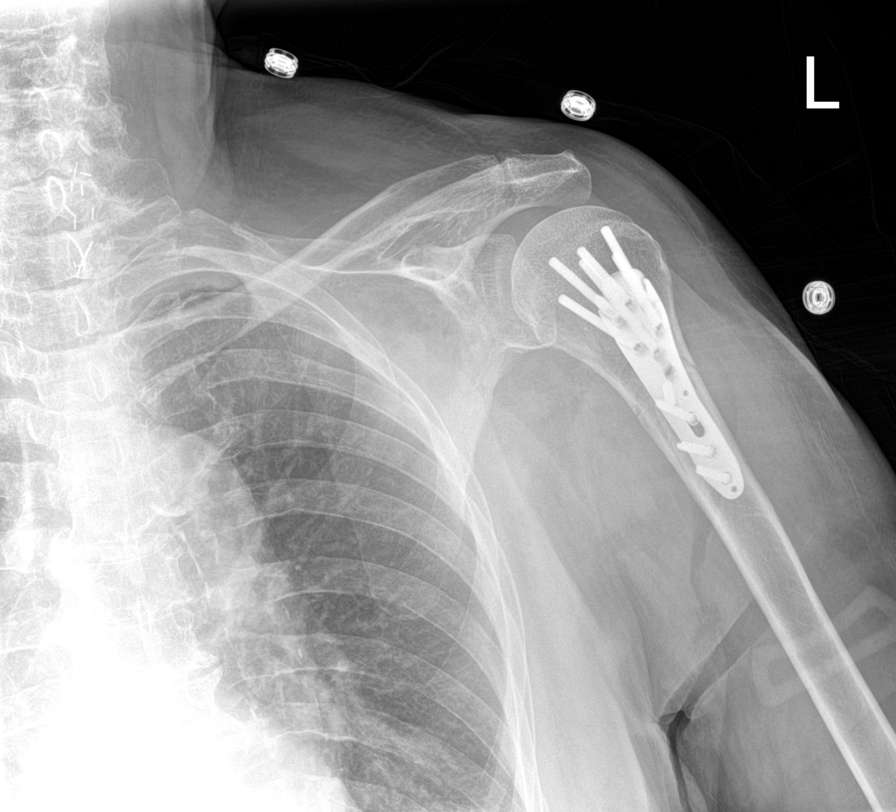

[Series 1: shoulder · 0.14mm/px · 2 of 2 slices shown (2 of 4)]
[im 1/2]
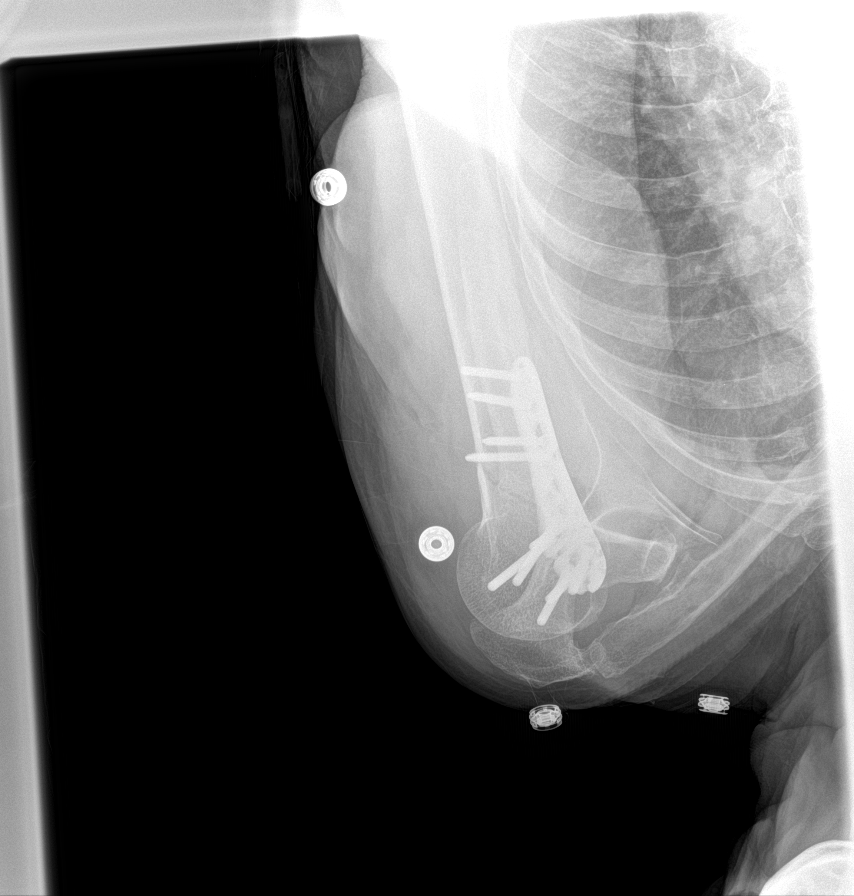
[im 2/2]
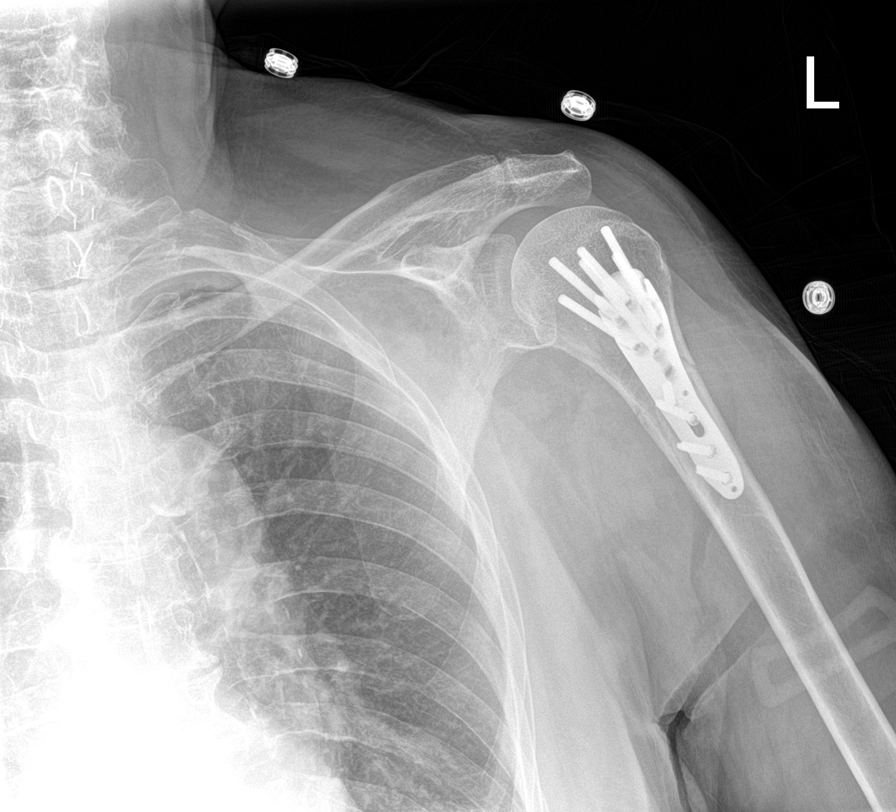

[shoulder (3 of 4)]
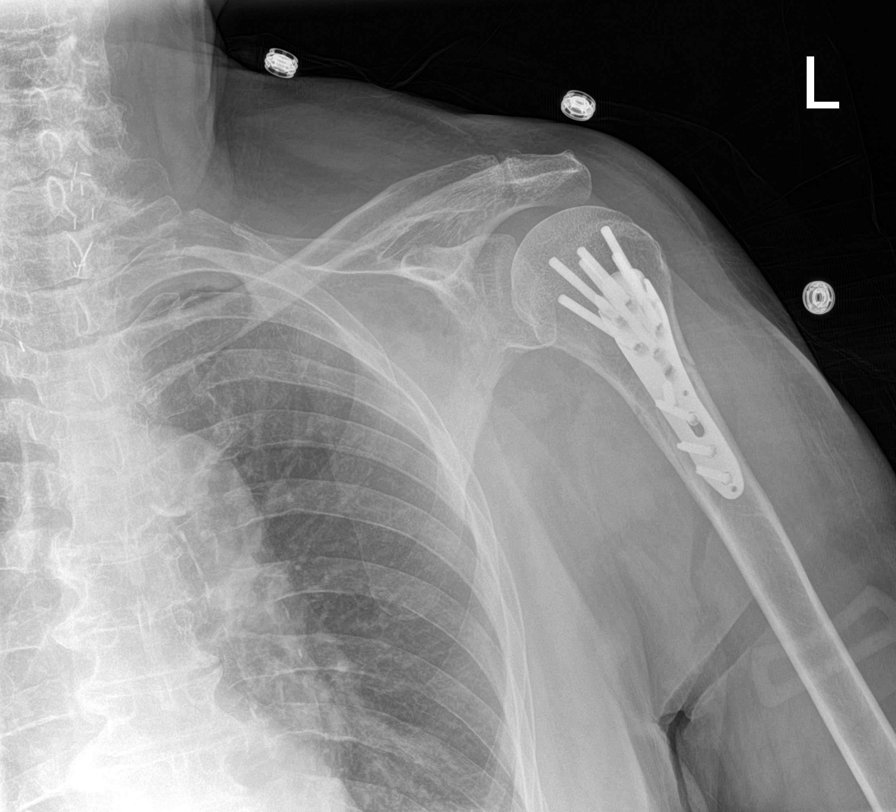

[Series 1: shoulder · 0.14mm/px · 3 of 7 slices shown (4 of 4)]
[im 1/7]
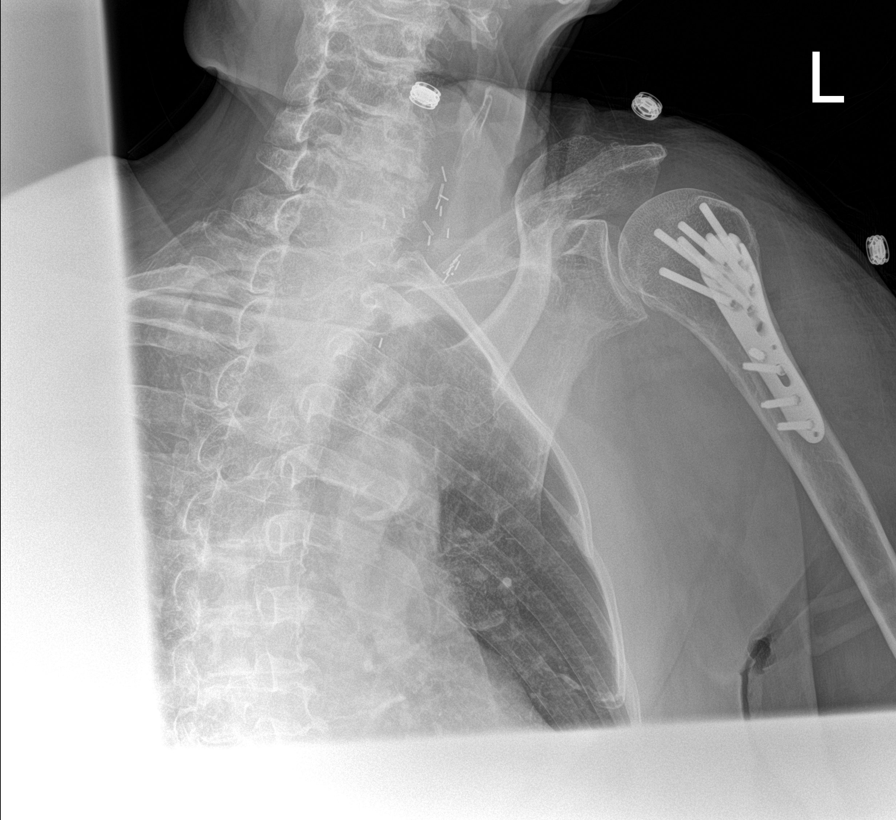
[im 2/7]
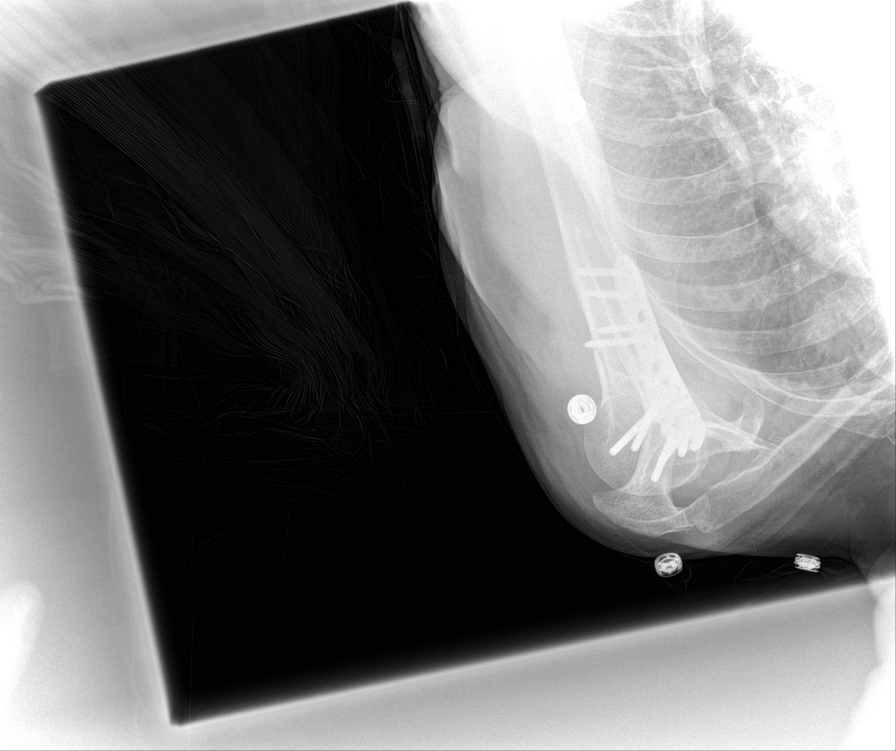
[im 4/7]
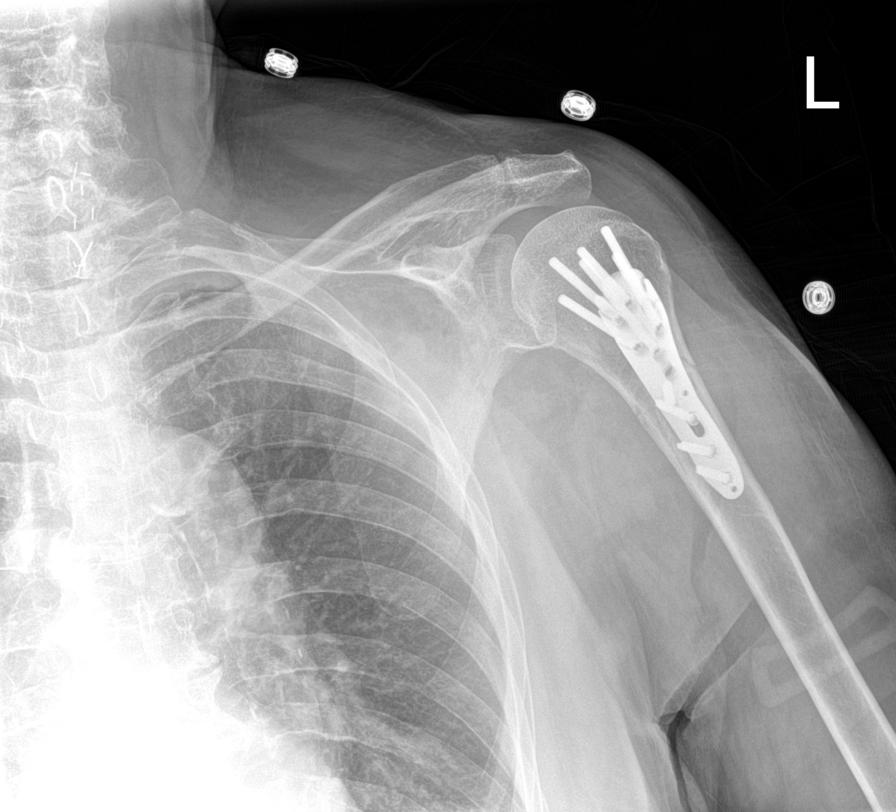

[8 of 10 positions shown; findings below may reference images not displayed]

FINDINGS: Plate screw fixation of the proximal left humerus. Fracture
fragments appear improved anatomic alignment. Left AC joint
degenerative changes. Left hemithorax unremarkable.
IMPRESSION: Improved anatomic alignment of the proximal left humerus status post
ORIF with plate and screw fixation.
